# Patient Record
Sex: Male | Born: 1940
Health system: Southern US, Community
[De-identification: ages and names within clinical notes are randomized; demographics above are authoritative.]

## PROBLEM LIST (undated history)

## (undated) DIAGNOSIS — I10 Essential (primary) hypertension: Secondary | ICD-10-CM

## (undated) DIAGNOSIS — C61 Malignant neoplasm of prostate: Secondary | ICD-10-CM

## (undated) DIAGNOSIS — M549 Dorsalgia, unspecified: Secondary | ICD-10-CM

## (undated) DIAGNOSIS — K635 Polyp of colon: Secondary | ICD-10-CM

## (undated) DIAGNOSIS — E785 Hyperlipidemia, unspecified: Secondary | ICD-10-CM

## (undated) DIAGNOSIS — I219 Acute myocardial infarction, unspecified: Secondary | ICD-10-CM

## (undated) DIAGNOSIS — M722 Plantar fascial fibromatosis: Secondary | ICD-10-CM

## (undated) DIAGNOSIS — J449 Chronic obstructive pulmonary disease, unspecified: Secondary | ICD-10-CM

## (undated) DIAGNOSIS — G4700 Insomnia, unspecified: Secondary | ICD-10-CM

## (undated) DIAGNOSIS — F419 Anxiety disorder, unspecified: Secondary | ICD-10-CM

## (undated) DIAGNOSIS — I259 Chronic ischemic heart disease, unspecified: Secondary | ICD-10-CM

## (undated) DIAGNOSIS — J45909 Unspecified asthma, uncomplicated: Secondary | ICD-10-CM

## (undated) DIAGNOSIS — M109 Gout, unspecified: Secondary | ICD-10-CM

## (undated) DIAGNOSIS — K219 Gastro-esophageal reflux disease without esophagitis: Secondary | ICD-10-CM

## (undated) DIAGNOSIS — M542 Cervicalgia: Secondary | ICD-10-CM

## (undated) DIAGNOSIS — K649 Unspecified hemorrhoids: Secondary | ICD-10-CM

## (undated) DIAGNOSIS — I4891 Unspecified atrial fibrillation: Secondary | ICD-10-CM

## (undated) HISTORY — DX: Gastro-esophageal reflux disease without esophagitis: K21.9

## (undated) HISTORY — DX: Gout, unspecified: M10.9

## (undated) HISTORY — DX: Chronic ischemic heart disease, unspecified: I25.9

## (undated) HISTORY — DX: Anxiety disorder, unspecified: F41.9

## (undated) HISTORY — DX: Malignant neoplasm of prostate: C61

## (undated) HISTORY — DX: Acute myocardial infarction, unspecified: I21.9

## (undated) HISTORY — DX: Plantar fascial fibromatosis: M72.2

## (undated) HISTORY — DX: Unspecified asthma, uncomplicated: J45.909

## (undated) HISTORY — DX: Essential (primary) hypertension: I10

## (undated) HISTORY — DX: Unspecified atrial fibrillation: I48.91

## (undated) HISTORY — DX: Dorsalgia, unspecified: M54.9

## (undated) HISTORY — PX: COLONOSCOPY: SHX174

## (undated) HISTORY — DX: Hyperlipidemia, unspecified: E78.5

## (undated) HISTORY — DX: Chronic obstructive pulmonary disease, unspecified: J44.9

## (undated) HISTORY — DX: Cervicalgia: M54.2

---

## 1999-03-06 HISTORY — PX: PROSTATECTOMY: SHX69

## 2001-03-05 DIAGNOSIS — I252 Old myocardial infarction: Secondary | ICD-10-CM

## 2001-03-05 DIAGNOSIS — I219 Acute myocardial infarction, unspecified: Secondary | ICD-10-CM

## 2001-03-05 HISTORY — DX: Acute myocardial infarction, unspecified: I21.9

## 2001-05-27 ENCOUNTER — Ambulatory Visit (HOSPITAL_COMMUNITY): Admission: RE | Admit: 2001-05-27 | Discharge: 2001-05-27 | Payer: Self-pay | Admitting: Cardiology

## 2002-10-19 ENCOUNTER — Inpatient Hospital Stay (HOSPITAL_COMMUNITY): Admission: EM | Admit: 2002-10-19 | Discharge: 2002-10-27 | Payer: Self-pay | Admitting: Internal Medicine

## 2003-06-01 ENCOUNTER — Inpatient Hospital Stay (HOSPITAL_COMMUNITY): Admission: RE | Admit: 2003-06-01 | Discharge: 2003-06-05 | Payer: Self-pay | Admitting: Urology

## 2005-03-05 HISTORY — PX: CORONARY ANGIOPLASTY WITH STENT PLACEMENT: SHX49

## 2015-03-18 DIAGNOSIS — E7800 Pure hypercholesterolemia, unspecified: Secondary | ICD-10-CM | POA: Diagnosis not present

## 2015-03-18 DIAGNOSIS — I259 Chronic ischemic heart disease, unspecified: Secondary | ICD-10-CM | POA: Diagnosis not present

## 2015-03-18 DIAGNOSIS — J449 Chronic obstructive pulmonary disease, unspecified: Secondary | ICD-10-CM | POA: Diagnosis not present

## 2015-03-18 DIAGNOSIS — Z6829 Body mass index (BMI) 29.0-29.9, adult: Secondary | ICD-10-CM | POA: Diagnosis not present

## 2015-03-31 DIAGNOSIS — L859 Epidermal thickening, unspecified: Secondary | ICD-10-CM | POA: Diagnosis not present

## 2015-03-31 DIAGNOSIS — L853 Xerosis cutis: Secondary | ICD-10-CM | POA: Diagnosis not present

## 2015-03-31 DIAGNOSIS — D2330 Other benign neoplasm of skin of unspecified part of face: Secondary | ICD-10-CM | POA: Diagnosis not present

## 2015-04-21 DIAGNOSIS — Z86010 Personal history of colonic polyps: Secondary | ICD-10-CM | POA: Diagnosis not present

## 2015-05-04 DIAGNOSIS — I252 Old myocardial infarction: Secondary | ICD-10-CM | POA: Diagnosis not present

## 2015-05-04 DIAGNOSIS — Z8546 Personal history of malignant neoplasm of prostate: Secondary | ICD-10-CM | POA: Diagnosis not present

## 2015-05-04 DIAGNOSIS — Z807 Family history of other malignant neoplasms of lymphoid, hematopoietic and related tissues: Secondary | ICD-10-CM | POA: Diagnosis not present

## 2015-05-04 DIAGNOSIS — K5731 Diverticulosis of large intestine without perforation or abscess with bleeding: Secondary | ICD-10-CM | POA: Diagnosis not present

## 2015-05-04 DIAGNOSIS — Z86010 Personal history of colonic polyps: Secondary | ICD-10-CM | POA: Diagnosis not present

## 2015-05-04 DIAGNOSIS — Z79899 Other long term (current) drug therapy: Secondary | ICD-10-CM | POA: Diagnosis not present

## 2015-05-04 DIAGNOSIS — E785 Hyperlipidemia, unspecified: Secondary | ICD-10-CM | POA: Diagnosis not present

## 2015-05-04 DIAGNOSIS — M109 Gout, unspecified: Secondary | ICD-10-CM | POA: Diagnosis not present

## 2015-05-04 DIAGNOSIS — Z955 Presence of coronary angioplasty implant and graft: Secondary | ICD-10-CM | POA: Diagnosis not present

## 2015-05-04 DIAGNOSIS — I10 Essential (primary) hypertension: Secondary | ICD-10-CM | POA: Diagnosis not present

## 2015-05-04 DIAGNOSIS — J45909 Unspecified asthma, uncomplicated: Secondary | ICD-10-CM | POA: Diagnosis not present

## 2015-05-04 DIAGNOSIS — Z7982 Long term (current) use of aspirin: Secondary | ICD-10-CM | POA: Diagnosis not present

## 2015-05-04 DIAGNOSIS — I2510 Atherosclerotic heart disease of native coronary artery without angina pectoris: Secondary | ICD-10-CM | POA: Diagnosis not present

## 2015-05-04 DIAGNOSIS — Z1211 Encounter for screening for malignant neoplasm of colon: Secondary | ICD-10-CM | POA: Diagnosis not present

## 2015-05-04 DIAGNOSIS — F419 Anxiety disorder, unspecified: Secondary | ICD-10-CM | POA: Diagnosis not present

## 2015-05-04 DIAGNOSIS — Z8489 Family history of other specified conditions: Secondary | ICD-10-CM | POA: Diagnosis not present

## 2015-05-04 DIAGNOSIS — K644 Residual hemorrhoidal skin tags: Secondary | ICD-10-CM | POA: Diagnosis not present

## 2015-05-04 DIAGNOSIS — Z800 Family history of malignant neoplasm of digestive organs: Secondary | ICD-10-CM | POA: Diagnosis not present

## 2015-05-04 DIAGNOSIS — D122 Benign neoplasm of ascending colon: Secondary | ICD-10-CM | POA: Diagnosis not present

## 2015-05-04 DIAGNOSIS — D120 Benign neoplasm of cecum: Secondary | ICD-10-CM | POA: Diagnosis not present

## 2015-05-04 DIAGNOSIS — K5730 Diverticulosis of large intestine without perforation or abscess without bleeding: Secondary | ICD-10-CM | POA: Diagnosis not present

## 2015-05-04 DIAGNOSIS — Z7951 Long term (current) use of inhaled steroids: Secondary | ICD-10-CM | POA: Diagnosis not present

## 2015-05-04 DIAGNOSIS — D124 Benign neoplasm of descending colon: Secondary | ICD-10-CM | POA: Diagnosis not present

## 2015-05-04 DIAGNOSIS — K642 Third degree hemorrhoids: Secondary | ICD-10-CM | POA: Diagnosis not present

## 2015-05-04 DIAGNOSIS — D123 Benign neoplasm of transverse colon: Secondary | ICD-10-CM | POA: Diagnosis not present

## 2015-06-29 DIAGNOSIS — J449 Chronic obstructive pulmonary disease, unspecified: Secondary | ICD-10-CM | POA: Diagnosis not present

## 2015-06-29 DIAGNOSIS — I10 Essential (primary) hypertension: Secondary | ICD-10-CM | POA: Diagnosis not present

## 2015-06-29 DIAGNOSIS — I2510 Atherosclerotic heart disease of native coronary artery without angina pectoris: Secondary | ICD-10-CM | POA: Diagnosis not present

## 2015-06-29 DIAGNOSIS — I4891 Unspecified atrial fibrillation: Secondary | ICD-10-CM | POA: Diagnosis not present

## 2015-08-24 DIAGNOSIS — I2510 Atherosclerotic heart disease of native coronary artery without angina pectoris: Secondary | ICD-10-CM | POA: Diagnosis not present

## 2015-08-24 DIAGNOSIS — J449 Chronic obstructive pulmonary disease, unspecified: Secondary | ICD-10-CM | POA: Diagnosis not present

## 2015-08-24 DIAGNOSIS — I4891 Unspecified atrial fibrillation: Secondary | ICD-10-CM | POA: Diagnosis not present

## 2015-08-24 DIAGNOSIS — I10 Essential (primary) hypertension: Secondary | ICD-10-CM | POA: Diagnosis not present

## 2015-09-13 DIAGNOSIS — I2510 Atherosclerotic heart disease of native coronary artery without angina pectoris: Secondary | ICD-10-CM | POA: Diagnosis not present

## 2015-09-13 DIAGNOSIS — I10 Essential (primary) hypertension: Secondary | ICD-10-CM | POA: Diagnosis not present

## 2015-09-13 DIAGNOSIS — J449 Chronic obstructive pulmonary disease, unspecified: Secondary | ICD-10-CM | POA: Diagnosis not present

## 2015-09-13 DIAGNOSIS — I4891 Unspecified atrial fibrillation: Secondary | ICD-10-CM | POA: Diagnosis not present

## 2015-09-21 DIAGNOSIS — Z1211 Encounter for screening for malignant neoplasm of colon: Secondary | ICD-10-CM | POA: Diagnosis not present

## 2015-09-21 DIAGNOSIS — Z1389 Encounter for screening for other disorder: Secondary | ICD-10-CM | POA: Diagnosis not present

## 2015-09-21 DIAGNOSIS — Z299 Encounter for prophylactic measures, unspecified: Secondary | ICD-10-CM | POA: Diagnosis not present

## 2015-09-21 DIAGNOSIS — Z6828 Body mass index (BMI) 28.0-28.9, adult: Secondary | ICD-10-CM | POA: Diagnosis not present

## 2015-09-21 DIAGNOSIS — J449 Chronic obstructive pulmonary disease, unspecified: Secondary | ICD-10-CM | POA: Diagnosis not present

## 2015-09-21 DIAGNOSIS — Z7189 Other specified counseling: Secondary | ICD-10-CM | POA: Diagnosis not present

## 2015-09-21 DIAGNOSIS — R5383 Other fatigue: Secondary | ICD-10-CM | POA: Diagnosis not present

## 2015-09-21 DIAGNOSIS — Z125 Encounter for screening for malignant neoplasm of prostate: Secondary | ICD-10-CM | POA: Diagnosis not present

## 2015-09-21 DIAGNOSIS — Z0000 Encounter for general adult medical examination without abnormal findings: Secondary | ICD-10-CM | POA: Diagnosis not present

## 2015-09-21 DIAGNOSIS — Z79899 Other long term (current) drug therapy: Secondary | ICD-10-CM | POA: Diagnosis not present

## 2015-09-21 DIAGNOSIS — I517 Cardiomegaly: Secondary | ICD-10-CM | POA: Diagnosis not present

## 2015-09-21 DIAGNOSIS — E7800 Pure hypercholesterolemia, unspecified: Secondary | ICD-10-CM | POA: Diagnosis not present

## 2015-09-21 DIAGNOSIS — R079 Chest pain, unspecified: Secondary | ICD-10-CM | POA: Diagnosis not present

## 2015-10-26 DIAGNOSIS — I2510 Atherosclerotic heart disease of native coronary artery without angina pectoris: Secondary | ICD-10-CM | POA: Diagnosis not present

## 2015-10-26 DIAGNOSIS — I4891 Unspecified atrial fibrillation: Secondary | ICD-10-CM | POA: Diagnosis not present

## 2015-10-26 DIAGNOSIS — I10 Essential (primary) hypertension: Secondary | ICD-10-CM | POA: Diagnosis not present

## 2015-10-26 DIAGNOSIS — J449 Chronic obstructive pulmonary disease, unspecified: Secondary | ICD-10-CM | POA: Diagnosis not present

## 2015-12-01 DIAGNOSIS — Z23 Encounter for immunization: Secondary | ICD-10-CM | POA: Diagnosis not present

## 2015-12-02 DIAGNOSIS — I2510 Atherosclerotic heart disease of native coronary artery without angina pectoris: Secondary | ICD-10-CM | POA: Diagnosis not present

## 2015-12-02 DIAGNOSIS — J449 Chronic obstructive pulmonary disease, unspecified: Secondary | ICD-10-CM | POA: Diagnosis not present

## 2015-12-02 DIAGNOSIS — I4891 Unspecified atrial fibrillation: Secondary | ICD-10-CM | POA: Diagnosis not present

## 2015-12-02 DIAGNOSIS — I10 Essential (primary) hypertension: Secondary | ICD-10-CM | POA: Diagnosis not present

## 2015-12-21 DIAGNOSIS — I10 Essential (primary) hypertension: Secondary | ICD-10-CM | POA: Diagnosis not present

## 2015-12-21 DIAGNOSIS — J449 Chronic obstructive pulmonary disease, unspecified: Secondary | ICD-10-CM | POA: Diagnosis not present

## 2015-12-21 DIAGNOSIS — I2510 Atherosclerotic heart disease of native coronary artery without angina pectoris: Secondary | ICD-10-CM | POA: Diagnosis not present

## 2015-12-21 DIAGNOSIS — I4891 Unspecified atrial fibrillation: Secondary | ICD-10-CM | POA: Diagnosis not present

## 2015-12-22 DIAGNOSIS — I4891 Unspecified atrial fibrillation: Secondary | ICD-10-CM | POA: Diagnosis not present

## 2015-12-22 DIAGNOSIS — J449 Chronic obstructive pulmonary disease, unspecified: Secondary | ICD-10-CM | POA: Diagnosis not present

## 2015-12-22 DIAGNOSIS — I208 Other forms of angina pectoris: Secondary | ICD-10-CM | POA: Diagnosis not present

## 2015-12-22 DIAGNOSIS — Z6827 Body mass index (BMI) 27.0-27.9, adult: Secondary | ICD-10-CM | POA: Diagnosis not present

## 2015-12-22 DIAGNOSIS — Z299 Encounter for prophylactic measures, unspecified: Secondary | ICD-10-CM | POA: Diagnosis not present

## 2016-01-13 DIAGNOSIS — I4891 Unspecified atrial fibrillation: Secondary | ICD-10-CM | POA: Diagnosis not present

## 2016-01-13 DIAGNOSIS — J449 Chronic obstructive pulmonary disease, unspecified: Secondary | ICD-10-CM | POA: Diagnosis not present

## 2016-01-13 DIAGNOSIS — I2510 Atherosclerotic heart disease of native coronary artery without angina pectoris: Secondary | ICD-10-CM | POA: Diagnosis not present

## 2016-01-13 DIAGNOSIS — I10 Essential (primary) hypertension: Secondary | ICD-10-CM | POA: Diagnosis not present

## 2016-01-30 DIAGNOSIS — M25539 Pain in unspecified wrist: Secondary | ICD-10-CM | POA: Diagnosis not present

## 2016-07-20 DIAGNOSIS — I2510 Atherosclerotic heart disease of native coronary artery without angina pectoris: Secondary | ICD-10-CM | POA: Diagnosis not present

## 2016-07-20 DIAGNOSIS — Z87891 Personal history of nicotine dependence: Secondary | ICD-10-CM | POA: Diagnosis not present

## 2016-07-20 DIAGNOSIS — R0602 Shortness of breath: Secondary | ICD-10-CM | POA: Diagnosis not present

## 2016-07-20 DIAGNOSIS — Z8546 Personal history of malignant neoplasm of prostate: Secondary | ICD-10-CM | POA: Diagnosis not present

## 2016-07-20 DIAGNOSIS — Z7951 Long term (current) use of inhaled steroids: Secondary | ICD-10-CM | POA: Diagnosis not present

## 2016-07-20 DIAGNOSIS — Z79899 Other long term (current) drug therapy: Secondary | ICD-10-CM | POA: Diagnosis not present

## 2016-07-20 DIAGNOSIS — R05 Cough: Secondary | ICD-10-CM | POA: Diagnosis not present

## 2016-07-20 DIAGNOSIS — I4510 Unspecified right bundle-branch block: Secondary | ICD-10-CM | POA: Diagnosis not present

## 2016-07-20 DIAGNOSIS — Z7982 Long term (current) use of aspirin: Secondary | ICD-10-CM | POA: Diagnosis not present

## 2016-07-20 DIAGNOSIS — I10 Essential (primary) hypertension: Secondary | ICD-10-CM | POA: Diagnosis not present

## 2016-07-20 DIAGNOSIS — J441 Chronic obstructive pulmonary disease with (acute) exacerbation: Secondary | ICD-10-CM | POA: Diagnosis not present

## 2016-07-20 DIAGNOSIS — E785 Hyperlipidemia, unspecified: Secondary | ICD-10-CM | POA: Diagnosis not present

## 2016-07-20 DIAGNOSIS — I4891 Unspecified atrial fibrillation: Secondary | ICD-10-CM | POA: Diagnosis not present

## 2016-07-23 DIAGNOSIS — I4891 Unspecified atrial fibrillation: Secondary | ICD-10-CM | POA: Diagnosis not present

## 2016-07-23 DIAGNOSIS — J449 Chronic obstructive pulmonary disease, unspecified: Secondary | ICD-10-CM | POA: Diagnosis not present

## 2016-07-23 DIAGNOSIS — Z87891 Personal history of nicotine dependence: Secondary | ICD-10-CM | POA: Diagnosis not present

## 2016-07-23 DIAGNOSIS — E785 Hyperlipidemia, unspecified: Secondary | ICD-10-CM | POA: Diagnosis not present

## 2016-07-23 DIAGNOSIS — Z299 Encounter for prophylactic measures, unspecified: Secondary | ICD-10-CM | POA: Diagnosis not present

## 2016-07-23 DIAGNOSIS — J441 Chronic obstructive pulmonary disease with (acute) exacerbation: Secondary | ICD-10-CM | POA: Diagnosis not present

## 2016-07-23 DIAGNOSIS — R002 Palpitations: Secondary | ICD-10-CM | POA: Diagnosis not present

## 2016-07-23 DIAGNOSIS — I10 Essential (primary) hypertension: Secondary | ICD-10-CM | POA: Diagnosis not present

## 2016-07-23 DIAGNOSIS — Z713 Dietary counseling and surveillance: Secondary | ICD-10-CM | POA: Diagnosis not present

## 2016-07-23 DIAGNOSIS — Z6827 Body mass index (BMI) 27.0-27.9, adult: Secondary | ICD-10-CM | POA: Diagnosis not present

## 2016-07-25 DIAGNOSIS — C44319 Basal cell carcinoma of skin of other parts of face: Secondary | ICD-10-CM | POA: Diagnosis not present

## 2016-07-25 DIAGNOSIS — Z6827 Body mass index (BMI) 27.0-27.9, adult: Secondary | ICD-10-CM | POA: Diagnosis not present

## 2016-07-25 DIAGNOSIS — L989 Disorder of the skin and subcutaneous tissue, unspecified: Secondary | ICD-10-CM | POA: Diagnosis not present

## 2016-07-25 DIAGNOSIS — L720 Epidermal cyst: Secondary | ICD-10-CM | POA: Diagnosis not present

## 2016-07-25 DIAGNOSIS — Z299 Encounter for prophylactic measures, unspecified: Secondary | ICD-10-CM | POA: Diagnosis not present

## 2016-07-26 DIAGNOSIS — F419 Anxiety disorder, unspecified: Secondary | ICD-10-CM | POA: Diagnosis not present

## 2016-07-26 DIAGNOSIS — Z8546 Personal history of malignant neoplasm of prostate: Secondary | ICD-10-CM | POA: Diagnosis not present

## 2016-07-26 DIAGNOSIS — I252 Old myocardial infarction: Secondary | ICD-10-CM | POA: Diagnosis not present

## 2016-07-26 DIAGNOSIS — R002 Palpitations: Secondary | ICD-10-CM | POA: Diagnosis not present

## 2016-07-26 DIAGNOSIS — I4891 Unspecified atrial fibrillation: Secondary | ICD-10-CM | POA: Diagnosis not present

## 2016-07-26 DIAGNOSIS — J45909 Unspecified asthma, uncomplicated: Secondary | ICD-10-CM | POA: Diagnosis not present

## 2016-07-26 DIAGNOSIS — Z299 Encounter for prophylactic measures, unspecified: Secondary | ICD-10-CM | POA: Diagnosis not present

## 2016-07-26 DIAGNOSIS — I10 Essential (primary) hypertension: Secondary | ICD-10-CM | POA: Diagnosis not present

## 2016-07-26 DIAGNOSIS — Z79899 Other long term (current) drug therapy: Secondary | ICD-10-CM | POA: Diagnosis not present

## 2016-07-26 DIAGNOSIS — Z7982 Long term (current) use of aspirin: Secondary | ICD-10-CM | POA: Diagnosis not present

## 2016-07-26 DIAGNOSIS — Z6827 Body mass index (BMI) 27.0-27.9, adult: Secondary | ICD-10-CM | POA: Diagnosis not present

## 2016-07-26 DIAGNOSIS — I2510 Atherosclerotic heart disease of native coronary artery without angina pectoris: Secondary | ICD-10-CM | POA: Diagnosis not present

## 2016-07-26 DIAGNOSIS — J449 Chronic obstructive pulmonary disease, unspecified: Secondary | ICD-10-CM | POA: Diagnosis not present

## 2016-07-26 DIAGNOSIS — R0602 Shortness of breath: Secondary | ICD-10-CM | POA: Diagnosis not present

## 2016-07-26 DIAGNOSIS — E785 Hyperlipidemia, unspecified: Secondary | ICD-10-CM | POA: Diagnosis not present

## 2016-07-28 DIAGNOSIS — I8391 Asymptomatic varicose veins of right lower extremity: Secondary | ICD-10-CM | POA: Diagnosis not present

## 2016-07-28 DIAGNOSIS — Z79899 Other long term (current) drug therapy: Secondary | ICD-10-CM | POA: Diagnosis not present

## 2016-07-28 DIAGNOSIS — Z8639 Personal history of other endocrine, nutritional and metabolic disease: Secondary | ICD-10-CM | POA: Diagnosis not present

## 2016-07-28 DIAGNOSIS — F329 Major depressive disorder, single episode, unspecified: Secondary | ICD-10-CM | POA: Diagnosis not present

## 2016-07-28 DIAGNOSIS — J45909 Unspecified asthma, uncomplicated: Secondary | ICD-10-CM | POA: Diagnosis not present

## 2016-07-28 DIAGNOSIS — I8393 Asymptomatic varicose veins of bilateral lower extremities: Secondary | ICD-10-CM | POA: Diagnosis not present

## 2016-07-28 DIAGNOSIS — Z7951 Long term (current) use of inhaled steroids: Secondary | ICD-10-CM | POA: Diagnosis not present

## 2016-07-28 DIAGNOSIS — E785 Hyperlipidemia, unspecified: Secondary | ICD-10-CM | POA: Diagnosis not present

## 2016-07-28 DIAGNOSIS — Z7982 Long term (current) use of aspirin: Secondary | ICD-10-CM | POA: Diagnosis not present

## 2016-07-28 DIAGNOSIS — I10 Essential (primary) hypertension: Secondary | ICD-10-CM | POA: Diagnosis not present

## 2016-07-28 DIAGNOSIS — I4891 Unspecified atrial fibrillation: Secondary | ICD-10-CM | POA: Diagnosis not present

## 2016-07-28 DIAGNOSIS — Z8546 Personal history of malignant neoplasm of prostate: Secondary | ICD-10-CM | POA: Diagnosis not present

## 2016-07-28 DIAGNOSIS — I2510 Atherosclerotic heart disease of native coronary artery without angina pectoris: Secondary | ICD-10-CM | POA: Diagnosis not present

## 2016-08-06 DIAGNOSIS — I480 Paroxysmal atrial fibrillation: Secondary | ICD-10-CM | POA: Diagnosis not present

## 2016-08-13 DIAGNOSIS — Z6826 Body mass index (BMI) 26.0-26.9, adult: Secondary | ICD-10-CM | POA: Diagnosis not present

## 2016-08-13 DIAGNOSIS — F419 Anxiety disorder, unspecified: Secondary | ICD-10-CM | POA: Diagnosis not present

## 2016-08-13 DIAGNOSIS — G4700 Insomnia, unspecified: Secondary | ICD-10-CM | POA: Diagnosis not present

## 2016-08-13 DIAGNOSIS — Z299 Encounter for prophylactic measures, unspecified: Secondary | ICD-10-CM | POA: Diagnosis not present

## 2016-08-13 DIAGNOSIS — Z713 Dietary counseling and surveillance: Secondary | ICD-10-CM | POA: Diagnosis not present

## 2016-08-13 DIAGNOSIS — I10 Essential (primary) hypertension: Secondary | ICD-10-CM | POA: Diagnosis not present

## 2016-08-13 DIAGNOSIS — J449 Chronic obstructive pulmonary disease, unspecified: Secondary | ICD-10-CM | POA: Diagnosis not present

## 2016-08-13 DIAGNOSIS — I4891 Unspecified atrial fibrillation: Secondary | ICD-10-CM | POA: Diagnosis not present

## 2016-08-13 DIAGNOSIS — E785 Hyperlipidemia, unspecified: Secondary | ICD-10-CM | POA: Diagnosis not present

## 2016-08-28 DIAGNOSIS — R079 Chest pain, unspecified: Secondary | ICD-10-CM | POA: Diagnosis not present

## 2016-08-28 DIAGNOSIS — I10 Essential (primary) hypertension: Secondary | ICD-10-CM | POA: Diagnosis not present

## 2016-08-28 DIAGNOSIS — J441 Chronic obstructive pulmonary disease with (acute) exacerbation: Secondary | ICD-10-CM | POA: Diagnosis not present

## 2016-08-28 DIAGNOSIS — I4891 Unspecified atrial fibrillation: Secondary | ICD-10-CM | POA: Diagnosis not present

## 2016-08-28 DIAGNOSIS — Z6827 Body mass index (BMI) 27.0-27.9, adult: Secondary | ICD-10-CM | POA: Diagnosis not present

## 2016-08-28 DIAGNOSIS — Z299 Encounter for prophylactic measures, unspecified: Secondary | ICD-10-CM | POA: Diagnosis not present

## 2016-08-28 DIAGNOSIS — F419 Anxiety disorder, unspecified: Secondary | ICD-10-CM | POA: Diagnosis not present

## 2016-08-28 DIAGNOSIS — E785 Hyperlipidemia, unspecified: Secondary | ICD-10-CM | POA: Diagnosis not present

## 2016-08-28 DIAGNOSIS — J449 Chronic obstructive pulmonary disease, unspecified: Secondary | ICD-10-CM | POA: Diagnosis not present

## 2016-09-03 ENCOUNTER — Encounter: Payer: Self-pay | Admitting: *Deleted

## 2016-09-03 ENCOUNTER — Encounter: Payer: Self-pay | Admitting: Cardiology

## 2016-09-03 NOTE — Progress Notes (Signed)
Cardiology Office Note  Date: 09/04/2016   ID: Jackson Martin, DOB 06-15-1940, MRN [id number]  PCP: Monico Blitz, MD  Consulting Cardiologist: Rozann Lesches, MD   Chief Complaint  Patient presents with  . Atrial Fibrillation    History of Present Illness: Jackson Martin is a 76 y.o. male referred for cardiology consultation by Dr. Manuella Ghazi for the assessment of atrial fibrillation. I reviewed his available records which are limited. He states that he has been seen 3 times in the ER at [facility] since May of this year complaining of shortness of breath. He did not seem to be aware of any specific diagnosis based on these visits, and was actually not entirely certain about why he had been referred to see me today from Dr. Manuella Ghazi.  I reviewed his ECGs which show atrial fibrillation. It is not clear whether this is a newly documented arrhythmia in the recent past but presumably so. I do not have any old tracing for comparison. He has been placed on Cartia XT by Dr. Manuella Ghazi, presumably for heart rate control. Today we discussed atrial fibrillation, options for management, potential associated symptoms, and also risk of stroke.  Recent echocardiogram obtained in June at [facility] is outlined below. LVEF 60-65% with mild left atrial enlargement, mildly sclerotic aortic valve without stenosis, mild mitral and tricuspid regurgitation.  CHADSVASC score is 4. We discussed his risk of stroke with atrial fibrillation, approximately 5% per year on aspirin, and just under 2% per year on an agent such as Eliquis. I did make recommendation for him to consider anticoagulant therapy instead of aspirin, he seemed amenable to this, but we did want to review his baseline lab work first. He does not report any obvious bleeding problems. He is not certain when his last TSH was.  Heart rate recorded at 82 bpm by automatic checked today, but when I had him get up onto the examination table  to listen to his heart, heart rate was approximately 110 bpm. He states that he has been compliant with Cartia XT.  Past Medical History:  Diagnosis Date  . Anxiety   . Asthmatic bronchitis   . Atrial fibrillation ([name])   . Backache   . COPD (chronic obstructive pulmonary disease) ([place])   . Esophageal reflux   . Essential hypertension   . Gout   . Hyperlipidemia   . Ischemic heart disease   . MI (myocardial infarction) ([place]) 2003  . Neck pain   . Plantar fasciitis   . Prostate cancer [facility])     Past Surgical History:  Procedure Laterality Date  . PROSTATECTOMY  2001    Current Outpatient Prescriptions  Medication Sig Dispense Refill  . ALBUTEROL IN Inhale into the lungs.    Marland Kitchen allopurinol (ZYLOPRIM) 300 MG tablet Take 300 mg by mouth daily.    Marland Kitchen aspirin EC 81 MG tablet Take 81 mg by mouth daily.    Marland Kitchen atorvastatin (LIPITOR) 20 MG tablet Take 20 mg by mouth daily at 6 PM.    . citalopram (CELEXA) 20 MG tablet Take 20 mg by mouth daily.    Marland Kitchen diltiazem (CARTIA XT) 180 MG 24 hr capsule Take 180 mg by mouth daily.    . Fluticasone-Salmeterol (ADVAIR) 250-50 MCG/DOSE AEPB Inhale 1 puff into the lungs 2 (two) times daily.    . furosemide (LASIX) 20 MG tablet Take 20 mg by mouth daily as needed.    Marland Kitchen levofloxacin (LEVAQUIN) 500 MG tablet  Take 500 mg by mouth daily.    . mirtazapine (REMERON) 15 MG tablet Take 15 mg by mouth at bedtime.    . montelukast (SINGULAIR) 10 MG tablet Take 10 mg by mouth at bedtime.    . Potassium Chloride (KLOR-CON 10 PO) Take by mouth daily as needed.    . predniSONE (DELTASONE) 50 MG tablet Take 50 mg by mouth daily with breakfast.     No current facility-administered medications for this visit.    Allergies:  Symbicort [budesonide-formoterol fumarate]   Social History: The patient  reports that he has quit smoking. He has never used smokeless tobacco. He reports that he does not drink alcohol or use drugs.   Family History: The patient's family  history includes Heart disease in his maternal aunt, maternal grandfather, maternal grandmother, maternal uncle, and mother; Hypertension in his mother.   ROS:  Please see the history of present illness. Otherwise, complete review of systems is positive for chronic dyspnea exertion with intermittent asthma flares.  All other systems are reviewed and negative.   Physical Exam: VS:  BP 122/82   Pulse 82   Ht 5\' 9"  (1.753 m)   Wt 182 lb (82.6 kg)   SpO2 97%   BMI 26.88 kg/m , BMI Body mass index is 26.88 kg/m.  Wt Readings from Last 3 Encounters:  09/04/16 182 lb (82.6 kg)  07/26/16 183 lb (83 kg)    General: Patient appears comfortable at rest. HEENT: Conjunctiva and lids normal, oropharynx clear. Neck: Supple, no elevated JVP or carotid bruits, no thyromegaly. Lungs: Diminished breath sounds but no wheezing, nonlabored breathing at rest. Cardiac: Irregularly irregular, no S3, soft systolic murmur, no pericardial rub. Abdomen: Soft, nontender, bowel sounds present, no guarding or rebound. Extremities: No pitting edema, distal pulses 2+. Skin: Warm and dry. Musculoskeletal: No kyphosis. Neuropsychiatric: Alert and oriented x3, affect grossly appropriate.  ECG: I personally reviewed the tracing from 07/26/2016 which showed atrial fibrillation at 123 bpm with incomplete right bundle branch block and nonspecific ST changes.  Recent Labwork:  Not yet available for review.  Other Studies Reviewed Today:  Echocardiogram 08/06/2016 [facility]): Normal LV wall thickness with LVEF 60-65%, normal right ventricular contraction, mild left atrial enlargement, mildly sclerotic aortic valve without stenosis, mild mitral regurgitation, mild tricuspid regurgitation, no pericardial effusion.  Assessment and Plan:  1. Persistent atrial fibrillation, duration uncertain, presumably recently documented based on available information. He has been placed on Cartia XT by Dr. Manuella Ghazi for heart  rate control. I have recommended increasing the dose from 180 mg daily to 240 mg daily for better heart rate control given rapid increase in rate by just getting up on to the examination table. We will try to get his most recent lab work and then can consider initiation of DOAC such as Eliquis or Xarelto instead of aspirin for more optimal stroke prophylaxis. Office follow-up arranged to make further plans.  2. History of COPD/asthma, on regular inhalers and currently on prednisone. May also be contributor to problem #1.  3. Essential hypertension, blood pressure well controlled today.  4. Ischemic heart disease based on records from Dr. Manuella Ghazi, details not clear. He denies any exertional, reproducible chest pain.  Current medicines were reviewed with the patient today.   Orders Placed This Encounter  Procedures  . EKG 12-Lead    Disposition: Follow-up in the next 3-4 weeks.  Signed, Satira Sark, MD, [facility] 09/04/2016 1:18 PM    [facility]  at [place], [place], [place] [address] Phone: [phone]; Fax: [phone]

## 2016-09-04 ENCOUNTER — Encounter: Payer: Self-pay | Admitting: Cardiology

## 2016-09-04 ENCOUNTER — Telehealth: Payer: Self-pay | Admitting: Cardiology

## 2016-09-04 ENCOUNTER — Ambulatory Visit (INDEPENDENT_AMBULATORY_CARE_PROVIDER_SITE_OTHER): Payer: Medicare Other | Admitting: Cardiology

## 2016-09-04 VITALS — BP 122/82 | HR 82 | Ht 69.0 in | Wt 182.0 lb

## 2016-09-04 DIAGNOSIS — I4819 Other persistent atrial fibrillation: Secondary | ICD-10-CM

## 2016-09-04 DIAGNOSIS — I259 Chronic ischemic heart disease, unspecified: Secondary | ICD-10-CM | POA: Diagnosis not present

## 2016-09-04 DIAGNOSIS — I481 Persistent atrial fibrillation: Secondary | ICD-10-CM

## 2016-09-04 DIAGNOSIS — I10 Essential (primary) hypertension: Secondary | ICD-10-CM

## 2016-09-04 DIAGNOSIS — J449 Chronic obstructive pulmonary disease, unspecified: Secondary | ICD-10-CM

## 2016-09-04 MED ORDER — DILTIAZEM HCL ER COATED BEADS 240 MG PO CP24: 240.0000 mg | ORAL_CAPSULE | Freq: Every day | ORAL | 3 refills | Status: DC

## 2016-09-04 NOTE — Telephone Encounter (Signed)
Patient not interested at this time. Will discuss in further detail with Dr. Domenic Polite at follow up appointment

## 2016-09-04 NOTE — Telephone Encounter (Signed)
Please see consultation note from today. I was able to review his recent lab work and addended the original note. Renal function and hemoglobin were normal, TSH was also normal. I did talk with him about stroke prophylaxis with anticoagulation in light of persistent atrial fibrillation and CHADSVASC score of 4. If he would like to go ahead and proceed with therapy, we could start him on either Eliquis or Xarelto. Please schedule him a new patient visit in our anticoagulation clinic to make plans from there and I can see him back in the office as already scheduled.

## 2016-09-04 NOTE — Progress Notes (Signed)
Subsequently received lab work from [place] visits at [facility] in May and June. Most recent lab work shows a normal renal function with BUN and creatinine of 9 and 0.98 respectively, potassium 3.7. Hemoglobin also normal at 14.5 with platelets 220. TSH normal at 1.19.

## 2016-09-04 NOTE — Patient Instructions (Signed)
Medication Instructions:  Your physician has recommended you make the following change in your medication:   Begin taking diltiazem 240 mg daily  Labwork: NONE  Testing/Procedures: NONE  Follow-Up: Your physician recommends that you schedule a follow-up appointment in: 2-3 [place]  Any Other Special Instructions Will Be Listed Below (If Applicable).  If you need a refill on your cardiac medications before your next appointment, please call your pharmacy.

## 2016-09-11 DIAGNOSIS — J449 Chronic obstructive pulmonary disease, unspecified: Secondary | ICD-10-CM | POA: Diagnosis not present

## 2016-09-11 DIAGNOSIS — Z299 Encounter for prophylactic measures, unspecified: Secondary | ICD-10-CM | POA: Diagnosis not present

## 2016-09-11 DIAGNOSIS — E785 Hyperlipidemia, unspecified: Secondary | ICD-10-CM | POA: Diagnosis not present

## 2016-09-11 DIAGNOSIS — F419 Anxiety disorder, unspecified: Secondary | ICD-10-CM | POA: Diagnosis not present

## 2016-09-11 DIAGNOSIS — I252 Old myocardial infarction: Secondary | ICD-10-CM | POA: Diagnosis not present

## 2016-09-11 DIAGNOSIS — I4891 Unspecified atrial fibrillation: Secondary | ICD-10-CM | POA: Diagnosis not present

## 2016-09-11 DIAGNOSIS — I10 Essential (primary) hypertension: Secondary | ICD-10-CM | POA: Diagnosis not present

## 2016-09-11 DIAGNOSIS — E7800 Pure hypercholesterolemia, unspecified: Secondary | ICD-10-CM | POA: Diagnosis not present

## 2016-09-11 DIAGNOSIS — Z6827 Body mass index (BMI) 27.0-27.9, adult: Secondary | ICD-10-CM | POA: Diagnosis not present

## 2016-09-11 DIAGNOSIS — J441 Chronic obstructive pulmonary disease with (acute) exacerbation: Secondary | ICD-10-CM | POA: Diagnosis not present

## 2016-09-24 DIAGNOSIS — Z125 Encounter for screening for malignant neoplasm of prostate: Secondary | ICD-10-CM | POA: Diagnosis not present

## 2016-09-24 DIAGNOSIS — Z1211 Encounter for screening for malignant neoplasm of colon: Secondary | ICD-10-CM | POA: Diagnosis not present

## 2016-09-24 DIAGNOSIS — Z299 Encounter for prophylactic measures, unspecified: Secondary | ICD-10-CM | POA: Diagnosis not present

## 2016-09-24 DIAGNOSIS — Z7189 Other specified counseling: Secondary | ICD-10-CM | POA: Diagnosis not present

## 2016-09-24 DIAGNOSIS — R5383 Other fatigue: Secondary | ICD-10-CM | POA: Diagnosis not present

## 2016-09-24 DIAGNOSIS — I4891 Unspecified atrial fibrillation: Secondary | ICD-10-CM | POA: Diagnosis not present

## 2016-09-24 DIAGNOSIS — Z1389 Encounter for screening for other disorder: Secondary | ICD-10-CM | POA: Diagnosis not present

## 2016-09-24 DIAGNOSIS — F419 Anxiety disorder, unspecified: Secondary | ICD-10-CM | POA: Diagnosis not present

## 2016-09-24 DIAGNOSIS — Z79899 Other long term (current) drug therapy: Secondary | ICD-10-CM | POA: Diagnosis not present

## 2016-09-24 DIAGNOSIS — M109 Gout, unspecified: Secondary | ICD-10-CM | POA: Diagnosis not present

## 2016-09-24 DIAGNOSIS — J449 Chronic obstructive pulmonary disease, unspecified: Secondary | ICD-10-CM | POA: Diagnosis not present

## 2016-09-24 DIAGNOSIS — Z6827 Body mass index (BMI) 27.0-27.9, adult: Secondary | ICD-10-CM | POA: Diagnosis not present

## 2016-09-24 DIAGNOSIS — E785 Hyperlipidemia, unspecified: Secondary | ICD-10-CM | POA: Diagnosis not present

## 2016-09-24 DIAGNOSIS — Z0000 Encounter for general adult medical examination without abnormal findings: Secondary | ICD-10-CM | POA: Diagnosis not present

## 2016-09-24 DIAGNOSIS — I10 Essential (primary) hypertension: Secondary | ICD-10-CM | POA: Diagnosis not present

## 2016-09-25 NOTE — Progress Notes (Signed)
Cardiology Office Note  Date: 09/26/2016   ID: Jackson Martin, DOB September 01, 1940, MRN [id number]  PCP: Monico Blitz, MD  Primary Cardiologist: Rozann Lesches, MD   Chief Complaint  Patient presents with  . Atrial Fibrillation    History of Present Illness: Jackson Martin is a 76 y.o. male that I met recently in early July with atrial fibrillation of uncertain duration. He presents today for a follow-up visit. Reports no palpitations or chest pain at this time. It does not appear that his Cartia XT was increased to 240 mg daily which was recommended the last visit, although he is due now to pickup a prescription. I did talk with him today about anticoagulation for stroke prophylaxis. CHADSVASC score of 4. Reviewed his most recent lab work. Our plan is to schedule him in our anticoagulation clinic in anticipation of starting Eliquis.  Past Medical History:  Diagnosis Date  . Anxiety   . Asthmatic bronchitis   . Atrial fibrillation ([place])   . Backache   . COPD (chronic obstructive pulmonary disease) ([place])   . Esophageal reflux   . Essential hypertension   . Gout   . Hyperlipidemia   . Ischemic heart disease   . MI (myocardial infarction) ([place]) 2003  . Neck pain   . Plantar fasciitis   . Prostate cancer [facility])     Past Surgical History:  Procedure Laterality Date  . PROSTATECTOMY  2001    Current Outpatient Prescriptions  Medication Sig Dispense Refill  . ALBUTEROL IN Inhale into the lungs.    Marland Kitchen allopurinol (ZYLOPRIM) 300 MG tablet Take 300 mg by mouth daily.    Marland Kitchen aspirin EC 81 MG tablet Take 81 mg by mouth daily.    Marland Kitchen atorvastatin (LIPITOR) 20 MG tablet Take 20 mg by mouth daily at 6 PM.    . citalopram (CELEXA) 20 MG tablet Take 20 mg by mouth daily.    . Fluticasone-Salmeterol (ADVAIR) 250-50 MCG/DOSE AEPB Inhale 1 puff into the lungs 2 (two) times daily.    . furosemide (LASIX) 20 MG tablet Take 20 mg by mouth daily as needed.    Marland Kitchen levofloxacin (LEVAQUIN) 500 MG  tablet Take 500 mg by mouth daily.    . mirtazapine (REMERON) 15 MG tablet Take 15 mg by mouth at bedtime.    . montelukast (SINGULAIR) 10 MG tablet Take 10 mg by mouth at bedtime.    . Potassium Chloride (KLOR-CON 10 PO) Take by mouth daily as needed.    . diltiazem (CARDIZEM CD) 240 MG 24 hr capsule Take 1 capsule (240 mg total) by mouth daily. 90 capsule 3   No current facility-administered medications for this visit.    Allergies:  Symbicort [budesonide-formoterol fumarate]   Social History: The patient  reports that he has quit smoking. He has never used smokeless tobacco. He reports that he does not drink alcohol or use drugs.   ROS:  Please see the history of present illness. Otherwise, complete review of systems is positive for intermittent asthma flares.  All other systems are reviewed and negative.   Physical Exam: VS:  BP (!) 138/94   Pulse 95   Ht 5' 9" (1.753 m)   Wt 189 lb 6.4 oz (85.9 kg)   SpO2 96% Comment: on room air  BMI 27.97 kg/m , BMI Body mass index is 27.97 kg/m.  Wt Readings from Last 3 Encounters:  09/26/16 189 lb 6.4 oz (85.9 kg)  09/04/16 182 lb (82.6 kg)  07/26/16 183 lb (83 kg)    General: Patient appears comfortable at rest. HEENT: Conjunctiva and lids normal, oropharynx clear. Neck: Supple, no elevated JVP or carotid bruits, no thyromegaly. Lungs: Diminished breath sounds but no wheezing, nonlabored breathing at rest. Cardiac: Irregularly irregular, no S3, soft systolic murmur, no pericardial rub. Abdomen: Soft, nontender, bowel sounds present, no guarding or rebound. Extremities: No pitting edema, distal pulses 2+. Skin: Warm and dry. Musculoskeletal: No kyphosis. Neuropsychiatric: Alert and oriented x3, affect grossly appropriate.  ECG: I personally reviewed the tracing from6/26/2018 which showed atrial fibrillation with incomplete right bundle branch block and nonspecific ST changes.  Recent Labwork:  May 2018: BUN 18, creatinine 1.1, AST  12, ALT 16, potassium 4.6, TSH 1.19, troponin T less than 0.01, NT-proBNP 721, hemoglobin 13.3, platelets 241  Other Studies Reviewed Today:  Echocardiogram 08/06/2016 [facility]): Normal LV wall thickness with LVEF 60-65%, normal right ventricular contraction, mild left atrial enlargement, mildly sclerotic aortic valve without stenosis, mild mitral regurgitation, mild tricuspid regurgitation, no pericardial effusion.  Assessment and Plan:  1. Persistent atrial fibrillation of uncertain duration with CHADSVASC score of 4. Cardia XT will be increased to 240 mg daily, pharmacy informed. Also plan to have him enrolled in our anticoagulation clinic in anticipation of starting Eliquis for stroke prophylaxis.  2. Essential hypertension, keep follow-up with Dr. Manuella Ghazi.  3. History of COPD and asthma. No active symptoms.  4. Ischemic heart disease by history per Dr. Manuella Ghazi, details not clear. He is not reporting any obvious angina. LVEF normal by recent echocardiogram.  Current medicines were reviewed with the patient today.   Disposition: Follow-up in 4 months.  Signed, Satira Sark, MD, [facility] 09/26/2016 12:08 PM    [facility] at [place], [place], [place] [address] Phone: [phone]; Fax: [phone]

## 2016-09-26 ENCOUNTER — Encounter: Payer: Self-pay | Admitting: Cardiology

## 2016-09-26 ENCOUNTER — Ambulatory Visit (INDEPENDENT_AMBULATORY_CARE_PROVIDER_SITE_OTHER): Payer: Medicare Other | Admitting: Cardiology

## 2016-09-26 VITALS — BP 138/94 | HR 95 | Ht 69.0 in | Wt 189.4 lb

## 2016-09-26 DIAGNOSIS — I10 Essential (primary) hypertension: Secondary | ICD-10-CM | POA: Diagnosis not present

## 2016-09-26 DIAGNOSIS — I259 Chronic ischemic heart disease, unspecified: Secondary | ICD-10-CM | POA: Diagnosis not present

## 2016-09-26 DIAGNOSIS — J449 Chronic obstructive pulmonary disease, unspecified: Secondary | ICD-10-CM

## 2016-09-26 DIAGNOSIS — I481 Persistent atrial fibrillation: Secondary | ICD-10-CM

## 2016-09-26 DIAGNOSIS — I4819 Other persistent atrial fibrillation: Secondary | ICD-10-CM

## 2016-09-26 MED ORDER — DILTIAZEM HCL ER COATED BEADS 240 MG PO CP24: 240.0000 mg | ORAL_CAPSULE | Freq: Every day | ORAL | 3 refills | Status: DC

## 2016-09-26 NOTE — Patient Instructions (Signed)
Medication Instructions:  Your physician has recommended you make the following change in your medication:   Begin Diltiazem 240 mg daily  Please continue all other medications as prescribed  Labwork: NONE  Testing/Procedures: NONE  Follow-Up: Your physician recommends that you schedule a follow-up appointment in: [place]  Any Other Special Instructions Will Be Listed Below (If Applicable).  If you need a refill on your cardiac medications before your next appointment, please call your pharmacy.

## 2016-10-01 ENCOUNTER — Telehealth: Payer: Self-pay | Admitting: Cardiology

## 2016-10-01 NOTE — Telephone Encounter (Signed)
Jackson Martin called in regards to his upcoming appointment tomorrow with our coumdin nurse in regards To starting Eliquis.  States that he has done a stool culture and it has blood in it. Wants to know if he Is suppose to keep the appointment tomorrow.

## 2016-10-01 NOTE — Telephone Encounter (Signed)
Pt aware to keep tomorrow's appt

## 2016-10-02 ENCOUNTER — Ambulatory Visit (INDEPENDENT_AMBULATORY_CARE_PROVIDER_SITE_OTHER): Payer: Medicare Other | Admitting: *Deleted

## 2016-10-02 DIAGNOSIS — I259 Chronic ischemic heart disease, unspecified: Secondary | ICD-10-CM

## 2016-10-02 MED ORDER — APIXABAN 5 MG PO TABS: 5.0000 mg | ORAL_TABLET | Freq: Two times a day (BID) | ORAL | 3 refills | Status: DC

## 2016-10-02 NOTE — Progress Notes (Signed)
Pt is here to discuss starting Eliquis as recommended by Dr Domenic Polite.  Eliquis teaching was done.  Pt states he saw PCP several days ago and they called and told him his stool card was heme +.  Pt wants to f/u on potential bleeding before starting Eliquis.  10/08/16  Patient followed up with Dr Manuella Ghazi and he has been referred to Dr Laural Golden for colonoscopy.  Pt will call me back about starting eliquis after test are completed and he gets the results back.  Pt can not afford co-pay of $96 a month after deductible of >$300.00. Has requested an application for pt assistance.  Will see if pt qualifies if not he will try coumadin

## 2016-10-04 ENCOUNTER — Telehealth: Payer: Self-pay | Admitting: *Deleted

## 2016-10-04 NOTE — Telephone Encounter (Signed)
-----  Message from Satira Sark, MD sent at 10/02/2016 11:05 AM EDT ----- Yes, as long as he has appropriate follow-up and realizes to be on the lookout for any significant stool changes or obvious bleeding.  ----- Message ----- From: Malen Gauze, RN Sent: 10/02/2016  10:47 AM To: Satira Sark, MD  Met with pt this morning about starting Eliquis.  States he got a call from Dr Trena Platt office yesterday that stool card was heme positive during recent physical.  Last HGB 13.3 (07/26/16)  Pt denies seeing any blood in urine or stool.  Had colonoscopy last year that was NL per pt.  No follow up was made by Dr Trena Platt office.  Told pt to call office today to see if they plan to repeat stool cards/labs etc. He will let Dr Manuella Ghazi know about plan to start Eliquis.  Pt is willing to start Eliquis if cost allows.  Rx sent to Pharm. Are you OK with him going ahead and starting?  I will be seeing him back and getting repeat BMP/CBC in 1 month.

## 2016-10-04 NOTE — Telephone Encounter (Signed)
Pt is waiting to hear back from Dr Manuella Ghazi to see if he needs further testing before he starts Eliquis.  Pt will call me back.

## 2016-10-08 NOTE — Telephone Encounter (Signed)
Patient called stating he is still waiting to do colonoscopy

## 2016-10-09 NOTE — Telephone Encounter (Signed)
Spoke to pt.  He is waiting to hear back from Dr Olevia Perches office with a date for his colonoscopy.  Pt wants to wait until after results from colonoscopy before he starts Eliquis.  Pt states he will call me back to start Eliquis when test is complete.

## 2016-10-15 ENCOUNTER — Encounter (INDEPENDENT_AMBULATORY_CARE_PROVIDER_SITE_OTHER): Payer: Self-pay | Admitting: Internal Medicine

## 2016-10-19 DIAGNOSIS — I10 Essential (primary) hypertension: Secondary | ICD-10-CM | POA: Diagnosis not present

## 2016-10-19 DIAGNOSIS — Z299 Encounter for prophylactic measures, unspecified: Secondary | ICD-10-CM | POA: Diagnosis not present

## 2016-10-19 DIAGNOSIS — J45909 Unspecified asthma, uncomplicated: Secondary | ICD-10-CM | POA: Diagnosis not present

## 2016-10-19 DIAGNOSIS — Z789 Other specified health status: Secondary | ICD-10-CM | POA: Diagnosis not present

## 2016-10-19 DIAGNOSIS — I4891 Unspecified atrial fibrillation: Secondary | ICD-10-CM | POA: Diagnosis not present

## 2016-10-19 DIAGNOSIS — Z6827 Body mass index (BMI) 27.0-27.9, adult: Secondary | ICD-10-CM | POA: Diagnosis not present

## 2016-10-19 DIAGNOSIS — F419 Anxiety disorder, unspecified: Secondary | ICD-10-CM | POA: Diagnosis not present

## 2016-10-19 DIAGNOSIS — Z713 Dietary counseling and surveillance: Secondary | ICD-10-CM | POA: Diagnosis not present

## 2016-10-19 DIAGNOSIS — J441 Chronic obstructive pulmonary disease with (acute) exacerbation: Secondary | ICD-10-CM | POA: Diagnosis not present

## 2016-10-29 ENCOUNTER — Telehealth (INDEPENDENT_AMBULATORY_CARE_PROVIDER_SITE_OTHER): Payer: Self-pay | Admitting: *Deleted

## 2016-10-29 ENCOUNTER — Other Ambulatory Visit (INDEPENDENT_AMBULATORY_CARE_PROVIDER_SITE_OTHER): Payer: Self-pay | Admitting: *Deleted

## 2016-10-29 ENCOUNTER — Encounter (INDEPENDENT_AMBULATORY_CARE_PROVIDER_SITE_OTHER): Payer: Self-pay | Admitting: *Deleted

## 2016-10-29 DIAGNOSIS — K922 Gastrointestinal hemorrhage, unspecified: Secondary | ICD-10-CM

## 2016-10-29 MED ORDER — PEG 3350-KCL-NA BICARB-NACL 420 G PO SOLR: 4000.0000 mL | Freq: Once | ORAL | 0 refills | Status: AC

## 2016-10-29 NOTE — Telephone Encounter (Signed)
  Procedure: tcs  Reason/Indication:  GI Bleed, fam hx colon ca  Has patient had this procedure before?  yes, 2017 -- dr Britta Mccreedy   If so, when, by whom and where?    Is there a family history of colon cancer?   yes, brother  Who?  What age when diagnosed?    Is patient diabetic?   no      Does patient have prosthetic heart valve or mechanical valve?  no  Do you have a pacemaker?  no  Has patient ever had endocarditis?  no  Has patient had joint replacement within last 12 months?  no  Does patient tend to be constipated or take laxatives? no  Do you have a history of alcohol/drug use? no  Is patient on Coumadin, Plavix and/or Aspirin? yes    Medications: asa 81 mg daily, allopurinol 300 mg daily, atorvastatin 20 mg daily, cartia 240 mg daily, montelukast 10 mg daily, mirtazapine 15 mg daily, furosemide 20 mg 1 tab prn, potassium 10 mg 1 tab prn,   Allergies: nkda  PHARMACY: [facility]  Medication adjustment: asa 2 days  PROCEDURE DATE & TIME: 11/15/16 at 1200 (1100)

## 2016-10-29 NOTE — Telephone Encounter (Signed)
agree

## 2016-10-29 NOTE — Telephone Encounter (Signed)
Patient needs trilyte 

## 2016-10-31 ENCOUNTER — Ambulatory Visit (INDEPENDENT_AMBULATORY_CARE_PROVIDER_SITE_OTHER): Payer: Medicare Other | Admitting: Internal Medicine

## 2016-11-08 DIAGNOSIS — Z23 Encounter for immunization: Secondary | ICD-10-CM | POA: Diagnosis not present

## 2016-11-09 ENCOUNTER — Telehealth: Payer: Self-pay | Admitting: *Deleted

## 2016-11-09 MED ORDER — APIXABAN 5 MG PO TABS: 5.0000 mg | ORAL_TABLET | Freq: Two times a day (BID) | ORAL | 4 refills | Status: DC

## 2016-11-15 ENCOUNTER — Ambulatory Visit (HOSPITAL_COMMUNITY)
Admission: RE | Admit: 2016-11-15 | Discharge: 2016-11-15 | Disposition: A | Discharge status: Home / Self Care | Payer: Medicare Other | Source: Ambulatory Visit | Attending: Internal Medicine | Admitting: Internal Medicine

## 2016-11-15 ENCOUNTER — Encounter (HOSPITAL_COMMUNITY): Payer: Self-pay | Admitting: *Deleted

## 2016-11-15 ENCOUNTER — Encounter (HOSPITAL_COMMUNITY)
Admission: RE | Disposition: A | Discharge status: Home / Self Care | Payer: Self-pay | Source: Ambulatory Visit | Attending: Internal Medicine

## 2016-11-15 DIAGNOSIS — J449 Chronic obstructive pulmonary disease, unspecified: Secondary | ICD-10-CM | POA: Insufficient documentation

## 2016-11-15 DIAGNOSIS — K219 Gastro-esophageal reflux disease without esophagitis: Secondary | ICD-10-CM | POA: Insufficient documentation

## 2016-11-15 DIAGNOSIS — Z888 Allergy status to other drugs, medicaments and biological substances status: Secondary | ICD-10-CM | POA: Diagnosis not present

## 2016-11-15 DIAGNOSIS — Z86010 Personal history of colonic polyps: Secondary | ICD-10-CM | POA: Insufficient documentation

## 2016-11-15 DIAGNOSIS — K922 Gastrointestinal hemorrhage, unspecified: Secondary | ICD-10-CM | POA: Insufficient documentation

## 2016-11-15 DIAGNOSIS — I252 Old myocardial infarction: Secondary | ICD-10-CM | POA: Insufficient documentation

## 2016-11-15 DIAGNOSIS — Z8249 Family history of ischemic heart disease and other diseases of the circulatory system: Secondary | ICD-10-CM | POA: Diagnosis not present

## 2016-11-15 DIAGNOSIS — E785 Hyperlipidemia, unspecified: Secondary | ICD-10-CM | POA: Diagnosis not present

## 2016-11-15 DIAGNOSIS — K644 Residual hemorrhoidal skin tags: Secondary | ICD-10-CM

## 2016-11-15 DIAGNOSIS — Z87891 Personal history of nicotine dependence: Secondary | ICD-10-CM | POA: Insufficient documentation

## 2016-11-15 DIAGNOSIS — R195 Other fecal abnormalities: Secondary | ICD-10-CM | POA: Insufficient documentation

## 2016-11-15 DIAGNOSIS — I4891 Unspecified atrial fibrillation: Secondary | ICD-10-CM | POA: Diagnosis not present

## 2016-11-15 DIAGNOSIS — Z7901 Long term (current) use of anticoagulants: Secondary | ICD-10-CM | POA: Diagnosis not present

## 2016-11-15 DIAGNOSIS — F419 Anxiety disorder, unspecified: Secondary | ICD-10-CM | POA: Diagnosis not present

## 2016-11-15 DIAGNOSIS — M109 Gout, unspecified: Secondary | ICD-10-CM | POA: Diagnosis not present

## 2016-11-15 DIAGNOSIS — I259 Chronic ischemic heart disease, unspecified: Secondary | ICD-10-CM | POA: Insufficient documentation

## 2016-11-15 DIAGNOSIS — D123 Benign neoplasm of transverse colon: Secondary | ICD-10-CM | POA: Insufficient documentation

## 2016-11-15 DIAGNOSIS — Z7982 Long term (current) use of aspirin: Secondary | ICD-10-CM | POA: Diagnosis not present

## 2016-11-15 DIAGNOSIS — K5730 Diverticulosis of large intestine without perforation or abscess without bleeding: Secondary | ICD-10-CM | POA: Insufficient documentation

## 2016-11-15 DIAGNOSIS — Z8546 Personal history of malignant neoplasm of prostate: Secondary | ICD-10-CM | POA: Insufficient documentation

## 2016-11-15 DIAGNOSIS — I10 Essential (primary) hypertension: Secondary | ICD-10-CM | POA: Insufficient documentation

## 2016-11-15 DIAGNOSIS — Z79899 Other long term (current) drug therapy: Secondary | ICD-10-CM | POA: Diagnosis not present

## 2016-11-15 DIAGNOSIS — D125 Benign neoplasm of sigmoid colon: Secondary | ICD-10-CM | POA: Diagnosis not present

## 2016-11-15 DIAGNOSIS — D1779 Benign lipomatous neoplasm of other sites: Secondary | ICD-10-CM | POA: Insufficient documentation

## 2016-11-15 HISTORY — PX: POLYPECTOMY: SHX5525

## 2016-11-15 HISTORY — PX: COLONOSCOPY: SHX5424

## 2016-11-15 HISTORY — DX: Polyp of colon: K63.5

## 2016-11-15 SURGERY — COLONOSCOPY
Anesthesia: Moderate Sedation

## 2016-11-15 MED ORDER — DILTIAZEM HCL 25 MG/5ML IV SOLN
20.0000 mg | Freq: Once | INTRAVENOUS | Status: AC
  Administered 2016-11-15: 20 mg via INTRAVENOUS
  Filled 2016-11-15: qty 5

## 2016-11-15 MED ORDER — SODIUM CHLORIDE 0.9 % IV SOLN
INTRAVENOUS | Status: DC
  Administered 2016-11-15: 20 mL/h via INTRAVENOUS

## 2016-11-15 MED ORDER — MEPERIDINE HCL 50 MG/ML IJ SOLN
INTRAMUSCULAR | Status: AC
  Filled 2016-11-15: qty 1

## 2016-11-15 MED ORDER — MEPERIDINE HCL 50 MG/ML IJ SOLN
INTRAMUSCULAR | Status: DC | PRN
  Administered 2016-11-15 (×2): 25 mg

## 2016-11-15 MED ORDER — MIDAZOLAM HCL 5 MG/5ML IJ SOLN
INTRAMUSCULAR | Status: DC | PRN
  Administered 2016-11-15 (×2): 2 mg via INTRAVENOUS
  Administered 2016-11-15: 1 mg via INTRAVENOUS

## 2016-11-15 MED ORDER — MIDAZOLAM HCL 5 MG/5ML IJ SOLN
INTRAMUSCULAR | Status: AC
  Filled 2016-11-15: qty 10

## 2016-11-15 MED ORDER — STERILE WATER FOR IRRIGATION IR SOLN
Status: DC | PRN
  Administered 2016-11-15: 12:00:00

## 2016-11-15 NOTE — H&P (Signed)
Jackson Martin is an 76 y.o. male.   Chief Complaint: Patient is here for colonoscopy. HPI: patient is 76 year old Caucasian male ho has history of multiple colonic adenomas. His last exam was in March 2017. He had Hemoccult by his primary care physician and was positive.It was therefore decided to proceed with colonoscopy because of history of multiple adenomatous. He denies abdominal pain melena or rectal bleeding. Last dose of aspirin was 2 days ago. Dr. Domenic Polite is planning to start him on Eliquis when GI evaluation complete.  Past Medical History:  Diagnosis Date  . Anxiety   . Asthmatic bronchitis   . Atrial fibrillation ([place])   . Backache   . Colon polyps   . COPD (chronic obstructive pulmonary disease) ([place])   . Dyspnea   . Esophageal reflux   . Essential hypertension   . Gout   . Hyperlipidemia   . Ischemic heart disease   . MI (myocardial infarction) ([place]) 2003  . Neck pain   . Plantar fasciitis   . Prostate cancer [facility])     Past Surgical History:  Procedure Laterality Date  . COLONOSCOPY    . PROSTATECTOMY  2001    Family History  Problem Relation Age of Onset  . Hypertension Mother   . Heart disease Mother   . Heart disease Maternal Aunt   . Heart disease Maternal Uncle   . Heart disease Maternal Grandmother   . Heart disease Maternal Grandfather   . Colon cancer Brother    Social History:  reports that he has quit smoking. He has never used smokeless tobacco. He reports that he does not drink alcohol or use drugs.  Allergies:  Allergies  Allergen Reactions  . Symbicort [Budesonide-Formoterol Fumarate] Shortness Of Breath    Medications Prior to Admission  Medication Sig Dispense Refill  . albuterol (PROVENTIL) (2.5 MG/3ML) 0.083% nebulizer solution Take 2.5 mg by nebulization every 6 (six) hours as needed for wheezing or shortness of breath.    . allopurinol (ZYLOPRIM) 300 MG tablet Take 300 mg by mouth 2 (two) times daily.     Marland Kitchen aspirin EC 81 MG  tablet Take 81 mg by mouth at bedtime.     Marland Kitchen atorvastatin (LIPITOR) 20 MG tablet Take 20 mg by mouth at bedtime.     . citalopram (CELEXA) 20 MG tablet Take 20 mg by mouth daily with lunch.     . clonazePAM (KLONOPIN) 0.5 MG tablet Take 0.5 mg by mouth 2 (two) times daily as needed. For anxiety    . diltiazem (CARDIZEM CD) 240 MG 24 hr capsule Take 1 capsule (240 mg total) by mouth daily. 90 capsule 3  . Fluticasone-Salmeterol (ADVAIR) 250-50 MCG/DOSE AEPB Inhale 1 puff into the lungs 2 (two) times daily.    . furosemide (LASIX) 20 MG tablet Take 20 mg by mouth daily as needed (for fluid retention.).     Marland Kitchen mirtazapine (REMERON) 15 MG tablet Take 15 mg by mouth at bedtime.    . montelukast (SINGULAIR) 10 MG tablet Take 10 mg by mouth at bedtime.    . Potassium Chloride (KLOR-CON 10 PO) Take 10 mEq by mouth daily as needed (FOR FLUID RETENTION/WITH LASIX).     Marland Kitchen apixaban (ELIQUIS) 5 MG TABS tablet Take 1 tablet (5 mg total) by mouth 2 (two) times daily. 180 tablet 4    No results found for this or any previous visit (from the past 48 hour(s)). No results found.  ROS  Blood pressure (!) 151/108,  pulse (!) 120, temperature 97.7 F (36.5 C), temperature source Oral, resp. rate (!) 26, SpO2 97 %. Physical Exam  Constitutional: He appears well-developed and well-nourished.  HENT:  Mouth/Throat: Oropharynx is clear and moist.  Eyes: Conjunctivae are normal. No scleral icterus.  Neck: No thyromegaly present.  Cardiovascular:  Irregular rhythm normal S1 and S2. No murmur or gallop noted.  Respiratory: Effort normal and breath sounds normal.  GI:  Abdomen is full. It is soft and nontender without organomegaly or masses.  Musculoskeletal: He exhibits no edema.  Lymphadenopathy:    He has no cervical adenopathy.  Neurological: He is alert.  Skin: Skin is warm and dry.     Assessment/Plan Heme positive stool. History of colonic adenomas. Diagnostic colonoscopy.  Hildred Laser,  MD 11/15/2016, 11:48 AM

## 2016-11-15 NOTE — OR Nursing (Signed)
Dr. Laural Golden notified of patient's BP 151/108 and 132/112, heart rate in the 120's-atrial fibrillation. Patient has not taken his Cardizem in 2 days. Order received for Cardizem 20mg  IV.

## 2016-11-15 NOTE — Op Note (Addendum)
[facility] Patient Name: Jackson Martin Procedure Date: 11/15/2016 11:33 AM MRN: [id number] Date of Birth: 04/25/1940 Attending MD: Hildred Laser , MD CSN: [id number] Age: 76 Admit Type: Outpatient Procedure:                Colonoscopy Indications:              Heme positive stool, Follow-up for history of                            adenomatous polyps in the colon Providers:                Hildred Laser, MD, Janeece Riggers, RN, Bonnetta Barry,                            Technician Referring MD:             Fuller Canada. Manuella Ghazi, MD Medicines:                Meperidine 50 mg IV, Midazolam 5 mg IV Complications:            No immediate complications. Estimated Blood Loss:     Estimated blood loss was minimal. Procedure:                Pre-Anesthesia Assessment:                           - Prior to the procedure, a History and Physical                            was performed, and patient medications and                            allergies were reviewed. The patient's tolerance of                            previous anesthesia was also reviewed. The risks                            and benefits of the procedure and the sedation                            options and risks were discussed with the patient.                            All questions were answered, and informed consent                            was obtained. Prior Anticoagulants: The patient                            last took aspirin 2 days prior to the procedure.                            ASA Grade Assessment: III - A patient with severe  systemic disease. After reviewing the risks and                            benefits, the patient was deemed in satisfactory                            condition to undergo the procedure.                           After obtaining informed consent, the colonoscope                            was passed under direct vision. Throughout the   procedure, the patient's blood pressure, pulse, and                            oxygen saturations were monitored continuously. The                            [id number] ([id number]) was introduced through the                            anus and advanced to the the cecum, identified by                            appendiceal orifice and ileocecal valve. The                            colonoscopy was performed without difficulty. The                            patient tolerated the procedure well. The quality                            of the bowel preparation was adequate. The                            ileocecal valve, appendiceal orifice, and rectum                            were photographed. Scope In: 12:00:38 PM Scope Out: 12:46:53 PM Scope Withdrawal Time: 0 hours 33 minutes 47 seconds  Total Procedure Duration: 0 hours 46 minutes 15 seconds  Findings:      The perianal and digital rectal examinations were normal.      A few medium-mouthed diverticula were found in the cecum.      Multiple small and large-mouthed diverticula were found in the sigmoid       colon.      Nine sessile polyps were found in the sigmoid colon, splenic flexure and       transverse colon. The polyps were small in size. These were biopsied       with a cold forceps for histology.      A 6 mm polyp was found in the proximal sigmoid colon. The polyp was       semi-pedunculated. The polyp was removed  with a cold snare. Resection       and retrieval were complete. The pathology specimen was placed into       Bottle Number 1.      External hemorrhoids were found during retroflexion. The hemorrhoids       were small.      small lipoma at splenic flexure. Impression:               - Diverticulosis in the cecum.                           - Diverticulosis in the sigmoid colon.                           - Nine small polyps in the sigmoid colon, at the                            splenic flexure and in the transverse  colon.                            Biopsied.                           - One 6 mm polyp in the proximal sigmoid colon,                            removed with a cold snare. Resected and retrieved.                           - Small lipoma at splen                           - External hemorrhoids. Moderate Sedation:      Moderate (conscious) sedation was administered by the endoscopy nurse       and supervised by the endoscopist. The following parameters were       monitored: oxygen saturation, heart rate, blood pressure, CO2       capnography and response to care. Total physician intraservice time was       51 minutes. Recommendation:           - Patient has a contact number available for                            emergencies. The signs and symptoms of potential                            delayed complications were discussed with the                            patient. Return to normal activities tomorrow.                            Written discharge instructions were provided to the                            patient.                           -  High fiber diet today.                           - Continue present medications.                           - Resume aspirin at prior dose in 2 days.                           - Await pathology results.                           - Repeat colonoscopy in 3 years for surveillance.                           - Can resume Apixaban next week if okay with Dr.Sam                            Domenic Polite. Procedure Code(s):        --- Professional ---                           [phone], Colonoscopy, flexible; with removal of                            tumor(s), polyp(s), or other lesion(s) by snare                            technique                           45380, 59, Colonoscopy, flexible; with biopsy,                            single or multiple                           99152, Moderate sedation services provided by the                            same  physician or other qualified health care                            professional performing the diagnostic or                            therapeutic service that the sedation supports,                            requiring the presence of an independent trained                            observer to assist in the monitoring of the                            patient's level of consciousness and physiological  status; initial 15 minutes of intraservice time,                            patient age 53 years or older                           [phone], Moderate sedation services; each additional                            15 minutes intraservice time                           [phone], Moderate sedation services; each additional                            15 minutes intraservice time Diagnosis Code(s):        --- Professional ---                           K64.4, Residual hemorrhoidal skin tags                           D12.5, Benign neoplasm of sigmoid colon                           D12.3, Benign neoplasm of transverse colon (hepatic                            flexure or splenic flexure)                           R19.5, Other fecal abnormalities                           Z86.010, Personal history of colonic polyps                           K57.30, Diverticulosis of large intestine without                            perforation or abscess without bleeding CPT copyright 2016 American Medical Association. All rights reserved. The codes documented in this report are preliminary and upon coder review may  be revised to meet current compliance requirements. Hildred Laser, MD Hildred Laser, MD 11/15/2016 1:00:55 PM This report has been signed electronically. Number of Addenda: 0

## 2016-11-15 NOTE — Discharge Instructions (Signed)
Resume aspirin on 11/17/2016. Please check with Dr. Domenic Polite next week about resuming Apixaban. Resume other medications as before. High fiber diet. No driving for 24 hours. Physician will call with biopsy results.     Colonoscopy, Adult, Care After This sheet gives you information about how to care for yourself after your procedure. Your doctor may also give you more specific instructions. If you have problems or questions, call your doctor. Follow these instructions at home: General instructions   For the first 24 hours after the procedure: ? Do not drive or use machinery. ? Do not sign important documents. ? Do not drink alcohol. ? Do your daily activities more slowly than normal. ? Eat foods that are soft and easy to digest. ? Rest often.  Take over-the-counter or prescription medicines only as told by your doctor.  It is up to you to get the results of your procedure. Ask your doctor, or the department performing the procedure, when your results will be ready. To help cramping and bloating:  Try walking around.  Put heat on your belly (abdomen) as told by your doctor. Use a heat source that your doctor recommends, such as a moist heat pack or a heating pad. ? Put a towel between your skin and the heat source. ? Leave the heat on for 20-30 minutes. ? Remove the heat if your skin turns bright red. This is especially important if you cannot feel pain, heat, or cold. You can get burned. Eating and drinking  Drink enough fluid to keep your pee (urine) clear or pale yellow.  Return to your normal diet as told by your doctor. Avoid heavy or fried foods that are hard to digest.  Avoid drinking alcohol for as long as told by your doctor. Contact a doctor if:  You have blood in your poop (stool) 2-3 days after the procedure. Get help right away if:  You have more than a small amount of blood in your poop.  You see large clumps of tissue (blood clots) in your poop.  Your  belly is swollen.  You feel sick to your stomach (nauseous).  You throw up (vomit).  You have a fever.  You have belly pain that gets worse, and medicine does not help your pain. This information is not intended to replace advice given to you by your health care provider. Make sure you discuss any questions you have with your health care provider. Document Released: 03/24/2010 Document Revised: 11/14/2015 Document Reviewed: 11/14/2015 Elsevier Interactive Patient Education  2017 [place].    High-Fiber Diet Fiber, also called dietary fiber, is a type of carbohydrate found in fruits, vegetables, whole grains, and beans. A high-fiber diet can have many health benefits. Your health care provider may recommend a high-fiber diet to help:  Prevent constipation. Fiber can make your bowel movements more regular.  Lower your cholesterol.  Relieve hemorrhoids, uncomplicated diverticulosis, or irritable bowel syndrome.  Prevent overeating as part of a weight-loss plan.  Prevent heart disease, type 2 diabetes, and certain cancers.  What is my plan? The recommended daily intake of fiber includes:  38 grams for men under age 17.  31 grams for men over age 40.  13 grams for women under age 8.  52 grams for women over age 69.  You can get the recommended daily intake of dietary fiber by eating a variety of fruits, vegetables, grains, and beans. Your health care provider may also recommend a fiber supplement if it is not possible  to get enough fiber through your diet. What do I need to know about a high-fiber diet?  Fiber supplements have not been widely studied for their effectiveness, so it is better to get fiber through food sources.  Always check the fiber content on thenutrition facts label of any prepackaged food. Look for foods that contain at least 5 grams of fiber per serving.  Ask your dietitian if you have questions about specific foods that are related to your  condition, especially if those foods are not listed in the following section.  Increase your daily fiber consumption gradually. Increasing your intake of dietary fiber too quickly may cause bloating, cramping, or gas.  Drink plenty of water. Water helps you to digest fiber. What foods can I eat? Grains Whole-grain breads. Multigrain cereal. Oats and oatmeal. Brown rice. Barley. Bulgur wheat. [place]. Bran muffins. Popcorn. Rye wafer crackers. Vegetables Sweet potatoes. Spinach. Kale. Artichokes. Cabbage. Broccoli. Green peas. Carrots. Squash. Fruits Berries. Pears. Apples. Oranges. Avocados. Prunes and raisins. Dried figs. Meats and Other Protein Sources [place], kidney, pinto, and soy beans. Split peas. Lentils. Nuts and seeds. Dairy Fiber-fortified yogurt. Beverages Fiber-fortified soy milk. Fiber-fortified orange juice. Other Fiber bars. The items listed above may not be a complete list of recommended foods or beverages. Contact your dietitian for more options. What foods are not recommended? Grains White bread. Pasta made with refined flour. White rice. Vegetables Fried potatoes. Canned vegetables. Well-cooked vegetables. Fruits Fruit juice. Cooked, strained fruit. Meats and Other Protein Sources Fatty cuts of meat. Fried Sales executive or fried fish. Dairy Milk. Yogurt. Cream cheese. Sour cream. Beverages Soft drinks. Other Cakes and pastries. Butter and oils. The items listed above may not be a complete list of foods and beverages to avoid. Contact your dietitian for more information. What are some tips for including high-fiber foods in my diet?  Eat a wide variety of high-fiber foods.  Make sure that half of all grains consumed each day are whole grains.  Replace breads and cereals made from refined flour or white flour with whole-grain breads and cereals.  Replace white rice with brown rice, bulgur wheat, or millet.  Start the day with a breakfast that is high in fiber,  such as a cereal that contains at least 5 grams of fiber per serving.  Use beans in place of meat in soups, salads, or pasta.  Eat high-fiber snacks, such as berries, raw vegetables, nuts, or popcorn. This information is not intended to replace advice given to you by your health care provider. Make sure you discuss any questions you have with your health care provider. Document Released: 02/19/2005 Document Revised: 07/28/2015 Document Reviewed: 08/04/2013 Elsevier Interactive Patient Education  2017 [employer].

## 2016-11-19 ENCOUNTER — Telehealth: Payer: Self-pay | Admitting: Cardiology

## 2016-11-19 ENCOUNTER — Encounter (HOSPITAL_COMMUNITY): Payer: Self-pay | Admitting: Internal Medicine

## 2016-11-19 NOTE — Telephone Encounter (Signed)
PATIENT got reply back on Eliquis   They approved thru Mar 04, 2017 wants to talk to you about this.  [phone]

## 2016-11-19 NOTE — Telephone Encounter (Signed)
Spoke with pt.  He has not received Eliquis yet.  Told pt when Eliquis arrives to stop ASA and start Eliquis Q 12hrs.  Pt is to call me when he starts so I can make an appt to see him 1 month after for f/u and CBC and BMP.  Reviewed Dr Olevia Perches note from colonoscopy.  He was cleared to start Eliquis on 11/22/16 or after.  Pt will need close monitoring for potential recurrence of GI bleed.

## 2016-11-20 DIAGNOSIS — E663 Overweight: Secondary | ICD-10-CM | POA: Diagnosis not present

## 2016-11-20 DIAGNOSIS — I4891 Unspecified atrial fibrillation: Secondary | ICD-10-CM | POA: Diagnosis not present

## 2016-11-20 DIAGNOSIS — Z713 Dietary counseling and surveillance: Secondary | ICD-10-CM | POA: Diagnosis not present

## 2016-11-20 DIAGNOSIS — F419 Anxiety disorder, unspecified: Secondary | ICD-10-CM | POA: Diagnosis not present

## 2016-11-20 DIAGNOSIS — Z299 Encounter for prophylactic measures, unspecified: Secondary | ICD-10-CM | POA: Diagnosis not present

## 2016-11-20 DIAGNOSIS — J449 Chronic obstructive pulmonary disease, unspecified: Secondary | ICD-10-CM | POA: Diagnosis not present

## 2016-11-20 DIAGNOSIS — I10 Essential (primary) hypertension: Secondary | ICD-10-CM | POA: Diagnosis not present

## 2016-11-20 DIAGNOSIS — E785 Hyperlipidemia, unspecified: Secondary | ICD-10-CM | POA: Diagnosis not present

## 2016-11-20 DIAGNOSIS — R600 Localized edema: Secondary | ICD-10-CM | POA: Diagnosis not present

## 2016-11-27 NOTE — Telephone Encounter (Signed)
Eliquis is on hold

## 2016-12-03 DIAGNOSIS — E785 Hyperlipidemia, unspecified: Secondary | ICD-10-CM | POA: Diagnosis not present

## 2016-12-03 DIAGNOSIS — J449 Chronic obstructive pulmonary disease, unspecified: Secondary | ICD-10-CM | POA: Diagnosis not present

## 2016-12-03 DIAGNOSIS — Z299 Encounter for prophylactic measures, unspecified: Secondary | ICD-10-CM | POA: Diagnosis not present

## 2016-12-03 DIAGNOSIS — J441 Chronic obstructive pulmonary disease with (acute) exacerbation: Secondary | ICD-10-CM | POA: Diagnosis not present

## 2016-12-03 DIAGNOSIS — G4700 Insomnia, unspecified: Secondary | ICD-10-CM | POA: Diagnosis not present

## 2016-12-03 DIAGNOSIS — I10 Essential (primary) hypertension: Secondary | ICD-10-CM | POA: Diagnosis not present

## 2016-12-03 DIAGNOSIS — Z6828 Body mass index (BMI) 28.0-28.9, adult: Secondary | ICD-10-CM | POA: Diagnosis not present

## 2016-12-05 ENCOUNTER — Telehealth: Payer: Self-pay | Admitting: *Deleted

## 2016-12-05 NOTE — Telephone Encounter (Signed)
Mr. Mccardle walked into the office today asking for Edrick Oh, RN  He has questions about his Eliquis and why he has not received his medication.  Please call patient at his home.

## 2016-12-05 NOTE — Telephone Encounter (Signed)
Pt states he spoke with representative at [facility] and they are mailing the medicine to his home.  Told pt I would see if we had samples in the [facility] office tomorrow.  If so I will call pt to pick them up and go ahead and start while waiting on Rx to arrive.  Pt in agreement.

## 2016-12-06 ENCOUNTER — Telehealth: Payer: Self-pay | Admitting: *Deleted

## 2016-12-06 NOTE — Telephone Encounter (Signed)
Called pt to pick up Eliquis samples. Eliquis 5mg  tablets #28 (Lot [id number]  Exp: 02/2019) given to pt to start on until Rx arrives.  He has been approved for pt assistance thru Dec/2018.  He will pick up today.

## 2016-12-10 DIAGNOSIS — M109 Gout, unspecified: Secondary | ICD-10-CM | POA: Diagnosis not present

## 2016-12-10 DIAGNOSIS — Z6828 Body mass index (BMI) 28.0-28.9, adult: Secondary | ICD-10-CM | POA: Diagnosis not present

## 2016-12-10 DIAGNOSIS — J449 Chronic obstructive pulmonary disease, unspecified: Secondary | ICD-10-CM | POA: Diagnosis not present

## 2016-12-10 DIAGNOSIS — I4891 Unspecified atrial fibrillation: Secondary | ICD-10-CM | POA: Diagnosis not present

## 2016-12-10 DIAGNOSIS — Z299 Encounter for prophylactic measures, unspecified: Secondary | ICD-10-CM | POA: Diagnosis not present

## 2016-12-21 ENCOUNTER — Institutional Professional Consult (permissible substitution): Payer: Medicare Other | Admitting: Emergency Medicine

## 2016-12-25 ENCOUNTER — Institutional Professional Consult (permissible substitution): Payer: Medicare Other | Admitting: Internal Medicine

## 2016-12-25 ENCOUNTER — Ambulatory Visit (INDEPENDENT_AMBULATORY_CARE_PROVIDER_SITE_OTHER)
Admission: RE | Admit: 2016-12-25 | Discharge: 2016-12-25 | Disposition: A | Discharge status: Home / Self Care | Payer: Medicare Other | Source: Ambulatory Visit | Attending: Internal Medicine | Admitting: Internal Medicine

## 2016-12-25 ENCOUNTER — Ambulatory Visit (INDEPENDENT_AMBULATORY_CARE_PROVIDER_SITE_OTHER): Payer: Medicare Other | Admitting: Internal Medicine

## 2016-12-25 ENCOUNTER — Encounter: Payer: Self-pay | Admitting: Internal Medicine

## 2016-12-25 ENCOUNTER — Other Ambulatory Visit (INDEPENDENT_AMBULATORY_CARE_PROVIDER_SITE_OTHER): Payer: Medicare Other

## 2016-12-25 VITALS — BP 108/72 | HR 104 | Ht 69.0 in | Wt 189.0 lb

## 2016-12-25 DIAGNOSIS — I259 Chronic ischemic heart disease, unspecified: Secondary | ICD-10-CM | POA: Diagnosis not present

## 2016-12-25 DIAGNOSIS — R0609 Other forms of dyspnea: Secondary | ICD-10-CM | POA: Diagnosis not present

## 2016-12-25 DIAGNOSIS — R0602 Shortness of breath: Secondary | ICD-10-CM | POA: Diagnosis not present

## 2016-12-25 DIAGNOSIS — J449 Chronic obstructive pulmonary disease, unspecified: Secondary | ICD-10-CM | POA: Diagnosis not present

## 2016-12-25 LAB — CBC WITH DIFFERENTIAL/PLATELET
BASOS PCT: 1.3 % (ref 0.0–3.0)
Basophils Absolute: 0.1 10*3/uL (ref 0.0–0.1)
EOS ABS: 0.2 10*3/uL (ref 0.0–0.7)
EOS PCT: 1.9 % (ref 0.0–5.0)
HCT: 49.2 % (ref 39.0–52.0)
Hemoglobin: 16.4 g/dL (ref 13.0–17.0)
Lymphocytes Relative: 16.7 % (ref 12.0–46.0)
Lymphs Abs: 1.4 10*3/uL (ref 0.7–4.0)
MCHC: 33.4 g/dL (ref 30.0–36.0)
MCV: 95.9 fl (ref 78.0–100.0)
MONO ABS: 0.5 10*3/uL (ref 0.1–1.0)
Monocytes Relative: 6.2 % (ref 3.0–12.0)
NEUTROS ABS: 6.2 10*3/uL (ref 1.4–7.7)
Neutrophils Relative %: 73.9 % (ref 43.0–77.0)
PLATELETS: 220 10*3/uL (ref 150.0–400.0)
RBC: 5.13 Mil/uL (ref 4.22–5.81)
RDW: 14 % (ref 11.5–15.5)
WBC: 8.4 10*3/uL (ref 4.0–10.5)

## 2016-12-25 LAB — TSH: TSH: 4.33 u[IU]/mL (ref 0.35–4.50)

## 2016-12-25 LAB — BASIC METABOLIC PANEL
BUN: 14 mg/dL (ref 6–23)
CALCIUM: 9.9 mg/dL (ref 8.4–10.5)
CO2: 33 meq/L — AB (ref 19–32)
Chloride: 99 mEq/L (ref 96–112)
Creatinine, Ser: 1.24 mg/dL (ref 0.40–1.50)
GFR: 59.89 mL/min — ABNORMAL LOW (ref >60.00)
GLUCOSE: 112 mg/dL — AB (ref 70–99)
POTASSIUM: 4 meq/L (ref 3.5–5.1)
SODIUM: 138 meq/L (ref 135–145)

## 2016-12-25 LAB — NITRIC OXIDE: NITRIC OXIDE: 7

## 2016-12-25 LAB — BRAIN NATRIURETIC PEPTIDE: Pro B Natriuretic peptide (BNP): 48 pg/mL (ref 0.0–100.0)

## 2016-12-25 MED ORDER — BUDESONIDE-FORMOTEROL FUMARATE 80-4.5 MCG/ACT IN AERO: 2.0000 | INHALATION_SPRAY | Freq: Two times a day (BID) | RESPIRATORY_TRACT | 0 refills | Status: DC

## 2016-12-25 MED ORDER — BUDESONIDE-FORMOTEROL FUMARATE 80-4.5 MCG/ACT IN AERO: 2.0000 | INHALATION_SPRAY | Freq: Two times a day (BID) | RESPIRATORY_TRACT | 11 refills | Status: DC

## 2016-12-25 NOTE — Assessment & Plan Note (Addendum)
Quit smoking 1993 - Spirometry 12/25/2016  FEV1 2.26 (80%)  Ratio 71  - FENO 12/25/2016  =   7  - 12/25/2016   Walked RA  2 laps @ 185 ft each stopped due to   Sob at nl pace - 12/25/2016  After extensive coaching HFA effectiveness =    90% > try symb 80 2bid    DDX of  resp symptoms of unknown origin mimicking " bad copd" which he clearly does not have  almost all start with A and  include Adherence, Ace Inhibitors, Acid Reflux, Active Sinus Disease, Alpha 1 Antitripsin deficiency, Anxiety masquerading as Airways dz,  ABPA,  Allergy(esp in young), Aspiration (esp in elderly), Adverse effects of meds,  Active smokers, A bunch of PE's (a small clot burden can't cause this syndrome unless there is already severe underlying pulm or vascular dz with poor reserve) Anemia and thyroid dz plus two Bs  = Bronchiectasis and Beta blocker use..and one C= CHF   Adherence is always the initial "prime suspect" and is a multilayered concern that requires a "trust but verify" approach in every patient - starting with knowing how to use medications, especially inhalers, correctly, keeping up with refills and understanding the fundamental difference between maintenance and prns vs those medications only taken for a very short course and then stopped and not refilled.  - see hfa teaching/ confirm at next ov - return with all meds in hand using a trust but verify approach to confirm accurate Medication  Reconciliation The principal here is that until we are certain that the  patients are doing what we've asked, it makes no sense to ask them to do more.    ? Acid (or non-acid) GERD > always difficult to exclude as up to 75% of pts in some series report no assoc GI/ Heartburn symptoms> rec max (24h)  acid suppression and diet restrictions/ reviewed and instructions given in writing.   ? Allergy/ asthma component > very unlikely with FENO so low >> rec  continue singulair/ check allergy profile, keep ics dose low for now  in case he has element of uacs  ? Adverse effects of dpi contributing to uacs > d/c dpi, just use hfa or smi going forward  ? Anxiety/depression/ cognitive impairment  > usually at the bottom of this list of usual suspects but should be much higher on this pt's based on H and P and note already on psychotropics .  ? A bunch of PE's > unlikely on eliquis  ? Anemia / thyroid dz > ruled out this ov   ? CHF >>  BNP so low rules out    Will see him back in 6 weeks and regroup then   Total time devoted to counseling  > 50 % of initial 60 min office visit for pt "newly" seen (> 3 y gap) :  review case with pt/ discussion of options/alternatives/ personally creating written customized instructions  in presence of pt  then going over those specific  Instructions directly with the pt including how to use all of the meds but in particular covering each new medication in detail and the difference between the maintenance= "automatic" meds and the prns using an action plan format for the latter (If this problem/symptom => do that organization reading Left to right).  Please see AVS from this visit for a full list of these instructions which I personally wrote for this pt and  are unique to this visit.

## 2016-12-25 NOTE — Patient Instructions (Addendum)
Plan A = Automatic =  Symbicort 80 Take 2 puffs first thing in am and then another 2 puffs about 12 hours later and take Try prilosec otc 20mg   Take 30-60 min before first meal of the day and Pepcid ac (famotidine) 20 mg one @  bedtime until cough is completely gone for at least a week without the need for cough suppression  GERD (REFLUX)  is an extremely common cause of respiratory symptoms just like yours , many times with no obvious heartburn at all.    It can be treated with medication, but also with lifestyle changes including elevation of the head of your bed (ideally with 6 inch  bed blocks),  Smoking cessation, avoidance of late meals, excessive alcohol, and avoid fatty foods, chocolate, peppermint, colas, red wine, and acidic juices such as orange juice.  NO MINT OR MENTHOL PRODUCTS SO NO COUGH DROPS  USE SUGARLESS CANDY INSTEAD (Jolley ranchers or Stover's or Life Savers) or even ice chips will also do - the key is to swallow to prevent all throat clearing. NO OIL BASED VITAMINS - use powdered substitutes.     Work on Doctor, hospital technique:  relax and gently blow all the way out then take a nice smooth deep breath back in, triggering the inhaler at same time you start breathing in.  Hold for up to 5 seconds if you can. Blow out thru nose. Rinse and gargle with water when done      Plan B = Backup Only use your albuterol as a rescue medication to be used if you can't catch your breath by resting or doing a relaxed purse lip breathing pattern.  - The less you use it, the better it will work when you need it. - Ok to use the inhaler up to 2 puffs  every 4 hours if you must but call for appointment if use goes up over your usual need - Don't leave home without it !!  (think of it like the spare tire for your car)   Plan C = Crisis - only use your albuterol nebulizer if you first try Plan B and it fails to help > ok to use the nebulizer up to every 4 hours but if start needing it  regularly call for immediate appointment     Please remember to go to the lab and x-ray department downstairs in the basement  for your tests - we will call you with the results when they are available.      Please schedule a follow up office visit in 4 weeks, call sooner if needed with pfts and bring all your medications and inhalers

## 2016-12-25 NOTE — Progress Notes (Signed)
LMTCB

## 2016-12-25 NOTE — Progress Notes (Signed)
Subjective:     Patient ID: Jackson Martin, male   DOB: 09-30-1940,    MRN: [id number]  HPI  21 yowm quit smoking 1993 with dx of asthma in this clinic in the 90's manifested cough/ sob maint advair chronically and still having times sev times a month where needed more albuterol referred to pulmonary clinic 12/25/2016 by Dr   Manuella Ghazi with much worse sob x spring  2018 s assoc cough but severe hoarseness.   12/25/2016 1st [facility] office visit/ Roxane Puerto   Chief Complaint  Patient presents with  . Pulmonary Consult    Referred by Dr Manuella Ghazi. Pt c/o increased SOB x 6 months- wakes up in the night feeling SOB.  He states he occ has cough with white sputum.  He does not have a rescue inhaler, but uses albuterol neb a few x per wk on average.    noct smothering sev times a week x 6 months better if sits up [employer] for CAF with Echocardiogram 08/06/2016 [facility]): Normal LV wall thickness with LVEF 60-65%, normal right ventricular contraction, mild left atrial enlargement, mildly sclerotic aortic valve without stenosis, mild mitral regurgitation, mild tricuspid regurgitation, no pericardial effusion.  Some dry cough with doe s much variability assoc with hoarseness on maint advair dpi/ some better with neb and short course prednisone Doe =   MMRC2 = can't walk a nl pace on a flat grade s sob but does fine slow and flat eg shopping at [facility]    No obvious day to day or daytime variability or assoc excess/ purulent sputum or mucus plugs or hemoptysis or cp or chest tightness, subjective wheeze or overt sinus or hb symptoms. No unusual exp hx or h/o childhood pna/ asthma or knowledge of premature birth.  Sleeping poorly with freq nocturnal  And early am exacerbation  of respiratory  C/o's and  need for noct saba. Also denies any obvious fluctuation of symptoms with weather or environmental changes or other aggravating or alleviating factors except as outlined above   Current Allergies,  Complete Past Medical History, Past Surgical History, Family History, and Social History were reviewed in [employer] record.  ROS  The following are not active complaints unless bolded Hoarseness, sore throat, dysphagia, dental problems, itching, sneezing,  nasal congestion or discharge of excess mucus or purulent secretions, ear ache,   fever, chills, sweats, unintended wt loss or wt gain, classically pleuritic or exertional cp,  orthopnea pnd or leg swelling, presyncope, palpitations, abdominal pain, anorexia, nausea, vomiting, diarrhea  or change in bowel habits or change in bladder habits, change in stools or change in urine, dysuria, hematuria,  rash, arthralgias, visual complaints, headache, numbness, weakness or ataxia or problems with walking or coordination,  change in mood/affect or memory.        Current Meds  Medication Sig  . albuterol (PROVENTIL) (2.5 MG/3ML) 0.083% nebulizer solution Take 2.5 mg by nebulization every 6 (six) hours as needed for wheezing or shortness of breath.  . allopurinol (ZYLOPRIM) 300 MG tablet Take 300 mg by mouth 2 (two) times daily.   Marland Kitchen apixaban (ELIQUIS) 5 MG TABS tablet Take 1 tablet (5 mg total) by mouth 2 (two) times daily.  Marland Kitchen aspirin EC 81 MG tablet Take 1 tablet (81 mg total) by mouth at bedtime.  Marland Kitchen atorvastatin (LIPITOR) 20 MG tablet Take 20 mg by mouth at bedtime.   . citalopram (CELEXA) 20 MG tablet Take 20 mg by mouth daily with  lunch.   . clonazePAM (KLONOPIN) 0.5 MG tablet Take 0.5 mg by mouth 2 (two) times daily as needed. For anxiety  . diltiazem (CARDIZEM CD) 240 MG 24 hr capsule Take 1 capsule (240 mg total) by mouth daily.  . furosemide (LASIX) 20 MG tablet Take 20 mg by mouth daily as needed (for fluid retention.).   Marland Kitchen mirtazapine (REMERON) 15 MG tablet Take 15 mg by mouth at bedtime.  . montelukast (SINGULAIR) 10 MG tablet Take 10 mg by mouth at bedtime.  . Potassium Chloride (KLOR-CON 10 PO) Take 10 mEq by mouth  daily as needed (FOR FLUID RETENTION/WITH LASIX).   . [  Fluticasone-Salmeterol (  250-50 MCG/DOSE AEPB Inhale 1 puff into the lungs 2 (two) times daily.              Review of Systems     Objective:   Physical Exam    Hoarse chronically ill amb wm very shaky on details of care/ symptoms/ names of meds   Wt Readings from Last 3 Encounters:  12/25/16 189 lb (85.7 kg)  09/26/16 189 lb 6.4 oz (85.9 kg)  09/04/16 182 lb (82.6 kg)    Vital signs reviewed  - Note on arrival 02 sats  97% on RA     HEENT: nl dentition, turbinates bilaterally, and oropharynx. Nl external ear canals without cough reflex   NECK :  without JVD/Nodes/TM/ nl carotid upstrokes bilaterally   LUNGS: no acc muscle use,  Nl contour chest with insp and exp mostly upper airway transmitted wheeze    CV: IRIR  no s3 or murmur or increase in P2, and  1+ sym lower ext  edema   ABD:  soft and nontender with nl inspiratory excursion in the supine position. No bruits or organomegaly appreciated, bowel sounds nl  MS:  Nl gait/ ext warm without deformities, calf tenderness, cyanosis or clubbing No obvious joint restrictions   SKIN: warm and dry without lesions    NEURO:  alert, approp, nl sensorium with  no motor or cerebellar deficits apparent.    CXR PA and Lateral:   12/25/2016 :    I personally reviewed images and agree with radiology impression as follows:   No active cardiopulmonary disease.     Labs ordered/ reviewed:      Chemistry      Component Value Date/Time   NA 138 12/25/2016 1553   K 4.0 12/25/2016 1553   CL 99 12/25/2016 1553   CO2 33 (H) 12/25/2016 1553   BUN 14 12/25/2016 1553   CREATININE 1.24 12/25/2016 1553      Component Value Date/Time   CALCIUM 9.9 12/25/2016 1553        Lab Results  Component Value Date   WBC 8.4 12/25/2016   HGB 16.4 12/25/2016   HCT 49.2 12/25/2016   MCV 95.9 12/25/2016   PLT 220.0 12/25/2016        Lab Results  Component Value Date    TSH 4.33 12/25/2016     Lab Results  Component Value Date   PROBNP 48.0 12/25/2016            Assessment:

## 2016-12-26 ENCOUNTER — Telehealth: Payer: Self-pay | Admitting: Internal Medicine

## 2016-12-26 LAB — RESPIRATORY ALLERGY PROFILE REGION II ~~LOC~~
Allergen, A. alternata, m6: <0.1 kU/L
Allergen, Cedar tree, t12: <0.1 kU/L
Allergen, Comm Silver Birch, t9: <0.1 kU/L
Allergen, Mouse Urine Protein, e78: <0.1 kU/L
Allergen, Mulberry, t76: <0.1 kU/L
Allergen, P. notatum, m1: <0.1 kU/L
Aspergillus fumigatus, m3: <0.1 kU/L
CLADOSPORIUM HERBARUM (M2) IGE: <0.1 kU/L
CLASS: 0
CLASS: 0
CLASS: 0
CLASS: 0
CLASS: 0
CLASS: 0
CLASS: 0
CLASS: 0
CLASS: 0
CLASS: 0
COMMON RAGWEED (SHORT) (W1) IGE: <0.1 kU/L
Cat Dander: <0.1 kU/L
Class: 0
Class: 0
Class: 0
Class: 0
Class: 0
Class: 0
Class: 0
Class: 0
Class: 0
Class: 0
Class: 0
Class: 0
Class: 0
Class: 0
Cockroach: <0.1 kU/L
D. farinae: <0.1 kU/L
Elm IgE: <0.1 kU/L
IGE (IMMUNOGLOBULIN E), SERUM: 28 kU/L (ref <=114)
Pecan/Hickory Tree IgE: <0.1 kU/L
Rough Pigweed  IgE: <0.1 kU/L

## 2016-12-26 LAB — INTERPRETATION:

## 2016-12-26 NOTE — Telephone Encounter (Signed)
Patient called back to confirm contact # [phone]

## 2016-12-26 NOTE — Assessment & Plan Note (Signed)
No evidence of chf/ thyroid dz/ anemia  It looks like he could do a lot more if he learned better pacing skills/ advised

## 2016-12-26 NOTE — Telephone Encounter (Signed)
Notes recorded by Tanda Rockers, MD on 12/25/2016 at 5:13 PM EDT Call pt: Reviewed cxr and no acute change so no change in recommendations made at [facility]  Advised pt of results. Pt understood and nothing further is needed.

## 2016-12-28 NOTE — Progress Notes (Signed)
Spoke with pt and notified of results per Dr. Wert. Pt verbalized understanding and denied any questions. 

## 2017-01-04 ENCOUNTER — Telehealth: Payer: Self-pay | Admitting: Internal Medicine

## 2017-01-04 ENCOUNTER — Emergency Department (HOSPITAL_COMMUNITY)
Admission: EM | Admit: 2017-01-04 | Discharge: 2017-01-04 | Disposition: A | Discharge status: Home / Self Care | Payer: Medicare Other | Attending: Emergency Medicine | Admitting: Emergency Medicine

## 2017-01-04 ENCOUNTER — Emergency Department (HOSPITAL_COMMUNITY): Payer: Medicare Other

## 2017-01-04 ENCOUNTER — Encounter (HOSPITAL_COMMUNITY): Payer: Self-pay | Admitting: Emergency Medicine

## 2017-01-04 DIAGNOSIS — R0600 Dyspnea, unspecified: Secondary | ICD-10-CM | POA: Insufficient documentation

## 2017-01-04 DIAGNOSIS — Z79899 Other long term (current) drug therapy: Secondary | ICD-10-CM | POA: Insufficient documentation

## 2017-01-04 DIAGNOSIS — I10 Essential (primary) hypertension: Secondary | ICD-10-CM | POA: Diagnosis not present

## 2017-01-04 DIAGNOSIS — R0602 Shortness of breath: Secondary | ICD-10-CM | POA: Diagnosis not present

## 2017-01-04 DIAGNOSIS — Z87891 Personal history of nicotine dependence: Secondary | ICD-10-CM | POA: Insufficient documentation

## 2017-01-04 DIAGNOSIS — J449 Chronic obstructive pulmonary disease, unspecified: Secondary | ICD-10-CM | POA: Insufficient documentation

## 2017-01-04 DIAGNOSIS — J45909 Unspecified asthma, uncomplicated: Secondary | ICD-10-CM | POA: Diagnosis not present

## 2017-01-04 LAB — BASIC METABOLIC PANEL
ANION GAP: 12 (ref 5–15)
BUN: 14 mg/dL (ref 6–20)
CHLORIDE: 98 mmol/L — AB (ref 101–111)
CO2: 27 mmol/L (ref 22–32)
Calcium: 9.1 mg/dL (ref 8.9–10.3)
Creatinine, Ser: 1.33 mg/dL — ABNORMAL HIGH (ref 0.61–1.24)
GFR calc Af Amer: 57 mL/min — ABNORMAL LOW (ref >60)
GFR calc non Af Amer: 50 mL/min — ABNORMAL LOW (ref >60)
GLUCOSE: 120 mg/dL — AB (ref 65–99)
POTASSIUM: 3.5 mmol/L (ref 3.5–5.1)
Sodium: 137 mmol/L (ref 135–145)

## 2017-01-04 LAB — CBC
HEMATOCRIT: 45.8 % (ref 39.0–52.0)
HEMOGLOBIN: 15.6 g/dL (ref 13.0–17.0)
MCH: 32 pg (ref 26.0–34.0)
MCHC: 34.1 g/dL (ref 30.0–36.0)
MCV: 94 fL (ref 78.0–100.0)
Platelets: 223 10*3/uL (ref 150–400)
RBC: 4.87 MIL/uL (ref 4.22–5.81)
RDW: 13.2 % (ref 11.5–15.5)
WBC: 6.9 10*3/uL (ref 4.0–10.5)

## 2017-01-04 LAB — BRAIN NATRIURETIC PEPTIDE: B NATRIURETIC PEPTIDE 5: 31 pg/mL (ref 0.0–100.0)

## 2017-01-04 LAB — POCT I-STAT TROPONIN I: Troponin i, poc: 0.01 ng/mL (ref 0.00–0.08)

## 2017-01-04 LAB — D-DIMER, QUANTITATIVE: D-Dimer, Quant: <0.27 ug/mL-FEU (ref 0.00–0.50)

## 2017-01-04 LAB — TROPONIN I: Troponin I: <0.03 ng/mL (ref <0.03)

## 2017-01-04 NOTE — Telephone Encounter (Signed)
No additional recs at this point > to er prn over the weekend

## 2017-01-04 NOTE — ED Provider Notes (Signed)
[facility] EMERGENCY DEPARTMENT Provider Note   CSN: [id number] Arrival date & time: 01/04/17  1730     History   Chief Complaint Chief Complaint  Patient presents with  . Shortness of Breath    HPI Jackson Martin is a 76 y.o. male.  HPI Presents to the emergency room for evaluation of shortness of breath.  Patient states his symptoms started this morning about 230.  He woke up feeling short of breath like his air was getting cut off.  Patient denies any history of chest pain.  He has noticed some leg swelling may be a little bit more on the right side.  He has a history of COPD as well as asthma and atrial fibrillation.  He has a history of cardiac stents as well.  Patient denies any fevers or chills.  Right now he is feeling better.  He was concerned about his symptoms so he decided to come to the emergency room to get checked out. Past Medical History:  Diagnosis Date  . Anxiety   . Asthmatic bronchitis   . Atrial fibrillation ([place])   . Backache   . Colon polyps   . COPD (chronic obstructive pulmonary disease) ([place])   . Dyspnea   . Esophageal reflux   . Essential hypertension   . Gout   . Hyperlipidemia   . Ischemic heart disease   . MI (myocardial infarction) ([place]) 2003  . Neck pain   . Plantar fasciitis   . Prostate cancer [facility])     Patient Active Problem List   Diagnosis Date Noted  . Dyspnea on exertion 12/25/2016  . COPD  GOLD 0/ ? AB 12/25/2016  . Gastrointestinal hemorrhage 10/29/2016    Past Surgical History:  Procedure Laterality Date  . COLONOSCOPY    . COLONOSCOPY N/A 11/15/2016   Procedure: COLONOSCOPY;  Surgeon: Rogene Houston, MD;  Location: [facility] ENDO [facility];  Service: Endoscopy;  Laterality: N/A;  1200  . POLYPECTOMY  11/15/2016   Procedure: POLYPECTOMY;  Surgeon: Rogene Houston, MD;  Location: [facility] ENDO SUITE;  Service: Endoscopy;;  colon  . PROSTATECTOMY  2001       Home Medications    Prior to Admission medications   Medication Sig  Start Date End Date Taking? Authorizing Provider  albuterol (PROVENTIL) (2.5 MG/3ML) 0.083% nebulizer solution Take 2.5 mg by nebulization every 6 (six) hours as needed for wheezing or shortness of breath.   Yes [provider]  allopurinol (ZYLOPRIM) 300 MG tablet Take 300 mg by mouth daily.    Yes [provider]  apixaban (ELIQUIS) 5 MG TABS tablet Take 1 tablet (5 mg total) by mouth 2 (two) times daily. 11/09/16  Yes Satira Sark, MD  atorvastatin (LIPITOR) 20 MG tablet Take 20 mg by mouth at bedtime.    Yes [provider]  budesonide-formoterol (SYMBICORT) 80-4.5 MCG/ACT inhaler Inhale 2 puffs into the lungs 2 (two) times daily. 12/25/16  Yes Tanda Rockers, MD  citalopram (CELEXA) 20 MG tablet Take 20 mg by mouth daily with lunch.    Yes [provider]  clonazePAM (KLONOPIN) 0.5 MG tablet Take 0.5 mg by mouth 2 (two) times daily as needed. For anxiety 10/19/16  Yes [provider]  diltiazem (CARDIZEM CD) 240 MG 24 hr capsule Take 1 capsule (240 mg total) by mouth daily. 09/26/16 01/04/17 Yes Satira Sark, MD  furosemide (LASIX) 20 MG tablet Take 20 mg by mouth daily as needed (for fluid retention.).  Yes [provider]  mirtazapine (REMERON) 15 MG tablet Take 15 mg by mouth at bedtime.   Yes [provider]  montelukast (SINGULAIR) 10 MG tablet Take 10 mg by mouth at bedtime.   Yes [provider]  Potassium Chloride (KLOR-CON 10 PO) Take 10 mEq by mouth daily as needed (FOR FLUID RETENTION/WITH LASIX).    Yes [provider]    Family History Family History  Problem Relation Age of Onset  . Hypertension Mother   . Heart disease Mother   . Heart disease Maternal Aunt   . Heart disease Maternal Uncle   . Heart disease Maternal Grandmother   . Heart disease Maternal Grandfather   . Colon cancer Brother     Social History Social History  Substance Use Topics  . Smoking status: Former Smoker     Packs/day: 0.75    Years: 3.00    Types: Cigarettes    Quit date: 03/06/1991  . Smokeless tobacco: Never Used  . Alcohol use No     Allergies   Patient has no known allergies.   Review of Systems Review of Systems  All other systems reviewed and are negative.    Physical Exam Updated Vital Signs BP (!) 148/97   Pulse (!) 104   Temp 97.9 F (36.6 C) (Oral)   Resp 20   Ht 1.753 m (5\' 9" )   Wt 85.7 kg (189 lb)   SpO2 94%   BMI 27.91 kg/m   Physical Exam  Constitutional: He appears well-developed and well-nourished. No distress.  HENT:  Head: Normocephalic and atraumatic.  Right Ear: External ear normal.  Left Ear: External ear normal.  Eyes: Conjunctivae are normal. Right eye exhibits no discharge. Left eye exhibits no discharge. No scleral icterus.  Neck: Neck supple. No tracheal deviation present.  Cardiovascular: Normal rate, regular rhythm and intact distal pulses.   Pulmonary/Chest: Effort normal and breath sounds normal. No stridor. No respiratory distress. He has no wheezes. He has no rales.  Abdominal: Soft. Bowel sounds are normal. He exhibits no distension. There is no tenderness. There is no rebound and no guarding.  Musculoskeletal: He exhibits no edema or tenderness.  Neurological: He is alert. He has normal strength. No cranial nerve deficit (no facial droop, extraocular movements intact, no slurred speech) or sensory deficit. He exhibits normal muscle tone. He displays no seizure activity. Coordination normal.  Skin: Skin is warm and dry. No rash noted.  Psychiatric: He has a normal mood and affect.  Nursing note and vitals reviewed.    ED Treatments / Results  Labs (all labs ordered are listed, but only abnormal results are displayed) Labs Reviewed  BASIC METABOLIC PANEL - Abnormal; Notable for the following:       Result Value   Chloride 98 (*)    Glucose, Bld 120 (*)    Creatinine, Ser 1.33 (*)    GFR calc non Af Amer 50 (*)    GFR calc Af  Amer 57 (*)    All other components within normal limits  CBC  TROPONIN I  BRAIN NATRIURETIC PEPTIDE  D-DIMER, QUANTITATIVE (NOT AT [facility])  I-STAT TROPONIN, ED  POCT I-STAT TROPONIN I    EKG  EKG Interpretation  Date/Time:  Friday January 04 2017 17:36:07 EDT Ventricular Rate:  110 PR Interval:    QRS Duration: 124 QT Interval:  362 QTC Calculation: 489 R Axis:   84 Text Interpretation:  Atrial fibrillation with rapid ventricular response with premature ventricular  or aberrantly conducted complexes Right bundle branch block T wave abnormality, consider inferior ischemia Abnormal ECG atrial fibrillation is new since last tarcing inferior changes are new since last tracing Confirmed by Dorie Rank [phone]) on 01/04/2017 7:33:09 PM       Radiology Dg Chest 2 View  Result Date: 01/04/2017 CLINICAL DATA:  Shortness of breath. EXAM: CHEST  2 VIEW COMPARISON:  Two-view chest x-ray 12/25/2016. FINDINGS: The heart size and mediastinal contours are within normal limits. Both lungs are clear. The visualized skeletal structures are unremarkable. Aortic atherosclerosis is present. IMPRESSION: No acute cardiopulmonary disease or significant interval change. Electronically Signed   By: San Morelle M.D.   On: 01/04/2017 18:09    Procedures Procedures (including critical care time)  Medications Ordered in ED Medications - No data to display   Initial Impression / Assessment and Plan / ED Course  I have reviewed the triage vital signs and the nursing notes.  Pertinent labs & imaging results that were available during my care of the patient were reviewed by me and considered in my medical decision making (see chart for details).   Patient presented to the emergency room with complaints of intermittent shortness of breath, especially at night. .  In the emergency room he was breathing easily.  EKG did show atrial fibrillation.  This is not a new condition for him.  Patient was mildly  tachycardic but I do not think this is causing him any difficulties.  The patient's cardiac enzymes are normal.  No signs of congestive heart failure.  At approximately 11:30 PM the patient states he was feeling well.  He is anxious to go home.  ?Jerrye Bushy causing his night time sx.  At this time there does not appear to be any evidence of an acute emergency medical condition and the patient appears stable for discharge with appropriate outpatient follow up.   Final Clinical Impressions(s) / ED Diagnoses   Final diagnoses:  Dyspnea, unspecified type    New Prescriptions New Prescriptions   No medications on file     Dorie Rank, MD 01/04/17 2324

## 2017-01-04 NOTE — Telephone Encounter (Signed)
Spoke with patient. She stated that he woke up around 230am and felt SOB. He said it felt like someone was choking him. He denied any coughing, fevers or body aches. He stated that it has not happened again since then.   He just wanted to make MW aware and see if he recommended anything.   If so, he wishes to use [facility] in [place].   MW, please advise. Thanks!

## 2017-01-04 NOTE — Telephone Encounter (Signed)
Spoke with patient. He is aware of MW's recs. Patient verbalized understanding. Nothing else needed at time of call.

## 2017-01-04 NOTE — Discharge Instructions (Signed)
Follow up with your primary doctor or pulmonary doctor as planned.  Return for fever worsening symptoms

## 2017-01-04 NOTE — ED Triage Notes (Addendum)
Patient c/o shortness of breath that started suddenly this morning at 2:30am,waking him up. Denies any chest pain. Patient states hx of asthma, copd, and afib. Per patient has three cardiac stents. Patient reports using inhaler and nebulizer treatments today with no relief. Denies any fevers or cough. Patient noted to be allergic to symbicort with shortness of breath being a side affect. Patient states he was not aware that is what he was allergic to and states he was put on symbicort 3 weeks ago by "work doctor."

## 2017-01-08 DIAGNOSIS — J449 Chronic obstructive pulmonary disease, unspecified: Secondary | ICD-10-CM | POA: Diagnosis not present

## 2017-01-08 DIAGNOSIS — J45909 Unspecified asthma, uncomplicated: Secondary | ICD-10-CM | POA: Diagnosis not present

## 2017-01-08 DIAGNOSIS — Z6827 Body mass index (BMI) 27.0-27.9, adult: Secondary | ICD-10-CM | POA: Diagnosis not present

## 2017-01-08 DIAGNOSIS — F419 Anxiety disorder, unspecified: Secondary | ICD-10-CM | POA: Diagnosis not present

## 2017-01-08 DIAGNOSIS — I4891 Unspecified atrial fibrillation: Secondary | ICD-10-CM | POA: Diagnosis not present

## 2017-01-08 DIAGNOSIS — Z299 Encounter for prophylactic measures, unspecified: Secondary | ICD-10-CM | POA: Diagnosis not present

## 2017-01-08 DIAGNOSIS — I10 Essential (primary) hypertension: Secondary | ICD-10-CM | POA: Diagnosis not present

## 2017-01-10 ENCOUNTER — Ambulatory Visit (INDEPENDENT_AMBULATORY_CARE_PROVIDER_SITE_OTHER): Payer: Medicare Other | Admitting: *Deleted

## 2017-01-10 DIAGNOSIS — Z5181 Encounter for therapeutic drug level monitoring: Secondary | ICD-10-CM | POA: Diagnosis not present

## 2017-01-10 DIAGNOSIS — I4891 Unspecified atrial fibrillation: Secondary | ICD-10-CM

## 2017-01-11 NOTE — Progress Notes (Signed)
Pt was started on Eliquis 5mg  twice daily for atrial fib on 12/06/16.    Reviewed patients medication list.  Pt is not currently on any combined P-gp and strong CYP3A4 inhibitors/inducers (ketoconazole, traconazole, ritonavir, carbamazepine, phenytoin, rifampin, St. John's wort).  Reviewed labs from 01/04/17.  SCr 1.33, Weight 89kg, CrCl 57.62.   Dose is appropriate based on age, weight, and SCr.  Hgb and HCT:  15.6/45.8   Pt denies and problems since starting Eliquis.  Denies any bleeding, increased bruising or GI upset.    A full discussion of the nature of anticoagulants has been carried out.  A benefit/risk analysis has been presented to the patient, so that they understand the justification for choosing anticoagulation with Eliquis at this time.  The need for compliance is stressed.  Pt is aware to take the medication twice daily.  Side effects of potential bleeding are discussed, including unusual colored urine or stools, coughing up blood or coffee ground emesis, nose bleeds or serious fall or head trauma.  Discussed signs and symptoms of stroke. The patient should avoid any OTC items containing aspirin or ibuprofen.  Avoid alcohol consumption.   Call if any signs of abnormal bleeding.  Discussed financial obligations and resolved any difficulty in obtaining medication.  Next lab test in 3 months.   Labs discussed with pt/placed in recall for 04/2017 Pt assistance will run out end of December.  Encouraged pt to reapply for next year.  Phone number provided.

## 2017-01-28 DIAGNOSIS — Z6827 Body mass index (BMI) 27.0-27.9, adult: Secondary | ICD-10-CM | POA: Diagnosis not present

## 2017-01-28 DIAGNOSIS — J449 Chronic obstructive pulmonary disease, unspecified: Secondary | ICD-10-CM | POA: Diagnosis not present

## 2017-01-28 DIAGNOSIS — M549 Dorsalgia, unspecified: Secondary | ICD-10-CM | POA: Diagnosis not present

## 2017-01-28 DIAGNOSIS — I10 Essential (primary) hypertension: Secondary | ICD-10-CM | POA: Diagnosis not present

## 2017-01-28 DIAGNOSIS — J45909 Unspecified asthma, uncomplicated: Secondary | ICD-10-CM | POA: Diagnosis not present

## 2017-01-28 DIAGNOSIS — Z299 Encounter for prophylactic measures, unspecified: Secondary | ICD-10-CM | POA: Diagnosis not present

## 2017-02-08 ENCOUNTER — Ambulatory Visit: Payer: Medicare Other | Admitting: Internal Medicine

## 2017-02-11 ENCOUNTER — Ambulatory Visit: Payer: Medicare Other | Admitting: Cardiology

## 2017-02-12 ENCOUNTER — Ambulatory Visit: Payer: Medicare Other | Admitting: Internal Medicine

## 2017-02-20 DIAGNOSIS — F329 Major depressive disorder, single episode, unspecified: Secondary | ICD-10-CM | POA: Diagnosis not present

## 2017-02-20 DIAGNOSIS — J449 Chronic obstructive pulmonary disease, unspecified: Secondary | ICD-10-CM | POA: Diagnosis not present

## 2017-02-20 DIAGNOSIS — Z299 Encounter for prophylactic measures, unspecified: Secondary | ICD-10-CM | POA: Diagnosis not present

## 2017-02-20 DIAGNOSIS — Z6826 Body mass index (BMI) 26.0-26.9, adult: Secondary | ICD-10-CM | POA: Diagnosis not present

## 2017-02-20 DIAGNOSIS — Z87891 Personal history of nicotine dependence: Secondary | ICD-10-CM | POA: Diagnosis not present

## 2017-02-20 DIAGNOSIS — E785 Hyperlipidemia, unspecified: Secondary | ICD-10-CM | POA: Diagnosis not present

## 2017-02-20 DIAGNOSIS — R350 Frequency of micturition: Secondary | ICD-10-CM | POA: Diagnosis not present

## 2017-02-20 DIAGNOSIS — I10 Essential (primary) hypertension: Secondary | ICD-10-CM | POA: Diagnosis not present

## 2017-03-08 ENCOUNTER — Encounter: Payer: Self-pay | Admitting: Cardiology

## 2017-03-08 ENCOUNTER — Ambulatory Visit (INDEPENDENT_AMBULATORY_CARE_PROVIDER_SITE_OTHER): Payer: Medicare Other | Admitting: Cardiology

## 2017-03-08 VITALS — BP 103/70 | HR 98 | Ht 69.0 in | Wt 176.0 lb

## 2017-03-08 DIAGNOSIS — I10 Essential (primary) hypertension: Secondary | ICD-10-CM

## 2017-03-08 DIAGNOSIS — I481 Persistent atrial fibrillation: Secondary | ICD-10-CM

## 2017-03-08 DIAGNOSIS — I4819 Other persistent atrial fibrillation: Secondary | ICD-10-CM

## 2017-03-08 NOTE — Patient Instructions (Signed)

## 2017-03-08 NOTE — Progress Notes (Signed)
Cardiology Office Note  Date: 03/08/2017   ID: Jackson Martin, DOB [birth year], MRN [id number]  PCP: Monico Blitz, MD  Primary Cardiologist: Rozann Lesches, MD   Chief Complaint  Patient presents with  . Atrial Fibrillation    History of Present Illness: Jackson Martin is a 77 y.o. male last seen in July 2018. He presents for a routine follow-up visit. Still does not feel any sense of palpitations and has had no chest pain or increasing breathlessness. He reports compliance with his medications including Cardizem CD for heart rate control.  He continues to follow in the anticoagulation clinic, now on Eliquis. He does not report any bleeding problems.  Past Medical History:  Diagnosis Date  . Anxiety   . Asthmatic bronchitis   . Atrial fibrillation ([place])   . Backache   . Colon polyps   . COPD (chronic obstructive pulmonary disease) ([place])   . Dyspnea   . Esophageal reflux   . Essential hypertension   . Gout   . Hyperlipidemia   . Ischemic heart disease   . MI (myocardial infarction) ([place]) 2003  . Neck pain   . Plantar fasciitis   . Prostate cancer [facility])     Past Surgical History:  Procedure Laterality Date  . COLONOSCOPY    . COLONOSCOPY N/A 11/15/2016   Procedure: COLONOSCOPY;  Surgeon: Rogene Houston, MD;  Location[facility];  Service: Endoscopy;  Laterality: N/A;  1200  . POLYPECTOMY  11/15/2016   Procedure: POLYPECTOMY;  Surgeon: Rogene Houston, MD;  Location: [facility]O [facility];  Service: Endoscopy;;  colon  . PROSTATECTOMY  2001    Current Outpatient Medications  Medication Sig Dispense Refill  . albuterol (PROVENTIL) (2.5 MG/3ML) 0.083% nebulizer solution Take 2.5 mg by nebulization every 6 (six) hours as needed for wheezing or shortness of breath.    . allopurinol (ZYLOPRIM) 300 MG tablet Take 300 mg by mouth daily.     Marland Kitchen apixaban (ELIQUIS) 5 MG TABS tablet Take 1 tablet (5 mg total) by mouth 2 (two) times daily. 180 tablet 4  . atorvastatin  (LIPITOR) 20 MG tablet Take 20 mg by mouth at bedtime.     . budesonide-formoterol (SYMBICORT) 80-4.5 MCG/ACT inhaler Inhale 2 puffs into the lungs 2 (two) times daily. 1 Inhaler 11  . citalopram (CELEXA) 20 MG tablet Take 20 mg by mouth daily with lunch.     . clonazePAM (KLONOPIN) 0.5 MG tablet Take 0.5 mg by mouth 2 (two) times daily as needed. For anxiety    . furosemide (LASIX) 20 MG tablet Take 20 mg by mouth daily as needed (for fluid retention.).     Marland Kitchen Melatonin 10 MG TABS Take 1 tablet by mouth at bedtime.    . mirtazapine (REMERON) 15 MG tablet Take 15 mg by mouth at bedtime.    . montelukast (SINGULAIR) 10 MG tablet Take 10 mg by mouth at bedtime.    . Potassium Chloride (KLOR-CON 10 PO) Take 10 mEq by mouth daily as needed (FOR FLUID RETENTION/WITH LASIX).     Marland Kitchen sertraline (ZOLOFT) 50 MG tablet Take 1 tablet by mouth daily.    Marland Kitchen diltiazem (CARDIZEM CD) 240 MG 24 hr capsule Take 1 capsule (240 mg total) by mouth daily. 90 capsule 3   No current facility-administered medications for this visit.    Allergies:  Patient has no known allergies.   Social History: The patient  reports that he quit smoking about 26 years ago. His  smoking use included cigarettes. He has a 2.25 pack-year smoking history. he has never used smokeless tobacco. He reports that he does not drink alcohol or use drugs.   ROS:  Please see the history of present illness. Otherwise, complete review of systems is positive for insomnia.  All other systems are reviewed and negative.   Physical Exam: VS:  BP 103/70   Pulse 98   Ht 5\' 9"  (1.753 m)   Wt 176 lb (79.8 kg)   BMI 25.99 kg/m , BMI Body mass index is 25.99 kg/m.  Wt Readings from Last 3 Encounters:  03/08/17 176 lb (79.8 kg)  01/04/17 189 lb (85.7 kg)  12/25/16 189 lb (85.7 kg)    General: Patient appears comfortable at rest. HEENT: Conjunctiva and lids normal, oropharynx clear. Neck: Supple, no elevated JVP or carotid bruits, no thyromegaly. Lungs:  Clear to auscultation, nonlabored breathing at rest. Cardiac: Irregularly irregular, no S3, soft systolic murmur. Abdomen: Soft, nontender, bowel sounds present. Extremities: No pitting edema, distal pulses 2+. Skin: Warm and dry. Musculoskeletal: No kyphosis. Neuropsychiatric: Alert and oriented x3, affect grossly appropriate.  ECG: I personally reviewed the tracing from 01/04/2017 which showed atrial fibrillation at 110 bpm, right bundle branch block, nonspecific ST changes.  Recent Labwork: 12/25/2016: Pro B Natriuretic peptide (BNP) 48.0; TSH 4.33 01/04/2017: B Natriuretic Peptide 31.0; BUN 14; Creatinine, Ser 1.33; Hemoglobin 15.6; Platelets 223; Potassium 3.5; Sodium 137   Other Studies Reviewed Today:  Echocardiogram 08/06/2016 [facility] Internal Medicine): Normal LV wall thickness with LVEF 60-65%, normal right ventricular contraction, mild left atrial enlargement, mildly sclerotic aortic valve without stenosis, mild mitral regurgitation, mild tricuspid regurgitation, no pericardial effusion.  Assessment and Plan:  1. Persistent atrial fibrillation with CHADSVASC score of 4. Continue strategy of heart rate control and anticoagulation.  2. Essential hypertension, blood pressure is well controlled today.  Current medicines were reviewed with the patient today.  Disposition: Follow-up in 6 months.  Signed, Satira Sark, MD, [facility] 03/08/2017 2:16 PM    [place] at [place], [place],  [address] Phone: [phone]; Fax: [phone]

## 2017-04-16 ENCOUNTER — Ambulatory Visit (INDEPENDENT_AMBULATORY_CARE_PROVIDER_SITE_OTHER): Payer: Medicare Other | Admitting: *Deleted

## 2017-04-16 DIAGNOSIS — Z5181 Encounter for therapeutic drug level monitoring: Secondary | ICD-10-CM

## 2017-04-16 DIAGNOSIS — I4891 Unspecified atrial fibrillation: Secondary | ICD-10-CM

## 2017-04-16 NOTE — Progress Notes (Signed)
Pt was started on Eliquis 5mg  twice daily for atrial fib on 12/06/16.    Reviewed patients medication list.  Pt is not currently on any combined P-gp and strong CYP3A4 inhibitors/inducers (ketoconazole, traconazole, ritonavir, carbamazepine, phenytoin, rifampin, St. John's wort).  Reviewed labs from 04/17/17 @ [facility] lab.  SCr 1.01, Weight 79.8kg, CrCl 68.04   Dose is appropriate based on age, weight, and SCr.  Hgb and HCT:  13.6/42.0   Pt denies and problems since starting Eliquis.  Denies any bleeding, increased bruising or GI upset.    A full discussion of the nature of anticoagulants has been carried out.  A benefit/risk analysis has been presented to the patient, so that they understand the justification for choosing anticoagulation with Eliquis at this time.  The need for compliance is stressed.  Pt is aware to take the medication twice daily.  Side effects of potential bleeding are discussed, including unusual colored urine or stools, coughing up blood or coffee ground emesis, nose bleeds or serious fall or head trauma.  Discussed signs and symptoms of stroke. The patient should avoid any OTC items containing aspirin or ibuprofen.  Avoid alcohol consumption.   Call if any signs of abnormal bleeding.  Discussed financial obligations and resolved any difficulty in obtaining medication.  Next lab test in 6 months.   Labs results called to pt by [facility] LPN.  Placed in recall for 6 months. Pt assistance forms filled out for 2019 and faxed to company.

## 2017-04-17 ENCOUNTER — Other Ambulatory Visit (HOSPITAL_COMMUNITY)
Admission: RE | Admit: 2017-04-17 | Discharge: 2017-04-17 | Disposition: A | Discharge status: Home / Self Care | Payer: Medicare Other | Source: Ambulatory Visit | Attending: Cardiology | Admitting: Cardiology

## 2017-04-17 ENCOUNTER — Telehealth: Payer: Self-pay

## 2017-04-17 DIAGNOSIS — I4891 Unspecified atrial fibrillation: Secondary | ICD-10-CM | POA: Insufficient documentation

## 2017-04-17 LAB — BASIC METABOLIC PANEL
Anion gap: 9 (ref 5–15)
BUN: 7 mg/dL (ref 6–20)
CO2: 27 mmol/L (ref 22–32)
CREATININE: 1.01 mg/dL (ref 0.61–1.24)
Calcium: 8.9 mg/dL (ref 8.9–10.3)
Chloride: 101 mmol/L (ref 101–111)
GFR calc Af Amer: >60 mL/min (ref >60)
Glucose, Bld: 99 mg/dL (ref 65–99)
Potassium: 3.9 mmol/L (ref 3.5–5.1)
SODIUM: 137 mmol/L (ref 135–145)

## 2017-04-17 LAB — CBC
HCT: 42 % (ref 39.0–52.0)
Hemoglobin: 13.6 g/dL (ref 13.0–17.0)
MCH: 31.1 pg (ref 26.0–34.0)
MCHC: 32.4 g/dL (ref 30.0–36.0)
MCV: 95.9 fL (ref 78.0–100.0)
PLATELETS: 218 10*3/uL (ref 150–400)
RBC: 4.38 MIL/uL (ref 4.22–5.81)
RDW: 13.7 % (ref 11.5–15.5)
WBC: 5.5 10*3/uL (ref 4.0–10.5)

## 2017-04-17 NOTE — Telephone Encounter (Signed)
Patient notified. Routed to PCP 

## 2017-04-17 NOTE — Telephone Encounter (Signed)
-----   Message from Acquanetta Chain, LPN sent at 03/07/1115  1:52 PM EST -----   ----- Message ----- From: Satira Sark, MD Sent: 04/17/2017  12:59 PM To: Merlene Laughter, LPN  Results reviewed.  Renal function and potassium normal range. A copy of this test should be forwarded to Monico Blitz, MD.

## 2017-05-14 DIAGNOSIS — F419 Anxiety disorder, unspecified: Secondary | ICD-10-CM | POA: Diagnosis not present

## 2017-05-14 DIAGNOSIS — E7800 Pure hypercholesterolemia, unspecified: Secondary | ICD-10-CM | POA: Diagnosis not present

## 2017-05-14 DIAGNOSIS — I10 Essential (primary) hypertension: Secondary | ICD-10-CM | POA: Diagnosis not present

## 2017-05-14 DIAGNOSIS — Z87891 Personal history of nicotine dependence: Secondary | ICD-10-CM | POA: Diagnosis not present

## 2017-05-14 DIAGNOSIS — Z6826 Body mass index (BMI) 26.0-26.9, adult: Secondary | ICD-10-CM | POA: Diagnosis not present

## 2017-05-14 DIAGNOSIS — Z299 Encounter for prophylactic measures, unspecified: Secondary | ICD-10-CM | POA: Diagnosis not present

## 2017-06-18 ENCOUNTER — Telehealth: Payer: Self-pay | Admitting: *Deleted

## 2017-06-18 NOTE — Telephone Encounter (Signed)
Called.  No answer.  LMOM for pt to call back tomorrow or I would call him back.

## 2017-06-18 NOTE — Telephone Encounter (Signed)
Jackson Martin called in regards to having his Eliquis checked.  Requested to speak with [facility] Nurse.

## 2017-06-20 NOTE — Telephone Encounter (Signed)
Spoke with pt.  Questions answered.  Due for Eliquis f/u in August 2019.  Pt aware.

## 2017-07-31 ENCOUNTER — Telehealth: Payer: Self-pay | Admitting: *Deleted

## 2017-07-31 NOTE — Telephone Encounter (Signed)
Pt is to be scheduled for tooth extracted and Dr Abigail Miyamoto wanting to know how long pt should hold Eliquis prior

## 2017-07-31 NOTE — Telephone Encounter (Signed)
Generally 24 hours off Eliquis should be adequate prior to routine tooth extraction.

## 2017-07-31 NOTE — Telephone Encounter (Signed)
Hassan Rowan from Dr [facility] office made aware

## 2017-08-09 DIAGNOSIS — I4891 Unspecified atrial fibrillation: Secondary | ICD-10-CM | POA: Diagnosis not present

## 2017-08-09 DIAGNOSIS — Z6826 Body mass index (BMI) 26.0-26.9, adult: Secondary | ICD-10-CM | POA: Diagnosis not present

## 2017-08-09 DIAGNOSIS — Z299 Encounter for prophylactic measures, unspecified: Secondary | ICD-10-CM | POA: Diagnosis not present

## 2017-08-09 DIAGNOSIS — J449 Chronic obstructive pulmonary disease, unspecified: Secondary | ICD-10-CM | POA: Diagnosis not present

## 2017-08-09 DIAGNOSIS — F419 Anxiety disorder, unspecified: Secondary | ICD-10-CM | POA: Diagnosis not present

## 2017-08-09 DIAGNOSIS — I10 Essential (primary) hypertension: Secondary | ICD-10-CM | POA: Diagnosis not present

## 2017-08-09 DIAGNOSIS — J441 Chronic obstructive pulmonary disease with (acute) exacerbation: Secondary | ICD-10-CM | POA: Diagnosis not present

## 2017-09-09 NOTE — Progress Notes (Signed)
Cardiology Office Note  Date: 09/10/2017   ID: [name], DOB [birth year], MRN [id number]  PCP: Monico Blitz, MD  Primary Cardiologist: Rozann Lesches, MD   Chief Complaint  Patient presents with  . Atrial Fibrillation    History of Present Illness: Jackson Martin is a 77 y.o. male last seen in January.  He is here for a routine follow-up visit.  He does not report any chest pain or palpitations since last visit.  He does tell me that he has had trouble with constipation, uses a laxative, and that when he has a bowel movement about every 3 days he has noticed hematochezia.  He has a visit to see Dr. Laural Golden later this week.  He reports a colonoscopy about a year ago.  He continues on Eliquis with follow-up in the anticoagulation clinic.  We are scheduling follow-up CBC and BMET.  Otherwise he remains on Cardizem CD for heart rate control.  Past Medical History:  Diagnosis Date  . Anxiety   . Asthmatic bronchitis   . Atrial fibrillation ([place])   . Backache   . Colon polyps   . COPD (chronic obstructive pulmonary disease) ([place])   . Dyspnea   . Esophageal reflux   . Essential hypertension   . Gout   . Hyperlipidemia   . Ischemic heart disease   . MI (myocardial infarction) ([place]) 2003  . Neck pain   . Plantar fasciitis   . Prostate cancer [facility])     Past Surgical History:  Procedure Laterality Date  . COLONOSCOPY    . COLONOSCOPY N/A 11/15/2016   Procedure: COLONOSCOPY;  Surgeon: Rogene Houston, MD;  [facility];  Service: Endoscopy;  Laterality: N/A;  1200  . POLYPECTOMY  11/15/2016   Procedure: POLYPECTOMY;  Surgeon: Rogene Houston, MD;  [facility];  Service: Endoscopy;;  colon  . PROSTATECTOMY  2001    Current Outpatient Medications  Medication Sig Dispense Refill  . albuterol (PROVENTIL) (2.5 MG/3ML) 0.083% nebulizer solution Take 2.5 mg by nebulization every 6 (six) hours as needed for wheezing or shortness of breath.    .  allopurinol (ZYLOPRIM) 300 MG tablet Take 300 mg by mouth daily.     Marland Kitchen apixaban (ELIQUIS) 5 MG TABS tablet Take 1 tablet (5 mg total) by mouth 2 (two) times daily. 180 tablet 4  . atorvastatin (LIPITOR) 20 MG tablet Take 20 mg by mouth at bedtime.     . budesonide-formoterol (SYMBICORT) 80-4.5 MCG/ACT inhaler Inhale 2 puffs into the lungs 2 (two) times daily. 1 Inhaler 11  . citalopram (CELEXA) 20 MG tablet Take 20 mg by mouth daily with lunch.     . clonazePAM (KLONOPIN) 0.5 MG tablet Take 0.5 mg by mouth 2 (two) times daily as needed. For anxiety    . diltiazem (CARDIZEM CD) 240 MG 24 hr capsule Take 1 capsule (240 mg total) by mouth daily. 90 capsule 3  . furosemide (LASIX) 20 MG tablet Take 20 mg by mouth daily as needed (for fluid retention.).     Marland Kitchen Melatonin 10 MG TABS Take 1 tablet by mouth at bedtime.    . mirtazapine (REMERON) 15 MG tablet Take 15 mg by mouth at bedtime.    . montelukast (SINGULAIR) 10 MG tablet Take 10 mg by mouth at bedtime.    . Potassium Chloride (KLOR-CON 10 PO) Take 10 mEq by mouth daily as needed (FOR FLUID RETENTION/WITH LASIX).     Marland Kitchen sertraline (ZOLOFT)  50 MG tablet Take 1 tablet by mouth daily.     No current facility-administered medications for this visit.    Allergies:  Patient has no known allergies.   Social History: The patient  reports that he quit smoking about 26 years ago. His smoking use included cigarettes. He has a 2.25 pack-year smoking history. He has never used smokeless tobacco. He reports that he does not drink alcohol or use drugs.   ROS:  Please see the history of present illness. Otherwise, complete review of systems is positive for none.  All other systems are reviewed and negative.   Physical Exam: VS:  BP 118/74   Pulse 81   Ht 5\' 9"  (1.753 m)   Wt 172 lb 3.2 oz (78.1 kg)   SpO2 98%   BMI 25.43 kg/m , BMI Body mass index is 25.43 kg/m.  Wt Readings from Last 3 Encounters:  09/10/17 172 lb 3.2 oz (78.1 kg)  03/08/17 176 lb  (79.8 kg)  01/04/17 189 lb (85.7 kg)    General: Patient appears comfortable at rest. HEENT: Conjunctiva and lids normal, oropharynx clear. Neck: Supple, no elevated JVP or carotid bruits, no thyromegaly. Lungs: Clear to auscultation, nonlabored breathing at rest. Cardiac: Irregularly irregular, no S3, soft significant systolic murmur. Abdomen: Soft, nontender, bowel sounds present. Extremities: No pitting edema, distal pulses 2+. Skin: Warm and dry. Musculoskeletal: No kyphosis. Neuropsychiatric: Alert and oriented x3, affect grossly appropriate.  ECG: I personally reviewed the tracing from 01/04/2017 which showed atrial fibrillation with right bundle branch block and nonspecific ST changes.  Recent Labwork: 12/25/2016: Pro B Natriuretic peptide (BNP) 48.0; TSH 4.33 01/04/2017: B Natriuretic Peptide 31.0 04/17/2017: BUN 7; Creatinine, Ser 1.01; Hemoglobin 13.6; Platelets 218; Potassium 3.9; Sodium 137   Other Studies Reviewed Today:  Echocardiogram 08/06/2016 [facility]): Normal LV wall thickness with LVEF 60-65%, normal right ventricular contraction, mild left atrial enlargement, mildly sclerotic aortic valve without stenosis, mild mitral regurgitation, mild tricuspid regurgitation, no pericardial effusion.  Assessment and Plan:  1.  Persistent atrial fibrillation with CHADSVASC score of 4.  Plan to continue current regimen including Eliquis and Cardizem CD for now.  Follow-up CBC and BMET.  With recently reported hematochezia he has visit to see Dr. Laural Golden.   2.  Essential hypertension, blood pressure is well controlled today.  Current medicines were reviewed with the patient today.   Orders Placed This Encounter  Procedures  . CBC  . Basic metabolic panel    Disposition: Follow-up in 6 months, sooner if needed.  Signed, Satira Sark, MD, [facility] 09/10/2017 1:55 PM    [address] [facility] [address], [place], [place] [address] Phone:  [phone]; Fax: [phone]

## 2017-09-10 ENCOUNTER — Ambulatory Visit (INDEPENDENT_AMBULATORY_CARE_PROVIDER_SITE_OTHER): Payer: Medicare Other | Admitting: Cardiology

## 2017-09-10 ENCOUNTER — Encounter: Payer: Self-pay | Admitting: Cardiology

## 2017-09-10 VITALS — BP 118/74 | HR 81 | Ht 69.0 in | Wt 172.2 lb

## 2017-09-10 DIAGNOSIS — I10 Essential (primary) hypertension: Secondary | ICD-10-CM | POA: Diagnosis not present

## 2017-09-10 DIAGNOSIS — I481 Persistent atrial fibrillation: Secondary | ICD-10-CM

## 2017-09-10 DIAGNOSIS — I4819 Other persistent atrial fibrillation: Secondary | ICD-10-CM

## 2017-09-10 NOTE — Patient Instructions (Signed)
Medication Instructions:  Your physician recommends that you continue on your current medications as directed. Please refer to the Current Medication list given to you today.  Labwork: CBC/BMET Orders given today   Testing/Procedures: NONE  Follow-Up: Your physician wants you to follow-up in: [place]. You will receive a reminder letter in the mail two months in advance. If you don't receive a letter, please call our office to schedule the follow-up appointment.  Any Other Special Instructions Will Be Listed Below (If Applicable).  If you need a refill on your cardiac medications before your next appointment, please call your pharmacy.

## 2017-09-11 ENCOUNTER — Other Ambulatory Visit (HOSPITAL_COMMUNITY)
Admission: RE | Admit: 2017-09-11 | Discharge: 2017-09-11 | Disposition: A | Discharge status: Home / Self Care | Payer: Medicare Other | Source: Ambulatory Visit | Attending: Cardiology | Admitting: Cardiology

## 2017-09-11 DIAGNOSIS — I481 Persistent atrial fibrillation: Secondary | ICD-10-CM | POA: Diagnosis not present

## 2017-09-11 LAB — BASIC METABOLIC PANEL
Anion gap: 6 (ref 5–15)
BUN: 9 mg/dL (ref 8–23)
CALCIUM: 8.8 mg/dL — AB (ref 8.9–10.3)
CHLORIDE: 105 mmol/L (ref 98–111)
CO2: 29 mmol/L (ref 22–32)
CREATININE: 1.09 mg/dL (ref 0.61–1.24)
GFR calc non Af Amer: >60 mL/min (ref >60)
Glucose, Bld: 97 mg/dL (ref 70–99)
Potassium: 3.9 mmol/L (ref 3.5–5.1)
Sodium: 140 mmol/L (ref 135–145)

## 2017-09-11 LAB — CBC
HCT: 41.3 % (ref 39.0–52.0)
Hemoglobin: 13.7 g/dL (ref 13.0–17.0)
MCH: 30.9 pg (ref 26.0–34.0)
MCHC: 33.2 g/dL (ref 30.0–36.0)
MCV: 93 fL (ref 78.0–100.0)
PLATELETS: 171 10*3/uL (ref 150–400)
RBC: 4.44 MIL/uL (ref 4.22–5.81)
RDW: 13.5 % (ref 11.5–15.5)
WBC: 4.6 10*3/uL (ref 4.0–10.5)

## 2017-09-12 ENCOUNTER — Telehealth: Payer: Self-pay

## 2017-09-12 ENCOUNTER — Ambulatory Visit (INDEPENDENT_AMBULATORY_CARE_PROVIDER_SITE_OTHER): Payer: Medicare Other | Admitting: Internal Medicine

## 2017-09-12 ENCOUNTER — Telehealth (INDEPENDENT_AMBULATORY_CARE_PROVIDER_SITE_OTHER): Payer: Self-pay | Admitting: Internal Medicine

## 2017-09-12 ENCOUNTER — Other Ambulatory Visit (INDEPENDENT_AMBULATORY_CARE_PROVIDER_SITE_OTHER): Payer: Self-pay | Admitting: Internal Medicine

## 2017-09-12 ENCOUNTER — Encounter (INDEPENDENT_AMBULATORY_CARE_PROVIDER_SITE_OTHER): Payer: Self-pay | Admitting: Internal Medicine

## 2017-09-12 VITALS — BP 136/90 | HR 64 | Temp 97.6°F | Ht 69.0 in | Wt 170.6 lb

## 2017-09-12 DIAGNOSIS — K640 First degree hemorrhoids: Secondary | ICD-10-CM

## 2017-09-12 MED ORDER — HYDROCORTISONE ACE-PRAMOXINE 1-1 % RE FOAM: 1.0000 | Freq: Two times a day (BID) | RECTAL | 0 refills | Status: DC

## 2017-09-12 MED ORDER — HYDROCORTISONE ACETATE 25 MG RE SUPP: 25.0000 mg | Freq: Every day | RECTAL | 1 refills | Status: DC

## 2017-09-12 NOTE — Progress Notes (Signed)
Talk with patient's pharmacist and prescription changed to Anusol [facility] and 14 would cost him her co-pay would be $36. Pharmacy to call patient.

## 2017-09-12 NOTE — Patient Instructions (Addendum)
Rx for Proctofoam called to his pharmacy.  Call me when u are finished medication and let me know how u are doing.

## 2017-09-12 NOTE — Telephone Encounter (Signed)
Patient notified. Routed to PCP 

## 2017-09-12 NOTE — Telephone Encounter (Signed)
Patient called stated his insurance would not pay for the proctofoam prescriptioin - please call patient [phone]

## 2017-09-12 NOTE — Telephone Encounter (Signed)
-----   Message from Satira Sark, MD sent at 09/12/2017  8:48 AM EDT ----- Results reviewed.  Renal function is stable as his hemoglobin which is normal at 13.7.  Continue with current plan. A copy of this test should be forwarded to Monico Blitz, MD.

## 2017-09-12 NOTE — Progress Notes (Addendum)
Subjective:    Patient ID: Jackson Martin, male    DOB: [birth year], 77 y.o.   MRN: [id number]  HPI Presents today with rectal bleeding.   C/o  BM  every 2-3 days. When he has a BM, he has seen rectal bleeding. Symptoms x 3 weeks. Appetite is good. No weight loss. His last colonoscopy in 2018 (see below).  No family hx of colon cancer.  Eliquis for atrial fib.   His last colonoscopy was in September of 2018. Nine sessile polyps sigmoid, splenic flexure, transverse colon. All were small.  All polyps are tubular adenomas. External hemorrhoids. Results reviewed with patient. Report to PCP. Next colonoscopy in 3 years. Review of Systems Past Medical History:  Diagnosis Date  . Anxiety   . Asthmatic bronchitis   . Atrial fibrillation ([place])   . Backache   . Colon polyps   . COPD (chronic obstructive pulmonary disease) ([place])   . Dyspnea   . Esophageal reflux   . Essential hypertension   . Gout   . Hyperlipidemia   . Ischemic heart disease   . MI (myocardial infarction) ([place]) 2003  . Neck pain   . Plantar fasciitis   . Prostate cancer [facility])     Past Surgical History:  Procedure Laterality Date  . COLONOSCOPY    . COLONOSCOPY N/A 11/15/2016   Procedure: COLONOSCOPY;  Surgeon: Rogene Houston, MD;  [facility];  Service: Endoscopy;  Laterality: N/A;  1200  . POLYPECTOMY  11/15/2016   Procedure: POLYPECTOMY;  Surgeon: Rogene Houston, MD;  [facility];  Service: Endoscopy;;  colon  . PROSTATECTOMY  2001    No Known Allergies  Current Outpatient Medications on File Prior to Visit  Medication Sig Dispense Refill  . albuterol (PROVENTIL) (2.5 MG/3ML) 0.083% nebulizer solution Take 2.5 mg by nebulization every 6 (six) hours as needed for wheezing or shortness of breath.    . allopurinol (ZYLOPRIM) 300 MG tablet Take 300 mg by mouth daily.     Marland Kitchen apixaban (ELIQUIS) 5 MG TABS tablet Take 1 tablet (5 mg total) by mouth 2 (two) times daily. 180 tablet 4    . atorvastatin (LIPITOR) 20 MG tablet Take 20 mg by mouth at bedtime.     . budesonide-formoterol (SYMBICORT) 80-4.5 MCG/ACT inhaler Inhale 2 puffs into the lungs 2 (two) times daily. 1 Inhaler 11  . citalopram (CELEXA) 20 MG tablet Take 20 mg by mouth daily with lunch.     . clonazePAM (KLONOPIN) 0.5 MG tablet Take 0.5 mg by mouth 2 (two) times daily as needed. For anxiety    . diltiazem (CARDIZEM CD) 240 MG 24 hr capsule Take 1 capsule (240 mg total) by mouth daily. 90 capsule 3  . furosemide (LASIX) 20 MG tablet Take 20 mg by mouth daily as needed (for fluid retention.).     Marland Kitchen Melatonin 10 MG TABS Take 1 tablet by mouth at bedtime.    . mirtazapine (REMERON) 15 MG tablet Take 15 mg by mouth at bedtime.    . montelukast (SINGULAIR) 10 MG tablet Take 10 mg by mouth at bedtime.    . Potassium Chloride (KLOR-CON 10 PO) Take 10 mEq by mouth daily as needed (FOR FLUID RETENTION/WITH LASIX).     Marland Kitchen sertraline (ZOLOFT) 50 MG tablet Take 1 tablet by mouth daily.     No current facility-administered medications on file prior to visit.         Objective:   Physical  Exam Blood pressure 136/90, pulse 64, temperature 97.6 F (36.4 C), height 5\' 9"  (1.753 m), weight 170 lb 9.6 oz (77.4 kg). Alert and oriented. Skin warm and dry. Oral mucosa is moist.   . Sclera anicteric, conjunctivae is pink. Thyroid not enlarged. No cervical lymphadenopathy. Lungs clear. Heart regular rate and rhythm.  Abdomen is soft. Bowel sounds are positive. No hepatomegaly. No abdominal masses felt. No tenderness.  No edema to lower extremities.  Large hemorrhoids noted. One appears thrombosed. Blood noted on toilet tissue.           Assessment & Plan:  Rectal bleeding: hemorrhoidal.  Rx for Proctofoam called to his pharmacy,.  PR in 2 weeks.

## 2017-09-13 ENCOUNTER — Telehealth (INDEPENDENT_AMBULATORY_CARE_PROVIDER_SITE_OTHER): Payer: Self-pay | Admitting: Internal Medicine

## 2017-09-13 DIAGNOSIS — K640 First degree hemorrhoids: Secondary | ICD-10-CM

## 2017-09-13 MED ORDER — HYDROCORTISONE 2.5 % RE CREA: 1.0000 "application " | TOPICAL_CREAM | Freq: Two times a day (BID) | RECTAL | 1 refills | Status: DC

## 2017-09-13 NOTE — Telephone Encounter (Signed)
Rx for Anusol cream to his pharmacy

## 2017-09-13 NOTE — Telephone Encounter (Signed)
Rx for Anusol sent to pharmacy 

## 2017-09-16 ENCOUNTER — Emergency Department (HOSPITAL_COMMUNITY)
Admission: EM | Admit: 2017-09-16 | Discharge: 2017-09-16 | Disposition: A | Discharge status: Home / Self Care | Payer: Medicare Other | Attending: Emergency Medicine | Admitting: Emergency Medicine

## 2017-09-16 ENCOUNTER — Encounter (HOSPITAL_COMMUNITY): Payer: Self-pay | Admitting: Emergency Medicine

## 2017-09-16 DIAGNOSIS — K644 Residual hemorrhoidal skin tags: Secondary | ICD-10-CM | POA: Diagnosis not present

## 2017-09-16 DIAGNOSIS — I10 Essential (primary) hypertension: Secondary | ICD-10-CM | POA: Insufficient documentation

## 2017-09-16 DIAGNOSIS — J449 Chronic obstructive pulmonary disease, unspecified: Secondary | ICD-10-CM | POA: Diagnosis not present

## 2017-09-16 DIAGNOSIS — K625 Hemorrhage of anus and rectum: Secondary | ICD-10-CM

## 2017-09-16 DIAGNOSIS — Z79899 Other long term (current) drug therapy: Secondary | ICD-10-CM | POA: Insufficient documentation

## 2017-09-16 DIAGNOSIS — Z7901 Long term (current) use of anticoagulants: Secondary | ICD-10-CM | POA: Diagnosis not present

## 2017-09-16 DIAGNOSIS — K649 Unspecified hemorrhoids: Secondary | ICD-10-CM

## 2017-09-16 DIAGNOSIS — Z87891 Personal history of nicotine dependence: Secondary | ICD-10-CM | POA: Diagnosis not present

## 2017-09-16 LAB — COMPREHENSIVE METABOLIC PANEL
ALT: 12 U/L (ref 0–44)
AST: 19 U/L (ref 15–41)
Albumin: 3.7 g/dL (ref 3.5–5.0)
Alkaline Phosphatase: 104 U/L (ref 38–126)
Anion gap: 8 (ref 5–15)
BUN: 10 mg/dL (ref 8–23)
CO2: 30 mmol/L (ref 22–32)
Calcium: 9.1 mg/dL (ref 8.9–10.3)
Chloride: 102 mmol/L (ref 98–111)
Creatinine, Ser: 1.17 mg/dL (ref 0.61–1.24)
GFR calc Af Amer: >60 mL/min (ref >60)
GFR calc non Af Amer: 58 mL/min — ABNORMAL LOW (ref >60)
Glucose, Bld: 121 mg/dL — ABNORMAL HIGH (ref 70–99)
Potassium: 4.1 mmol/L (ref 3.5–5.1)
Sodium: 140 mmol/L (ref 135–145)
Total Bilirubin: 1.2 mg/dL (ref 0.3–1.2)
Total Protein: 6.7 g/dL (ref 6.5–8.1)

## 2017-09-16 LAB — CBC
HCT: 41.6 % (ref 39.0–52.0)
Hemoglobin: 14.2 g/dL (ref 13.0–17.0)
MCH: 32.1 pg (ref 26.0–34.0)
MCHC: 34.1 g/dL (ref 30.0–36.0)
MCV: 93.9 fL (ref 78.0–100.0)
Platelets: 184 10*3/uL (ref 150–400)
RBC: 4.43 MIL/uL (ref 4.22–5.81)
RDW: 13.3 % (ref 11.5–15.5)
WBC: 4.8 10*3/uL (ref 4.0–10.5)

## 2017-09-16 LAB — TYPE AND SCREEN
ABO/RH(D): O NEG
Antibody Screen: NEGATIVE

## 2017-09-16 NOTE — ED Triage Notes (Signed)
Patient states he has hemorrhoids and started having rectal bleeding Friday. Denies pain.

## 2017-09-20 ENCOUNTER — Other Ambulatory Visit: Payer: Self-pay | Admitting: Cardiology

## 2017-09-23 ENCOUNTER — Other Ambulatory Visit (INDEPENDENT_AMBULATORY_CARE_PROVIDER_SITE_OTHER): Payer: Self-pay | Admitting: *Deleted

## 2017-09-23 DIAGNOSIS — K921 Melena: Secondary | ICD-10-CM | POA: Insufficient documentation

## 2017-09-24 ENCOUNTER — Ambulatory Visit (INDEPENDENT_AMBULATORY_CARE_PROVIDER_SITE_OTHER): Payer: Medicare Other | Admitting: Internal Medicine

## 2017-09-24 ENCOUNTER — Encounter (INDEPENDENT_AMBULATORY_CARE_PROVIDER_SITE_OTHER): Payer: Self-pay | Admitting: Internal Medicine

## 2017-09-24 VITALS — BP 120/84 | HR 96 | Temp 97.9°F | Ht 69.0 in | Wt 168.8 lb

## 2017-09-24 DIAGNOSIS — K640 First degree hemorrhoids: Secondary | ICD-10-CM

## 2017-09-24 MED ORDER — HYDROCORTISONE ACETATE 25 MG RE SUPP: 25.0000 mg | Freq: Two times a day (BID) | RECTAL | 1 refills | Status: DC

## 2017-09-24 NOTE — Progress Notes (Signed)
Subjective:    Patient ID: Jackson Martin, male    DOB: January 08, 1941, 77 y.o.   MRN: [id number]  HPI Here today for f/u. Last seen earlier this month with rectal bleeding. Had had symptoms for about 3 weeks.  Noted to have external hemorrhoids. One appeared thrombosed. Given an Rx for Anusol supp.  Appetite is okay. Lost 1 pound since his last visit.  Has a BM every 3 days.    Hx of atrial fib and maintained on Eliquis.    CBC    Component Value Date/Time   WBC 4.8 09/16/2017 1205   RBC 4.43 09/16/2017 1205   HGB 14.2 09/16/2017 1205   HCT 41.6 09/16/2017 1205   PLT 184 09/16/2017 1205   MCV 93.9 09/16/2017 1205   MCH 32.1 09/16/2017 1205   MCHC 34.1 09/16/2017 1205   RDW 13.3 09/16/2017 1205   LYMPHSABS 1.4 12/25/2016 1553   MONOABS 0.5 12/25/2016 1553   EOSABS 0.2 12/25/2016 1553   BASOSABS 0.1 12/25/2016 1553    His last colonoscopy was in September of 2018. Nine sessile polyps sigmoid, splenic flexure, transverse colon. All were small.  All polyps are tubular adenomas. External hemorrhoids. Results reviewed with patient. Report to PCP. Next colonoscopy in 3 years.   Review of Systems Past Medical History:  Diagnosis Date  . Anxiety   . Asthmatic bronchitis   . Atrial fibrillation ([place])   . Backache   . Colon polyps   . COPD (chronic obstructive pulmonary disease) ([place])   . Dyspnea   . Esophageal reflux   . Essential hypertension   . Gout   . Hyperlipidemia   . Ischemic heart disease   . MI (myocardial infarction) ([place]) 2003  . Neck pain   . Plantar fasciitis   . Prostate cancer [facility])     Past Surgical History:  Procedure Laterality Date  . COLONOSCOPY    . COLONOSCOPY N/A 11/15/2016   Procedure: COLONOSCOPY;  Surgeon: Rogene Houston, MD;  [facility];  Service: Endoscopy;  Laterality: N/A;  1200  . POLYPECTOMY  11/15/2016   Procedure: POLYPECTOMY;  Surgeon: Rogene Houston, MD;  [facility];  Service: Endoscopy;;   colon  . PROSTATECTOMY  2001    No Known Allergies  Current Outpatient Medications on File Prior to Visit  Medication Sig Dispense Refill  . albuterol (PROVENTIL) (2.5 MG/3ML) 0.083% nebulizer solution Take 2.5 mg by nebulization every 6 (six) hours as needed for wheezing or shortness of breath.    . allopurinol (ZYLOPRIM) 300 MG tablet Take 300 mg by mouth daily.     Marland Kitchen apixaban (ELIQUIS) 5 MG TABS tablet Take 1 tablet (5 mg total) by mouth 2 (two) times daily. 180 tablet 4  . atorvastatin (LIPITOR) 20 MG tablet Take 20 mg by mouth at bedtime.     . budesonide-formoterol (SYMBICORT) 80-4.5 MCG/ACT inhaler Inhale 2 puffs into the lungs 2 (two) times daily. 1 Inhaler 11  . CARTIA XT 240 MG 24 hr capsule TAKE 1 CAPSULE BY MOUTH ONCE DAILY 90 capsule 1  . citalopram (CELEXA) 20 MG tablet Take 20 mg by mouth daily with lunch.     . clonazePAM (KLONOPIN) 0.5 MG tablet Take 0.5 mg by mouth 2 (two) times daily as needed. For anxiety    . furosemide (LASIX) 20 MG tablet Take 20 mg by mouth daily as needed (for fluid retention.).     Marland Kitchen hydrocortisone (ANUSOL-HC) 25 MG suppository Place 1 suppository (  25 mg total) rectally at bedtime. 14 suppository 1  . Melatonin 10 MG TABS Take 1 tablet by mouth at bedtime.    . mirtazapine (REMERON) 15 MG tablet Take 15 mg by mouth at bedtime.    . montelukast (SINGULAIR) 10 MG tablet Take 10 mg by mouth at bedtime.    . Potassium Chloride (KLOR-CON 10 PO) Take 10 mEq by mouth daily as needed (FOR FLUID RETENTION/WITH LASIX).     Marland Kitchen sertraline (ZOLOFT) 50 MG tablet Take 1 tablet by mouth daily.     No current facility-administered medications on file prior to visit.         Objective:   Physical Exam Blood pressure 120/84, pulse 96, temperature 97.9 F (36.6 C), height 5\' 9"  (1.753 m), weight 168 lb 12.8 oz (76.6 kg). Alert and oriented. Skin warm and dry. Oral mucosa is moist.   . Sclera anicteric, conjunctivae is pink. Thyroid not enlarged. No cervical  lymphadenopathy. Lungs clear. Heart regular rate and rhythm.  Abdomen is soft. Bowel sounds are positive. No hepatomegaly. No abdominal masses felt. No tenderness.  No edema to lower extremities.  Hemorrhoids noted which appear much better. No thrombosis.          Assessment & Plan:  Hemorrhoids. Looks better today. One reddened swollen hemorrhoid. Rx for Anusol sent to his supp.

## 2017-09-24 NOTE — Patient Instructions (Signed)
Rx for Anusol supp twice a day. Stool softener.

## 2017-09-26 ENCOUNTER — Ambulatory Visit (INDEPENDENT_AMBULATORY_CARE_PROVIDER_SITE_OTHER): Payer: Medicare Other

## 2017-09-26 NOTE — ED Provider Notes (Signed)
[facility] EMERGENCY DEPARTMENT Provider Note   CSN: [id number] Arrival date & time: 09/16/17  1119     History   Chief Complaint Chief Complaint  Patient presents with  . Rectal Bleeding    HPI Jackson Martin is a 77 y.o. male.  HPI   77 year old male with rectal bleeding.  He has known hemorrhoids.  Says he began having some bleeding this past Friday and is very concerned.  Small amount of bright red blood when he wipes.  He is very concerned because his brother had a history of GI cancer.  He reports that his prior colonoscopies have been okay.  Past Medical History:  Diagnosis Date  . Anxiety   . Asthmatic bronchitis   . Atrial fibrillation ([place])   . Backache   . Colon polyps   . COPD (chronic obstructive pulmonary disease) ([place])   . Dyspnea   . Esophageal reflux   . Essential hypertension   . Gout   . Hyperlipidemia   . Ischemic heart disease   . MI (myocardial infarction) ([place]) 2003  . Neck pain   . Plantar fasciitis   . Prostate cancer [facility])     Patient Active Problem List   Diagnosis Date Noted  . Blood in stool 09/23/2017  . Dyspnea on exertion 12/25/2016  . COPD  GOLD 0/ ? AB 12/25/2016  . Gastrointestinal hemorrhage 10/29/2016    Past Surgical History:  Procedure Laterality Date  . COLONOSCOPY    . COLONOSCOPY N/A 11/15/2016   Procedure: COLONOSCOPY;  Surgeon: Rogene Houston, MD;  Location: [facility]O [facility];  Service: Endoscopy;  Laterality: N/A;  1200  . POLYPECTOMY  11/15/2016   Procedure: POLYPECTOMY;  Surgeon: Rogene Houston, MD;  Location: [facility] ENDO SUITE;  Service: Endoscopy;;  colon  . PROSTATECTOMY  2001        Home Medications    Prior to Admission medications   Medication Sig Start Date End Date Taking? Authorizing Provider  albuterol (PROVENTIL) (2.5 MG/3ML) 0.083% nebulizer solution Take 2.5 mg by nebulization every 6 (six) hours as needed for wheezing or shortness of breath.   Yes [provider]  allopurinol  (ZYLOPRIM) 300 MG tablet Take 300 mg by mouth daily.    Yes [provider]  apixaban (ELIQUIS) 5 MG TABS tablet Take 1 tablet (5 mg total) by mouth 2 (two) times daily. 11/09/16  Yes Satira Sark, MD  atorvastatin (LIPITOR) 20 MG tablet Take 20 mg by mouth at bedtime.    Yes [provider]  budesonide-formoterol (SYMBICORT) 80-4.5 MCG/ACT inhaler Inhale 2 puffs into the lungs 2 (two) times daily. 12/25/16  Yes Tanda Rockers, MD  citalopram (CELEXA) 20 MG tablet Take 20 mg by mouth daily with lunch.    Yes [provider]  clonazePAM (KLONOPIN) 0.5 MG tablet Take 0.5 mg by mouth 2 (two) times daily as needed. For anxiety 10/19/16  Yes [provider]  furosemide (LASIX) 20 MG tablet Take 20 mg by mouth daily as needed (for fluid retention.).    Yes [provider]  hydrocortisone (ANUSOL-HC) 25 MG suppository Place 1 suppository (25 mg total) rectally at bedtime. 09/12/17  Yes Rehman, Mechele Dawley, MD  Melatonin 10 MG TABS Take 1 tablet by mouth at bedtime.   Yes [provider]  mirtazapine (REMERON) 15 MG tablet Take 15 mg by mouth at bedtime.   Yes [provider]  montelukast (SINGULAIR) 10 MG tablet Take 10 mg by mouth at  bedtime.   Yes [provider]  Potassium Chloride (KLOR-CON 10 PO) Take 10 mEq by mouth daily as needed (FOR FLUID RETENTION/WITH LASIX).    Yes [provider]  sertraline (ZOLOFT) 50 MG tablet Take 1 tablet by mouth daily. 02/20/17  Yes [provider]  CARTIA XT 240 MG 24 hr capsule TAKE 1 CAPSULE BY MOUTH ONCE DAILY 09/20/17   Herminio Commons, MD  hydrocortisone (ANUSOL-HC) 25 MG suppository Place 1 suppository (25 mg total) rectally every 12 (twelve) hours. 09/24/17   Setzer, Rona Ravens, NP    Family History Family History  Problem Relation Age of Onset  . Hypertension Mother   . Heart disease Mother   . Heart disease Maternal Aunt   . Heart disease Maternal Uncle   . Heart  disease Maternal Grandmother   . Heart disease Maternal Grandfather   . Colon cancer Brother     Social History Social History   Tobacco Use  . Smoking status: Former Smoker    Packs/day: 0.75    Years: 3.00    Pack years: 2.25    Types: Cigarettes    Last attempt to quit: 03/06/1991    Years since quitting: 26.5  . Smokeless tobacco: Never Used  Substance Use Topics  . Alcohol use: No  . Drug use: No     Allergies   Patient has no known allergies.   Review of Systems Review of Systems  All systems reviewed and negative, other than as noted in HPI.  Physical Exam Updated Vital Signs BP (!) 149/94   Pulse 85   Temp 98.7 F (37.1 C) (Oral)   Resp 18   Ht 5\' 9"  (1.753 m)   Wt 78 kg (172 lb)   SpO2 100%   BMI 25.40 kg/m   Physical Exam  Constitutional: He appears well-developed and well-nourished. No distress.  HENT:  Head: Normocephalic and atraumatic.  Eyes: Conjunctivae are normal. Right eye exhibits no discharge. Left eye exhibits no discharge.  Neck: Neck supple.  Cardiovascular: Normal rate, regular rhythm and normal heart sounds. Exam reveals no gallop and no friction rub.  No murmur heard. Pulmonary/Chest: Effort normal and breath sounds normal. No respiratory distress.  Abdominal: Soft. He exhibits no distension. There is no tenderness.  Genitourinary:  Genitourinary Comments: External hemorrhoids with scant blood noted.  Musculoskeletal: He exhibits no edema or tenderness.  Neurological: He is alert.  Skin: Skin is warm and dry.  Psychiatric: He has a normal mood and affect. His behavior is normal. Thought content normal.  Nursing note and vitals reviewed.    ED Treatments / Results  Labs (all labs ordered are listed, but only abnormal results are displayed) Labs Reviewed  COMPREHENSIVE METABOLIC PANEL - Abnormal; Notable for the following components:      Result Value   Glucose, Bld 121 (*)    GFR calc non Af Amer 58 (*)    All other  components within normal limits  CBC  TYPE AND SCREEN    EKG None  Radiology No results found.  Procedures Procedures (including critical care time)  Medications Ordered in ED Medications - No data to display   Initial Impression / Assessment and Plan / ED Course  I have reviewed the triage vital signs and the nursing notes.  Pertinent labs & imaging results that were available during my care of the patient were reviewed by me and considered in my medical decision making (see chart for details).    77 year old  male with some mild bleeding from external hemorrhoids.  I feel that there is some anxiety component given his brother's past history.  Reassurance was provided.  Return precautions discussed.  Outpatient follow-up otherwise.  Final Clinical Impressions(s) / ED Diagnoses   Final diagnoses:  Rectal bleeding  Hemorrhoids, unspecified hemorrhoid type    ED Discharge Orders    None       Virgel Manifold, MD 09/26/17 [phone]

## 2017-09-27 DIAGNOSIS — Z6825 Body mass index (BMI) 25.0-25.9, adult: Secondary | ICD-10-CM | POA: Diagnosis not present

## 2017-09-27 DIAGNOSIS — J449 Chronic obstructive pulmonary disease, unspecified: Secondary | ICD-10-CM | POA: Diagnosis not present

## 2017-09-27 DIAGNOSIS — Z1339 Encounter for screening examination for other mental health and behavioral disorders: Secondary | ICD-10-CM | POA: Diagnosis not present

## 2017-09-27 DIAGNOSIS — Z0000 Encounter for general adult medical examination without abnormal findings: Secondary | ICD-10-CM | POA: Diagnosis not present

## 2017-09-27 DIAGNOSIS — Z299 Encounter for prophylactic measures, unspecified: Secondary | ICD-10-CM | POA: Diagnosis not present

## 2017-09-27 DIAGNOSIS — E7800 Pure hypercholesterolemia, unspecified: Secondary | ICD-10-CM | POA: Diagnosis not present

## 2017-09-27 DIAGNOSIS — Z7189 Other specified counseling: Secondary | ICD-10-CM | POA: Diagnosis not present

## 2017-09-27 DIAGNOSIS — Z1331 Encounter for screening for depression: Secondary | ICD-10-CM | POA: Diagnosis not present

## 2017-09-27 DIAGNOSIS — I10 Essential (primary) hypertension: Secondary | ICD-10-CM | POA: Diagnosis not present

## 2017-09-27 DIAGNOSIS — R5383 Other fatigue: Secondary | ICD-10-CM | POA: Diagnosis not present

## 2017-09-27 DIAGNOSIS — Z1211 Encounter for screening for malignant neoplasm of colon: Secondary | ICD-10-CM | POA: Diagnosis not present

## 2017-09-28 ENCOUNTER — Other Ambulatory Visit: Payer: Self-pay

## 2017-09-28 ENCOUNTER — Encounter (HOSPITAL_COMMUNITY): Payer: Self-pay | Admitting: Emergency Medicine

## 2017-09-28 ENCOUNTER — Emergency Department (HOSPITAL_COMMUNITY): Payer: Medicare Other

## 2017-09-28 ENCOUNTER — Emergency Department (HOSPITAL_COMMUNITY)
Admission: EM | Admit: 2017-09-28 | Discharge: 2017-09-28 | Disposition: A | Discharge status: Home / Self Care | Payer: Medicare Other | Attending: Emergency Medicine | Admitting: Emergency Medicine

## 2017-09-28 DIAGNOSIS — Z79899 Other long term (current) drug therapy: Secondary | ICD-10-CM | POA: Diagnosis not present

## 2017-09-28 DIAGNOSIS — Z87891 Personal history of nicotine dependence: Secondary | ICD-10-CM | POA: Insufficient documentation

## 2017-09-28 DIAGNOSIS — K645 Perianal venous thrombosis: Secondary | ICD-10-CM | POA: Insufficient documentation

## 2017-09-28 DIAGNOSIS — J449 Chronic obstructive pulmonary disease, unspecified: Secondary | ICD-10-CM | POA: Insufficient documentation

## 2017-09-28 DIAGNOSIS — R109 Unspecified abdominal pain: Secondary | ICD-10-CM

## 2017-09-28 DIAGNOSIS — Z7901 Long term (current) use of anticoagulants: Secondary | ICD-10-CM | POA: Insufficient documentation

## 2017-09-28 DIAGNOSIS — R1084 Generalized abdominal pain: Secondary | ICD-10-CM | POA: Diagnosis not present

## 2017-09-28 DIAGNOSIS — K5730 Diverticulosis of large intestine without perforation or abscess without bleeding: Secondary | ICD-10-CM | POA: Diagnosis not present

## 2017-09-28 DIAGNOSIS — I10 Essential (primary) hypertension: Secondary | ICD-10-CM | POA: Diagnosis not present

## 2017-09-28 DIAGNOSIS — K644 Residual hemorrhoidal skin tags: Secondary | ICD-10-CM

## 2017-09-28 DIAGNOSIS — K625 Hemorrhage of anus and rectum: Secondary | ICD-10-CM | POA: Insufficient documentation

## 2017-09-28 DIAGNOSIS — R911 Solitary pulmonary nodule: Secondary | ICD-10-CM | POA: Diagnosis not present

## 2017-09-28 HISTORY — DX: Insomnia, unspecified: G47.00

## 2017-09-28 HISTORY — DX: Unspecified hemorrhoids: K64.9

## 2017-09-28 LAB — COMPREHENSIVE METABOLIC PANEL
ALBUMIN: 3.6 g/dL (ref 3.5–5.0)
ALK PHOS: 109 U/L (ref 38–126)
ALT: 11 U/L (ref 0–44)
ANION GAP: 7 (ref 5–15)
AST: 20 U/L (ref 15–41)
BILIRUBIN TOTAL: 1.2 mg/dL (ref 0.3–1.2)
BUN: 7 mg/dL — ABNORMAL LOW (ref 8–23)
CALCIUM: 8.8 mg/dL — AB (ref 8.9–10.3)
CO2: 27 mmol/L (ref 22–32)
Chloride: 104 mmol/L (ref 98–111)
Creatinine, Ser: 1.12 mg/dL (ref 0.61–1.24)
Glucose, Bld: 98 mg/dL (ref 70–99)
POTASSIUM: 3.6 mmol/L (ref 3.5–5.1)
SODIUM: 138 mmol/L (ref 135–145)
TOTAL PROTEIN: 6.4 g/dL — AB (ref 6.5–8.1)

## 2017-09-28 LAB — CBC WITH DIFFERENTIAL/PLATELET
BASOS ABS: 0.1 10*3/uL (ref 0.0–0.1)
BASOS PCT: 1 %
EOS ABS: 0.3 10*3/uL (ref 0.0–0.7)
Eosinophils Relative: 8 %
HEMATOCRIT: 40.7 % (ref 39.0–52.0)
HEMOGLOBIN: 13.7 g/dL (ref 13.0–17.0)
Lymphocytes Relative: 26 %
Lymphs Abs: 0.9 10*3/uL (ref 0.7–4.0)
MCH: 31.2 pg (ref 26.0–34.0)
MCHC: 33.7 g/dL (ref 30.0–36.0)
MCV: 92.7 fL (ref 78.0–100.0)
Monocytes Absolute: 0.4 10*3/uL (ref 0.1–1.0)
Monocytes Relative: 10 %
NEUTROS ABS: 2 10*3/uL (ref 1.7–7.7)
NEUTROS PCT: 55 %
Platelets: 174 10*3/uL (ref 150–400)
RBC: 4.39 MIL/uL (ref 4.22–5.81)
RDW: 13.5 % (ref 11.5–15.5)
WBC: 3.5 10*3/uL — ABNORMAL LOW (ref 4.0–10.5)

## 2017-09-28 LAB — URINALYSIS, ROUTINE W REFLEX MICROSCOPIC
BACTERIA UA: NONE SEEN
Bilirubin Urine: NEGATIVE
GLUCOSE, UA: NEGATIVE mg/dL
HGB URINE DIPSTICK: NEGATIVE
Ketones, ur: NEGATIVE mg/dL
LEUKOCYTES UA: NEGATIVE
Nitrite: NEGATIVE
Protein, ur: 100 mg/dL — AB
Specific Gravity, Urine: 1.006 (ref 1.005–1.030)
pH: 6 (ref 5.0–8.0)

## 2017-09-28 LAB — LIPASE, BLOOD: Lipase: 32 U/L (ref 11–51)

## 2017-09-28 MED ORDER — IOPAMIDOL (ISOVUE-300) INJECTION 61%
100.0000 mL | Freq: Once | INTRAVENOUS | Status: AC | PRN
  Administered 2017-09-28: 100 mL via INTRAVENOUS

## 2017-09-28 NOTE — ED Provider Notes (Signed)
[facility] EMERGENCY DEPARTMENT Provider Note   CSN: [id number] Arrival date & time: 09/28/17  2130     History   Chief Complaint Chief Complaint  Patient presents with  . Abdominal Pain    HPI Jackson Martin is a 77 y.o. male.  HPI Pt was seen at 0740.  Per pt, c/o gradual onset and persistence of constant generalized abd "pain" for the past 5 days.  Has been associated with constipation, last BM 5 days ago.  Describes the abd pain as "bloating." Pt has been taking colace and miralax without significant resulting BM. Pt also states he has a known hemorrhoid and continues to have intermittent rectal bleeding since dx last month. Pt has been f/u with his GI MD regularly for this complaint, as well as the ED. Denies N/V, no diarrhea, no fevers, no back pain, no rash, no CP/SOB, no black stools, no rectal pain, no injury, no dysuria/hematuria.     Past Medical History:  Diagnosis Date  . Anxiety   . Asthmatic bronchitis   . Atrial fibrillation ([place])   . Backache   . Colon polyps   . COPD (chronic obstructive pulmonary disease) ([place])   . Dyspnea   . Esophageal reflux   . Essential hypertension   . Gout   . Hemorrhoid   . Hyperlipidemia   . Insomnia   . Ischemic heart disease   . MI (myocardial infarction) ([place]) 2003  . Neck pain   . Plantar fasciitis   . Prostate cancer [facility])     Patient Active Problem List   Diagnosis Date Noted  . Blood in stool 09/23/2017  . Dyspnea on exertion 12/25/2016  . COPD  GOLD 0/ ? AB 12/25/2016  . Gastrointestinal hemorrhage 10/29/2016    Past Surgical History:  Procedure Laterality Date  . COLONOSCOPY    . COLONOSCOPY N/A 11/15/2016   Procedure: COLONOSCOPY;  Surgeon: Rogene Houston, MD;  Location[facility];  Service: Endoscopy;  Laterality: N/A;  1200  . POLYPECTOMY  11/15/2016   Procedure: POLYPECTOMY;  Surgeon: Rogene Houston, MD;  Location: [facility];  Service: Endoscopy;;  colon  . PROSTATECTOMY  2001         Home Medications    Prior to Admission medications   Medication Sig Start Date End Date Taking? Authorizing Provider  albuterol (PROVENTIL) (2.5 MG/3ML) 0.083% nebulizer solution Take 2.5 mg by nebulization every 6 (six) hours as needed for wheezing or shortness of breath.    [provider]  allopurinol (ZYLOPRIM) 300 MG tablet Take 300 mg by mouth daily.     [provider]  apixaban (ELIQUIS) 5 MG TABS tablet Take 1 tablet (5 mg total) by mouth 2 (two) times daily. 11/09/16   Satira Sark, MD  atorvastatin (LIPITOR) 20 MG tablet Take 20 mg by mouth at bedtime.     [provider]  budesonide-formoterol (SYMBICORT) 80-4.5 MCG/ACT inhaler Inhale 2 puffs into the lungs 2 (two) times daily. 12/25/16   Tanda Rockers, MD  CARTIA XT 240 MG 24 hr capsule TAKE 1 CAPSULE BY MOUTH ONCE DAILY 09/20/17   Herminio Commons, MD  citalopram (CELEXA) 20 MG tablet Take 20 mg by mouth daily with lunch.     [provider]  clonazePAM (KLONOPIN) 0.5 MG tablet Take 0.5 mg by mouth 2 (two) times daily as needed. For anxiety 10/19/16   [provider]  furosemide (LASIX) 20 MG tablet Take 20 mg by mouth  daily as needed (for fluid retention.).     [provider]  hydrocortisone (ANUSOL-HC) 25 MG suppository Place 1 suppository (25 mg total) rectally at bedtime. 09/12/17   Rehman, Mechele Dawley, MD  hydrocortisone (ANUSOL-HC) 25 MG suppository Place 1 suppository (25 mg total) rectally every 12 (twelve) hours. 09/24/17   Setzer, Rona Ravens, NP  Melatonin 10 MG TABS Take 1 tablet by mouth at bedtime.    [provider]  mirtazapine (REMERON) 15 MG tablet Take 15 mg by mouth at bedtime.    [provider]  montelukast (SINGULAIR) 10 MG tablet Take 10 mg by mouth at bedtime.    [provider]  Potassium Chloride (KLOR-CON 10 PO) Take 10 mEq by mouth daily as needed (FOR FLUID RETENTION/WITH LASIX).     [provider]   sertraline (ZOLOFT) 50 MG tablet Take 1 tablet by mouth daily. 02/20/17   [provider]    Family History Family History  Problem Relation Age of Onset  . Hypertension Mother   . Heart disease Mother   . Heart disease Maternal Aunt   . Heart disease Maternal Uncle   . Heart disease Maternal Grandmother   . Heart disease Maternal Grandfather   . Colon cancer Brother     Social History Social History   Tobacco Use  . Smoking status: Former Smoker    Packs/day: 0.75    Years: 3.00    Pack years: 2.25    Types: Cigarettes    Last attempt to quit: 03/06/1991    Years since quitting: 26.5  . Smokeless tobacco: Never Used  Substance Use Topics  . Alcohol use: No  . Drug use: No     Allergies   Patient has no known allergies.   Review of Systems Review of Systems ROS: Statement: All systems negative except as marked or noted in the HPI; Constitutional: Negative for fever and chills. ; ; Eyes: Negative for eye pain, redness and discharge. ; ; ENMT: Negative for ear pain, hoarseness, nasal congestion, sinus pressure and sore throat. ; ; Cardiovascular: Negative for chest pain, palpitations, diaphoresis, dyspnea and peripheral edema. ; ; Respiratory: Negative for cough, wheezing and stridor. ; ; Gastrointestinal: Negative for nausea, vomiting, diarrhea, hematemesis, jaundice and +abd bloating, constipation, rectal bleeding. . ; ; Genitourinary: Negative for dysuria, flank pain and hematuria. ; ; Genital:  No penile drainage or rash, no testicular pain or swelling, no scrotal rash or swelling. ;; Musculoskeletal: Negative for back pain and neck pain. Negative for swelling and trauma.; ; Skin: Negative for pruritus, rash, abrasions, blisters, bruising and skin lesion.; ; Neuro: Negative for headache, lightheadedness and neck stiffness. Negative for weakness, altered level of consciousness, altered mental status, extremity weakness, paresthesias, involuntary movement, seizure and  syncope.       Physical Exam Updated Vital Signs BP (!) 119/96 (BP Location: Right Arm)   Pulse 90   Temp 97.6 F (36.4 C) (Oral)   Resp 18   Ht 5\' 9"  (1.753 m)   Wt 76.7 kg (169 lb)   SpO2 97%   BMI 24.96 kg/m    BP (!) 127/96   Pulse 73   Temp 97.6 F (36.4 C) (Oral)   Resp 18   Ht 5\' 9"  (1.753 m)   Wt 76.7 kg (169 lb)   SpO2 97%   BMI 24.96 kg/m    Physical Exam 0745: Physical examination:  Nursing notes reviewed; Vital signs and O2 SAT reviewed;  Constitutional: Well developed,  Well nourished, Well hydrated, In no acute distress; Head:  Normocephalic, atraumatic; Eyes: EOMI, PERRL, No scleral icterus; ENMT: Mouth and pharynx normal, Mucous membranes moist; Neck: Supple, Full range of motion, No lymphadenopathy; Cardiovascular: Regular rate and rhythm, No gallop; Respiratory: Breath sounds clear & equal bilaterally, No wheezes.  Speaking full sentences with ease, Normal respiratory effort/excursion; Chest: Nontender, Movement normal; Abdomen: Soft, +very mild diffuse tenderness to palp. No rebound or guarding. Nondistended, Normal bowel sounds. Rectal exam performed w/permission of pt and ED RN chaperone present.  Anal tone normal.  No fissures, +external hemorrhoids without active bleeding. No palp masses.; Genitourinary: No CVA tenderness; Extremities: Peripheral pulses normal, No tenderness, No edema, No calf edema or asymmetry.; Neuro: AA&Ox3, Major CN grossly intact.  Speech clear. No gross focal motor or sensory deficits in extremities.; Skin: Color normal, Warm, Dry.; Psych:  Anxious.     ED Treatments / Results  Labs (all labs ordered are listed, but only abnormal results are displayed)   EKG None  Radiology   Procedures Procedures (including critical care time)  Medications Ordered in ED Medications - No data to display   Initial Impression / Assessment and Plan / ED Course  I have reviewed the triage vital signs and the nursing notes.  Pertinent  labs & imaging results that were available during my care of the patient were reviewed by me and considered in my medical decision making (see chart for details).  MDM Reviewed: previous chart, nursing note and vitals Reviewed previous: labs Interpretation: labs and CT scan   Results for orders placed or performed during the hospital encounter of 09/28/17  Comprehensive metabolic panel  Result Value Ref Range   Sodium 138 135 - 145 mmol/L   Potassium 3.6 3.5 - 5.1 mmol/L   Chloride 104 98 - 111 mmol/L   CO2 27 22 - 32 mmol/L   Glucose, Bld 98 70 - 99 mg/dL   BUN 7 (L) 8 - 23 mg/dL   Creatinine, Ser 1.12 0.61 - 1.24 mg/dL   Calcium 8.8 (L) 8.9 - 10.3 mg/dL   Total Protein 6.4 (L) 6.5 - 8.1 g/dL   Albumin 3.6 3.5 - 5.0 g/dL   AST 20 15 - 41 U/L   ALT 11 0 - 44 U/L   Alkaline Phosphatase 109 38 - 126 U/L   Total Bilirubin 1.2 0.3 - 1.2 mg/dL   GFR calc non Af Amer >60 >60 mL/min   GFR calc Af Amer >60 >60 mL/min   Anion gap 7 5 - 15  Lipase, blood  Result Value Ref Range   Lipase 32 11 - 51 U/L  CBC with Differential  Result Value Ref Range   WBC 3.5 (L) 4.0 - 10.5 K/uL   RBC 4.39 4.22 - 5.81 MIL/uL   Hemoglobin 13.7 13.0 - 17.0 g/dL   HCT 40.7 39.0 - 52.0 %   MCV 92.7 78.0 - 100.0 fL   MCH 31.2 26.0 - 34.0 pg   MCHC 33.7 30.0 - 36.0 g/dL   RDW 13.5 11.5 - 15.5 %   Platelets 174 150 - 400 K/uL   Neutrophils Relative % 55 %   Neutro Abs 2.0 1.7 - 7.7 K/uL   Lymphocytes Relative 26 %   Lymphs Abs 0.9 0.7 - 4.0 K/uL   Monocytes Relative 10 %   Monocytes Absolute 0.4 0.1 - 1.0 K/uL   Eosinophils Relative 8 %   Eosinophils Absolute 0.3 0.0 - 0.7 K/uL   Basophils Relative 1 %  Basophils Absolute 0.1 0.0 - 0.1 K/uL  Urinalysis, Routine w reflex microscopic  Result Value Ref Range   Color, Urine STRAW (A) YELLOW   APPearance CLEAR CLEAR   Specific Gravity, Urine 1.006 1.005 - 1.030   pH 6.0 5.0 - 8.0   Glucose, UA NEGATIVE NEGATIVE mg/dL   Hgb urine dipstick NEGATIVE  NEGATIVE   Bilirubin Urine NEGATIVE NEGATIVE   Ketones, ur NEGATIVE NEGATIVE mg/dL   Protein, ur 100 (A) NEGATIVE mg/dL   Nitrite NEGATIVE NEGATIVE   Leukocytes, UA NEGATIVE NEGATIVE   WBC, UA 0-5 0 - 5 WBC/hpf   Bacteria, UA NONE SEEN NONE SEEN   Ct Abdomen Pelvis W Contrast Result Date: 09/28/2017 CLINICAL DATA:  Lower abdominal pain EXAM: CT ABDOMEN AND PELVIS WITH CONTRAST TECHNIQUE: Multidetector CT imaging of the abdomen and pelvis was performed using the standard protocol following bolus administration of intravenous contrast. CONTRAST:  15mL ISOVUE-300 IOPAMIDOL (ISOVUE-300) INJECTION 61% COMPARISON:  None. FINDINGS: Lower chest: Lung bases are free of acute infiltrate or sizable effusion. A few tiny nodules are seen. A slightly larger ground-glass attenuation nodule is noted measuring approximately 13 mm in the left femur lobe best seen on image number 8 of series 5. Hepatobiliary: No focal liver abnormality is seen. No gallstones, gallbladder wall thickening, or biliary dilatation. Pancreas: Unremarkable. No pancreatic ductal dilatation or surrounding inflammatory changes. Spleen: Normal in size without focal abnormality. Adrenals/Urinary Tract: Adrenal glands are within normal limits. Kidneys are well visualized bilaterally without renal calculi or urinary tract obstructive changes. The bladder is partially distended. Stomach/Bowel: Scattered diverticular change is noted without evidence of diverticulitis. No obstructive changes are seen. Appendix is within normal limits. No obstructive changes are seen. Vascular/Lymphatic: Aortic atherosclerosis. No enlarged abdominal or pelvic lymph nodes. Reproductive: Prostate has been surgically removed. Other: No abdominal wall hernia or abnormality. No abdominopelvic ascites. Musculoskeletal: Degenerative changes of lumbar spine are noted. IMPRESSION: Diverticulosis without evidence of diverticulitis. Nodular changes are identified with a prominent  ground-glass nodule in the left lower lobe. Initial follow-up with CT at 6-12 months is recommended to confirm persistence. If persistent, repeat CT is recommended every 2 years until 5 years of stability has been established. This recommendation follows the consensus statement: Guidelines for Management of Incidental Pulmonary Nodules Detected on CT Images: From the Fleischner Society 2017; Radiology 2017; 284:228-243. Electronically Signed   By: Inez Catalina M.D.   On: 09/28/2017 10:22     0750:  Pt has significant anxiety, has been evaluated in the ED x2 and by GI MD x2 regarding rectal bleeding from known hemorrhoids over the past 1 month. Last ED visit notes pt had significant anxiety over his brother having hx GI cancer. Attempted to reassure pt today regarding past reassuring colonoscopies, but pt insistent and anxious. Expectations for ED visit set. Will obtain labs and CT A/P.   1230:  H/H per baseline. Workup reassuring. Pt continues anxious, perseverating on his brother that "had drip drip drip of rectal bleeding and then bled out from a colon cancer!"  Attempts again by ED RN and I to reassure pt regarding his known source for rectal bleeding (hemorrhoid) and previous reassuring colonoscopy and CT scan today. Multiple questions asked/answered, pt verb understanding. Pt states he feels ready to go home now. Dx and testing d/w pt.  Questions answered.  Verb understanding, agreeable to d/c home with outpt f/u.    Final Clinical Impressions(s) / ED Diagnoses   Final diagnoses:  None    ED  Discharge Orders    None       Francine Graven, DO 10/02/17 1236

## 2017-09-28 NOTE — ED Triage Notes (Signed)
Patient c/o lower abd pain and abd destintion. Per patient no BM x5 days. Denies any nausea or vomiting. Per patient large amount of bright red blood noted when he attempts to have BM. Denies straining with BM. Patient taking colace and Mirilax with no improvement. Patient reports being seen by DR Laural Golden and diagnosed with hemorrhoids, given presricption for hydrocortisone suppository but unable to afford.

## 2017-09-28 NOTE — Discharge Instructions (Addendum)
Your CT scan showed an incidental finding(s):  "Nodular changes are identified with a prominent ground-glass nodule in the left lower lobe. Initial follow-up with CT at 6-12 months is recommended to confirm persistence. If persistent, repeat CT is recommended every 2 years until 5 years of stability has been established. This recommendation follows the consensus statement: Guidelines for Management of Incidental Pulmonary Nodules Detected on CT Images: From the Fleischner Society 2017; Radiology 2017; 284:228-243."  Continue to take your usual prescriptions as previously directed.  Begin to take over the counter fiber products (ie: Citucel, Metamucil) or stool softener (colace), as directed on packaging, for the next month.  Use over the counter hemorrhoidal relief products (ie:  preparation H, anusol), as directed on packaging, as needed for symptoms. Sit in a warm water tub or sitz bath several times per day for the next week.  Call your regular medical doctor on Monday to schedule a follow up appointment within the next 3 days. Call your GI doctor on Monday to schedule a follow up appointment within the next week.   Return to the Emergency Department immediately if worsening.

## 2017-10-02 ENCOUNTER — Ambulatory Visit (HOSPITAL_COMMUNITY): Admit: 2017-10-02 | Payer: Medicare Other | Admitting: Internal Medicine

## 2017-10-02 ENCOUNTER — Telehealth (INDEPENDENT_AMBULATORY_CARE_PROVIDER_SITE_OTHER): Payer: Self-pay | Admitting: Internal Medicine

## 2017-10-02 ENCOUNTER — Encounter (HOSPITAL_COMMUNITY): Payer: Self-pay

## 2017-10-02 DIAGNOSIS — K640 First degree hemorrhoids: Secondary | ICD-10-CM

## 2017-10-02 SURGERY — COLONOSCOPY
Anesthesia: Moderate Sedation

## 2017-10-02 NOTE — Telephone Encounter (Signed)
I have spoken with patient. He continues to have some bleeding from his hemorrhoids. Referral to Dr. Arnoldo Morale

## 2017-10-02 NOTE — Telephone Encounter (Signed)
Spoke with pt.  Told pt Eliquis is not the cause of his bleeding hemorrhoid but could be making it worse.  Advised him not to stop Eliquis until he talks with Dr Laural Golden since it could increase his risk of blood clot or stroke.  Pt will call me back after talks with Dr Laural Golden.

## 2017-10-02 NOTE — Telephone Encounter (Signed)
Received a call from patient stating that he is having a lot of rectal bleeding. He wants to know if he needs to stop the Eliquis.

## 2017-10-02 NOTE — Telephone Encounter (Signed)
I have placed order referral to Dr. Arnoldo Morale

## 2017-10-02 NOTE — Telephone Encounter (Signed)
Jackson Martin - may I ask that you please review this and advise the patient with your recommendation.

## 2017-10-02 NOTE — Telephone Encounter (Signed)
Please have Dr Laural Golden call this patient - he has been seen by Karna Christmas - patient stated she is saying the bleeding is from a hemorrhoid - he would like to discuss this or be referred to someone else

## 2017-10-03 ENCOUNTER — Ambulatory Visit (INDEPENDENT_AMBULATORY_CARE_PROVIDER_SITE_OTHER): Payer: Medicare Other | Admitting: Internal Medicine

## 2017-10-07 ENCOUNTER — Encounter (INDEPENDENT_AMBULATORY_CARE_PROVIDER_SITE_OTHER): Payer: Self-pay | Admitting: Internal Medicine

## 2017-10-07 ENCOUNTER — Ambulatory Visit (INDEPENDENT_AMBULATORY_CARE_PROVIDER_SITE_OTHER): Payer: Medicare Other | Admitting: Internal Medicine

## 2017-10-07 VITALS — BP 118/72 | HR 62 | Temp 97.9°F | Resp 18 | Ht 69.0 in | Wt 168.7 lb

## 2017-10-07 DIAGNOSIS — K5900 Constipation, unspecified: Secondary | ICD-10-CM | POA: Diagnosis not present

## 2017-10-07 DIAGNOSIS — K921 Melena: Secondary | ICD-10-CM | POA: Diagnosis not present

## 2017-10-07 MED ORDER — BENEFIBER DRINK MIX PO PACK: 4.0000 g | PACK | Freq: Every day | ORAL | Status: DC

## 2017-10-07 MED ORDER — HYDROCORTISONE 2.5 % RE CREA
1.0000 | TOPICAL_CREAM | Freq: Two times a day (BID) | RECTAL | 1 refills | Status: DC
Start: 2017-10-07 — End: 2019-09-02

## 2017-10-07 NOTE — Patient Instructions (Signed)
Increase intake of fiber rich foods as discussed. Keep symptom diary as to frequency of bleeding and consistency of stools. Progress report in 1 week.

## 2017-10-07 NOTE — Progress Notes (Signed)
Presenting complaint;  Rectal bleeding.  Database and subjective:   Patient is 77 year old Caucasian male who is here for scheduled visit. He has history of multiple colonic adenomas.  He had 2 colonoscopies in 2013 and one colonoscopy in March 2017 by Dr. Doristine Mango of [facility] with removal of multiple adenomas.  Since Dr. Britta Mccreedy has relocated he was referred to our office and underwent colonoscopy in September last year with removal of multiple/9 small adenomas.  He was also noted to have sigmoid diverticulosis and external hemorrhoids. He was seen in our office by Ms. Louis Matte, NP on 09/12/2017 and felt to be bleeding from hemorrhoids and he was prescribed Proctofoam.  He return for follow-up visit on 09/24/2017 and reported no improvement.  This time he was given prescription for Anusol [facility] Suppository which he used for 2 weeks but without any benefit. Patient states he has been passing blood with her bowel movements for about a month now.  He passes blood every day.  It usually is small in amount but sometimes more than that.  At times he has little squirt.  He remains very concerned because his brother had rectal carcinoma.  However his brother had this diagnosis on first colonoscopy.  He also reports intermittent bleeding and he has to use paper towel and has to change his shorts multiple times in a day.  He remains with constipation.  He is taking stool softener every day and using MiraLAX and/or Dulcolax on as-needed basis.  He he is having daily bowel movement but always passing his basin pieces of stool.  He is aware that his diet is very poor.  He does not have a good appetite.  However he has not lost any weight.  He denies abdominal pain nausea or vomiting.  He does not do any physical activity other than sporadic walking block or 2.  He lives alone. He is on Eliquis.   Current Medications: Outpatient Encounter Medications as of 10/07/2017  Medication Sig  . albuterol  (PROVENTIL) (2.5 MG/3ML) 0.083% nebulizer solution Take 2.5 mg by nebulization every 6 (six) hours as needed for wheezing or shortness of breath.  . allopurinol (ZYLOPRIM) 300 MG tablet Take 300 mg by mouth daily.   Marland Kitchen apixaban (ELIQUIS) 5 MG TABS tablet Take 1 tablet (5 mg total) by mouth 2 (two) times daily.  Marland Kitchen atorvastatin (LIPITOR) 20 MG tablet Take 20 mg by mouth at bedtime.   . bisacodyl (DULCOLAX) 5 MG EC tablet Take 5 mg by mouth as needed for moderate constipation.  . budesonide-formoterol (SYMBICORT) 80-4.5 MCG/ACT inhaler Inhale 2 puffs into the lungs 2 (two) times daily.  Marland Kitchen CARTIA XT 240 MG 24 hr capsule TAKE 1 CAPSULE BY MOUTH ONCE DAILY  . citalopram (CELEXA) 20 MG tablet Take 20 mg by mouth daily with lunch.   . clonazePAM (KLONOPIN) 0.5 MG tablet Take 0.5 mg by mouth 2 (two) times daily as needed. For anxiety  . Docusate Sodium (COLACE PO) Take by mouth 2 (two) times daily.  . furosemide (LASIX) 20 MG tablet Take 20 mg by mouth daily as needed (for fluid retention.).   Marland Kitchen hydrocortisone (ANUSOL-HC) 25 MG suppository Place 1 suppository (25 mg total) rectally every 12 (twelve) hours.  . Melatonin 10 MG TABS Take 1 tablet by mouth at bedtime.  . mirtazapine (REMERON) 15 MG tablet Take 15 mg by mouth at bedtime.  . montelukast (SINGULAIR) 10 MG tablet Take 10 mg by mouth at bedtime.  . polyethylene  glycol (MIRALAX / GLYCOLAX) packet Take 17 g by mouth as needed for moderate constipation.  . Potassium Chloride (KLOR-CON 10 PO) Take 10 mEq by mouth daily as needed (FOR FLUID RETENTION/WITH LASIX).   Marland Kitchen sertraline (ZOLOFT) 50 MG tablet Take 1 tablet by mouth daily.   No facility-administered encounter medications on file as of 10/07/2017.      Objective: Blood pressure 118/72, pulse 62, temperature 97.9 F (36.6 C), temperature source Oral, resp. rate 18, height 5\' 9"  (1.753 m), weight 168 lb 11.2 oz (76.5 kg). Patient is alert and in no acute distress. Conjunctiva is pink. Sclera is  nonicteric Oropharyngeal mucosa is normal. He has upper dental plate and his own teeth and lower jaw but some are missing. No neck masses or thyromegaly noted. Cardiac exam with irregular rhythm normal S1 and S2. No murmur or gallop noted. Lungs are clear to auscultation. Abdomenis symmetrical soft and nontender with organomegaly or masses. Rectal examination reveals soft tach/hemorrhoids 1 of which appears to be with redness but no ulceration noted.  Digital exam reveals soft stool in the rectum and stool is guaiac negative. No LE edema or clubbing noted.  Labs/studies Results:  Lab data from 09/28/2017 WBC 3.5, H&H 13.7 and 40.7 and platelet count 174K. MCV is 92.7.  Assessment:  #1.  Recurrent hematochezia well documented to be due to hemorrhoids.  Situation is complicated by the fact that he is on an anticoagulant.  If he is able to prevent constipation bleeding may stop otherwise he may need further intervention. His H&H is normal which is reassuring.  #2.  Constipation.  Patient needs to be taking MiraLAX on schedule rather than on PRN basis and he also needs to increase consumption of fiber rich foods.   Plan:  Patient reassured. Patient will continue Colace 200 mg p.o. Daily. He will take polyethylene glycol 8.5 g p.o. daily.  He can titrate the dose. He will go back to using Proctofoam HC cream to be applied externally twice daily until prescription runs out. He will keep symptom diary and call office with progress report in 2 weeks.   If bleeding persists.  Will consider referral to colorectal surgeon. Next colonoscopy would be in September 2021.

## 2017-10-10 ENCOUNTER — Ambulatory Visit (INDEPENDENT_AMBULATORY_CARE_PROVIDER_SITE_OTHER): Payer: Medicare Other | Admitting: *Deleted

## 2017-10-10 DIAGNOSIS — I4891 Unspecified atrial fibrillation: Secondary | ICD-10-CM | POA: Diagnosis not present

## 2017-10-10 DIAGNOSIS — Z5181 Encounter for therapeutic drug level monitoring: Secondary | ICD-10-CM

## 2017-10-10 NOTE — Progress Notes (Signed)
Pt was started on Eliquis5mg  twice dailyfor atrial fibon 12/06/16.   Reviewed patients medication list. Pt is notcurrently on any combined P-gp and strong CYP3A4 inhibitors/inducers (ketoconazole, traconazole, ritonavir, carbamazepine, phenytoin, rifampin, St. John's wort). Reviewed labs from 09/28/17 @ [facility] lab. SCr 1.12, VIFBPP94.3EX, CrCl57.87  Dose is appropriate based on age, weight, and SCr. Hgb and HCT: 13.7/40.7   Pt denies and problems taking Eliquis since last visit. Has been having some rectal bleeding followed by Dr Laural Golden.  Felt to be due to constipation and hemorrhoids.  Dr Laural Golden is treating and symptoms have improved.   A full discussion of the nature of anticoagulants has been carried out. A benefit/risk analysis has been presented to the patient, so that they understand the justification for choosing anticoagulation with Eliquis at this time. The need for compliance is stressed. Pt is aware to take the medication twice daily. Side effects of potential bleeding are discussed, including unusual colored urine or stools, coughing up blood or coffee ground emesis, nose bleeds or serious fall or head trauma. Discussed signs and symptoms of stroke. The patient should avoid any OTC items containing aspirin or ibuprofen. Avoid alcohol consumption. Call if any signs of abnormal bleeding. Discussed financial obligations and resolved any difficulty in obtaining medication. Next lab test in Jan. 2020 at time of Dr Domenic Polite appt.    Placed in recall for 03/2018.

## 2017-10-16 ENCOUNTER — Telehealth (INDEPENDENT_AMBULATORY_CARE_PROVIDER_SITE_OTHER): Payer: Self-pay | Admitting: Internal Medicine

## 2017-10-16 NOTE — Telephone Encounter (Signed)
Patient called stated he was seen by NUR on 10-07-17 - he has changed his diet and doing what he was told but is still bleeding - patient would like to speak with NUR

## 2017-10-17 NOTE — Telephone Encounter (Signed)
appt with Dr Marcello Moores 10/21/17 at 230, patient aware

## 2017-10-17 NOTE — Telephone Encounter (Signed)
Talked with the patient and he states that he has sone exactly what Dr.Rehman ask him to do, the bleeding let up some a little last week and he had small bowel movements. Yesterday he awoke between 4 -4:30 am to go to the bathroom and it was just a stream of bright red blood.  Patient advised that we would talk with Dr.Rehman and call him back with the recommendation.

## 2017-10-17 NOTE — Telephone Encounter (Signed)
Talked with Dr.Rehman and he ask that the patient be sent to Dr.Thomas , colorectal surgeon for evaluation. Rectal bleeding. Patient called and made aware that a referral was being made.

## 2017-10-29 DIAGNOSIS — K642 Third degree hemorrhoids: Secondary | ICD-10-CM | POA: Diagnosis not present

## 2017-11-25 DIAGNOSIS — Z23 Encounter for immunization: Secondary | ICD-10-CM | POA: Diagnosis not present

## 2017-11-28 DIAGNOSIS — I10 Essential (primary) hypertension: Secondary | ICD-10-CM | POA: Diagnosis not present

## 2017-11-28 DIAGNOSIS — F419 Anxiety disorder, unspecified: Secondary | ICD-10-CM | POA: Diagnosis not present

## 2017-11-28 DIAGNOSIS — J449 Chronic obstructive pulmonary disease, unspecified: Secondary | ICD-10-CM | POA: Diagnosis not present

## 2017-11-28 DIAGNOSIS — I4891 Unspecified atrial fibrillation: Secondary | ICD-10-CM | POA: Diagnosis not present

## 2017-11-28 DIAGNOSIS — Z6824 Body mass index (BMI) 24.0-24.9, adult: Secondary | ICD-10-CM | POA: Diagnosis not present

## 2017-11-28 DIAGNOSIS — Z299 Encounter for prophylactic measures, unspecified: Secondary | ICD-10-CM | POA: Diagnosis not present

## 2017-11-29 DIAGNOSIS — K642 Third degree hemorrhoids: Secondary | ICD-10-CM | POA: Diagnosis not present

## 2017-12-16 DIAGNOSIS — Z23 Encounter for immunization: Secondary | ICD-10-CM | POA: Diagnosis not present

## 2018-01-07 ENCOUNTER — Telehealth: Payer: Self-pay | Admitting: *Deleted

## 2018-01-07 ENCOUNTER — Telehealth: Payer: Self-pay | Admitting: Cardiology

## 2018-01-07 NOTE — Telephone Encounter (Signed)
Patient called asking for Alma Friendly to return his call

## 2018-01-07 NOTE — Telephone Encounter (Signed)
Patient informed to bring documents showing proof of income, and out of pocket expenses paid for the year in order to renew his eliquis patient assistance application. Verbalized understanding.

## 2018-01-07 NOTE — Telephone Encounter (Signed)
Patient notified nurse to shred any bank statements that were needed for eliquis patient assistance. Advised patient that this had already been done.

## 2018-01-28 DIAGNOSIS — Z6825 Body mass index (BMI) 25.0-25.9, adult: Secondary | ICD-10-CM | POA: Diagnosis not present

## 2018-01-28 DIAGNOSIS — J449 Chronic obstructive pulmonary disease, unspecified: Secondary | ICD-10-CM | POA: Diagnosis not present

## 2018-01-28 DIAGNOSIS — I4891 Unspecified atrial fibrillation: Secondary | ICD-10-CM | POA: Diagnosis not present

## 2018-01-28 DIAGNOSIS — I10 Essential (primary) hypertension: Secondary | ICD-10-CM | POA: Diagnosis not present

## 2018-01-28 DIAGNOSIS — F329 Major depressive disorder, single episode, unspecified: Secondary | ICD-10-CM | POA: Diagnosis not present

## 2018-01-28 DIAGNOSIS — Z299 Encounter for prophylactic measures, unspecified: Secondary | ICD-10-CM | POA: Diagnosis not present

## 2018-02-05 ENCOUNTER — Telehealth: Payer: Self-pay | Admitting: Cardiology

## 2018-02-05 NOTE — Telephone Encounter (Signed)
Left detailed message that we have not recently tried to call him and no outstanding phone notes

## 2018-02-05 NOTE — Telephone Encounter (Signed)
Returning Lydia's call - he is not sure if message is old or not

## 2018-02-10 ENCOUNTER — Telehealth: Payer: Self-pay | Admitting: Cardiology

## 2018-02-10 NOTE — Telephone Encounter (Signed)
Patient called checking on status of his application assistance for Eliquis

## 2018-02-11 NOTE — Telephone Encounter (Signed)
Patient informed that [employer] said they did not receive the application that was sent on 01/14/18 and that his application was re-faxed this morning.

## 2018-02-18 ENCOUNTER — Telehealth: Payer: Self-pay | Admitting: Cardiology

## 2018-02-18 NOTE — Telephone Encounter (Signed)
Patient called stating that he is trying to reach someone about the Eliquis application.

## 2018-02-18 NOTE — Telephone Encounter (Signed)
Spoke with patient and he said he called [employer] patient assistance for eliquis and was informed that he was approved and they would be sending him eliquis.

## 2018-03-04 ENCOUNTER — Encounter

## 2018-03-11 ENCOUNTER — Ambulatory Visit: Payer: Medicare Other | Admitting: Cardiology

## 2018-03-12 ENCOUNTER — Telehealth: Payer: Self-pay | Admitting: Cardiology

## 2018-03-12 NOTE — Telephone Encounter (Signed)
Patient called back disputing being declined for assistance with medication.  He has [employer] complete - they told him he had to pay $400 first time then after that $47  He said that he does not have the money to pay this.

## 2018-03-12 NOTE — Telephone Encounter (Addendum)
Patient was given the number to call to apply for the low income subsidy program through social security [phone] to see if he qualify for assistance with his co-pays. Patient will call office back with their response.  Patient also said that [employer] will send him his last refill for the month of January for eliquis.

## 2018-03-17 ENCOUNTER — Other Ambulatory Visit: Payer: Self-pay | Admitting: Cardiology

## 2018-03-17 MED ORDER — APIXABAN 5 MG PO TABS: 5.0000 mg | ORAL_TABLET | Freq: Two times a day (BID) | ORAL | 1 refills | Status: DC

## 2018-03-17 NOTE — Telephone Encounter (Signed)
°*  STAT* If patient is at the pharmacy, call can be transferred to refill team.   1. Which medications need to be refilled?   apixaban (ELIQUIS) 5 MG TABS tablet    2. Which pharmacy/location (including street and city if local pharmacy) is medication to be sent to?  [place]  3. Do they need a 30 day or 90 day supply? 60       Please call patient before doing so.  He is confused about what he is suppose to do about getting filled.  He does not understand process of submitting to his insurance to get discount after he pays for first refill

## 2018-04-02 ENCOUNTER — Encounter: Payer: Self-pay | Admitting: Cardiology

## 2018-04-02 NOTE — Progress Notes (Signed)
Cardiology Office Note  Date: 04/03/2018   ID: Jackson Martin, DOB October 13, 1940, MRN [id number]  PCP: Monico Blitz, MD  Primary Cardiologist: Rozann Lesches, MD   Chief Complaint  Patient presents with  . Atrial Fibrillation    History of Present Illness: Jackson Martin is a 78 y.o. male last seen in July 2019.  He is here for a routine follow-up visit.  He does not report any palpitations or chest pain.  Describes NYHA class II dyspnea.  I reviewed his medications which are outlined below.  He does not report any bleeding problems or stool changes on Eliquis.  He is due for follow-up CBC and BMET.  I personally reviewed his ECG today which shows atrial fibrillation with right bundle branch block, left anterior fascicular block, PVC.  Heart rate control is adequate on cardia XT.  Past Medical History:  Diagnosis Date  . Anxiety   . Asthmatic bronchitis   . Atrial fibrillation ([place])   . Backache   . Colon polyps   . COPD (chronic obstructive pulmonary disease) ([place])   . Esophageal reflux   . Essential hypertension   . Gout   . Hemorrhoid   . Hyperlipidemia   . Insomnia   . Ischemic heart disease   . MI (myocardial infarction) ([place]) 2003  . Neck pain   . Plantar fasciitis   . Prostate cancer [facility])     Past Surgical History:  Procedure Laterality Date  . COLONOSCOPY    . COLONOSCOPY N/A 11/15/2016   Procedure: COLONOSCOPY;  Surgeon: Rogene Houston, MD;  Location: [facility];  Service: Endoscopy;  Laterality: N/A;  1200  . POLYPECTOMY  11/15/2016   Procedure: POLYPECTOMY;  Surgeon: Rogene Houston, MD;  Location: [facility];  Service: Endoscopy;;  colon  . PROSTATECTOMY  2001    Current Outpatient Medications  Medication Sig Dispense Refill  . albuterol (PROVENTIL) (2.5 MG/3ML) 0.083% nebulizer solution Take 2.5 mg by nebulization every 6 (six) hours as needed for wheezing or shortness of breath.    . allopurinol (ZYLOPRIM) 300 MG tablet Take 300 mg by  mouth daily.     Marland Kitchen apixaban (ELIQUIS) 5 MG TABS tablet Take 1 tablet (5 mg total) by mouth 2 (two) times daily. 60 tablet 1  . atorvastatin (LIPITOR) 20 MG tablet Take 20 mg by mouth at bedtime.     . bisacodyl (DULCOLAX) 5 MG EC tablet Take 5 mg by mouth as needed for moderate constipation.    . budesonide-formoterol (SYMBICORT) 80-4.5 MCG/ACT inhaler Inhale 2 puffs into the lungs 2 (two) times daily. 1 Inhaler 11  . CARTIA XT 240 MG 24 hr capsule TAKE 1 CAPSULE BY MOUTH ONCE DAILY 90 capsule 1  . citalopram (CELEXA) 20 MG tablet Take 20 mg by mouth daily with lunch.     . clonazePAM (KLONOPIN) 0.5 MG tablet Take 0.5 mg by mouth 2 (two) times daily as needed. For anxiety    . Docusate Sodium (COLACE PO) Take by mouth 2 (two) times daily.    . furosemide (LASIX) 20 MG tablet Take 20 mg by mouth daily as needed (for fluid retention.).     Marland Kitchen hydrocortisone (ANUSOL-HC) 2.5 % rectal cream Place 1 application rectally 2 (two) times daily. 30 g 1  . Melatonin 10 MG TABS Take 1 tablet by mouth at bedtime.    . mirtazapine (REMERON) 15 MG tablet Take 15 mg by mouth at bedtime.    . montelukast (SINGULAIR)  10 MG tablet Take 10 mg by mouth at bedtime.    . polyethylene glycol (MIRALAX / GLYCOLAX) packet Take 17 g by mouth as needed for moderate constipation.    . Potassium Chloride (KLOR-CON 10 PO) Take 10 mEq by mouth daily as needed (FOR FLUID RETENTION/WITH LASIX).     Marland Kitchen sertraline (ZOLOFT) 50 MG tablet Take 1 tablet by mouth daily.    . Wheat Dextrin (BENEFIBER DRINK MIX) PACK Take 4 g by mouth at bedtime.     No current facility-administered medications for this visit.    Allergies:  Patient has no known allergies.   Social History: The patient  reports that he quit smoking about 27 years ago. His smoking use included cigarettes. He has a 2.25 pack-year smoking history. He has never used smokeless tobacco. He reports that he does not drink alcohol or use drugs.   ROS:  Please see the history of  present illness. Otherwise, complete review of systems is positive for none.  All other systems are reviewed and negative.   Physical Exam: VS:  BP 124/72   Pulse 83   Ht 5\' 9"  (1.753 m)   Wt 171 lb (77.6 kg)   SpO2 97%   BMI 25.25 kg/m , BMI Body mass index is 25.25 kg/m.  Wt Readings from Last 3 Encounters:  04/03/18 171 lb (77.6 kg)  10/07/17 168 lb 11.2 oz (76.5 kg)  09/28/17 169 lb (76.7 kg)    General: Patient appears comfortable at rest. HEENT: Conjunctiva and lids normal, oropharynx clear. Neck: Supple, no elevated JVP or carotid bruits, no thyromegaly. Lungs: Clear to auscultation, nonlabored breathing at rest. Cardiac: Irregularly irregular, no S3, soft systolic murmur. Abdomen: Soft, nontender, bowel sounds present. Extremities: No pitting edema, distal pulses 2+. Skin: Warm and dry. Musculoskeletal: No kyphosis. Neuropsychiatric: Alert and oriented x3, affect grossly appropriate.  ECG: I personally reviewed the tracing from 01/04/2017 which showed atrial fibrillation with right bundle branch block and nonspecific ST changes.  Recent Labwork: 09/28/2017: ALT 11; AST 20; BUN 7; Creatinine, Ser 1.12; Hemoglobin 13.7; Platelets 174; Potassium 3.6; Sodium 138   Other Studies Reviewed Today:  Echocardiogram 08/06/2016 [facility]): Normal LV wall thickness with LVEF 60-65%, normal right ventricular contraction, mild left atrial enlargement, mildly sclerotic aortic valve without stenosis, mild mitral regurgitation, mild tricuspid regurgitation, no pericardial effusion.  Assessment and Plan:  1.  Permanent atrial fibrillation.  CHADSVASC score is 4.  Plan to continue heart rate control strategy.  He remains on Eliquis without reported bleeding problems.  Follow-up CBC and BMET.  2.  Essential hypertension, blood pressure is well controlled today.  Current medicines were reviewed with the patient today.   Orders Placed This Encounter  Procedures  . EKG  12-Lead    Disposition: Follow-up in 6 months.  Signed, Satira Sark, MD, [facility] 04/03/2018 3:00 PM    [facility] [address], [place], [place] [address] Phone: [phone]; Fax: [phone]

## 2018-04-03 ENCOUNTER — Ambulatory Visit (INDEPENDENT_AMBULATORY_CARE_PROVIDER_SITE_OTHER): Payer: Medicare PPO | Admitting: Pharmacist

## 2018-04-03 ENCOUNTER — Encounter: Payer: Self-pay | Admitting: Cardiology

## 2018-04-03 ENCOUNTER — Ambulatory Visit: Payer: Medicare Other | Admitting: Cardiology

## 2018-04-03 VITALS — BP 124/72 | HR 83 | Ht 69.0 in | Wt 171.0 lb

## 2018-04-03 DIAGNOSIS — Z79899 Other long term (current) drug therapy: Secondary | ICD-10-CM

## 2018-04-03 DIAGNOSIS — I4819 Other persistent atrial fibrillation: Secondary | ICD-10-CM

## 2018-04-03 DIAGNOSIS — K921 Melena: Secondary | ICD-10-CM

## 2018-04-03 DIAGNOSIS — I10 Essential (primary) hypertension: Secondary | ICD-10-CM | POA: Diagnosis not present

## 2018-04-03 DIAGNOSIS — Z5181 Encounter for therapeutic drug level monitoring: Secondary | ICD-10-CM

## 2018-04-03 DIAGNOSIS — I4891 Unspecified atrial fibrillation: Secondary | ICD-10-CM

## 2018-04-03 NOTE — Patient Instructions (Addendum)
Medication Instructions:   Your physician recommends that you continue on your current medications as directed. Please refer to the Current Medication list given to you today.  Labwork:  Your physician recommends that you return for lab work in: to check your BMET & CBC.  Testing/Procedures:  NONE  Follow-Up:  Your physician recommends that you schedule a follow-up appointment in: 6 months. You will receive a reminder letter in the mail in about 4 months reminding you to call and schedule your appointment. If you don't receive this letter, please contact our office.  Any Other Special Instructions Will Be Listed Below (If Applicable).  If you need a refill on your cardiac medications before your next appointment, please call your pharmacy. 

## 2018-04-03 NOTE — Progress Notes (Signed)
Pt was started on Eliquis 5mg  BID for Afib on 12/06/16.    Reviewed patients medication list.  Pt is not currently on any combined P-gp and strong CYP3A4 inhibitors/inducers (ketoconazole, traconazole, ritonavir, carbamazepine, phenytoin, rifampin, St. John's wort).  Reviewed labs.  SCr 0.95, Weight 76.5kg, CrCl- 72ml/min.  Dose appropriate based on age, weight, and SCr.  Hgb and HCT Within Normal Limits  A full discussion of the nature of anticoagulants has been carried out.  A benefit/risk analysis has been presented to the patient, so that they understand the justification for choosing anticoagulation with Eliquis at this time.  The need for compliance is stressed.  Pt is aware to take the medication twice daily.  Side effects of potential bleeding are discussed, including unusual colored urine or stools, coughing up blood or coffee ground emesis, nose bleeds or serious fall or head trauma.  Discussed signs and symptoms of stroke. The patient should avoid any OTC items containing aspirin or ibuprofen.  Avoid alcohol consumption.   Call if any signs of abnormal bleeding.  Discussed financial obligations and resolved any difficulty in obtaining medication.    Pt aware of results and to continue on Eliquis 5mg  BID.

## 2018-04-03 NOTE — Patient Instructions (Signed)
A full discussion of the nature of anticoagulants has been carried out.  A benefit/risk analysis has been presented to the patient, so that they understand the justification for choosing anticoagulation with Eliquis at this time.  The need for compliance is stressed.  Pt is aware to take the medication twice daily.  Side effects of potential bleeding are discussed, including unusual colored urine or stools, coughing up blood or coffee ground emesis, nose bleeds or serious fall or head trauma.  Discussed signs and symptoms of stroke. The patient should avoid any OTC items containing aspirin or ibuprofen.  Avoid alcohol consumption.   Call if any signs of abnormal bleeding.  Discussed financial obligations and resolved any difficulty in obtaining medication.

## 2018-04-04 ENCOUNTER — Telehealth: Payer: Self-pay | Admitting: *Deleted

## 2018-04-04 NOTE — Telephone Encounter (Signed)
Patient informed. Copy sent to PCP °

## 2018-04-04 NOTE — Telephone Encounter (Signed)
-----   Message from Satira Sark, MD sent at 04/04/2018  8:29 AM EST ----- Results reviewed.  Please let him know renal function, hemoglobin, and platelets are normal range.  No change to Eliquis dosing. A copy of this test should be forwarded to Monico Blitz, MD.

## 2018-04-06 ENCOUNTER — Other Ambulatory Visit: Payer: Self-pay | Admitting: Cardiovascular Disease

## 2018-04-28 ENCOUNTER — Telehealth: Payer: Self-pay | Admitting: Cardiology

## 2018-04-28 MED ORDER — APIXABAN 5 MG PO TABS: 5.0000 mg | ORAL_TABLET | Freq: Two times a day (BID) | ORAL | 0 refills | Status: DC

## 2018-04-28 NOTE — Telephone Encounter (Signed)
Patient calling the office for samples of medication:   1.  What medication and dosage are you requesting samples for? Eliquis   2.  Are you currently out of this medication? almost

## 2018-04-28 NOTE — Telephone Encounter (Signed)
Patient notified samples ready for pick up.  Low income subsidy & Eliquis assistance information given today.

## 2018-09-18 ENCOUNTER — Telehealth: Payer: Self-pay | Admitting: Cardiology

## 2018-09-18 NOTE — Telephone Encounter (Signed)
Patient asking if or what he will need to do if  he has cataract surgery with his eliquis

## 2018-09-18 NOTE — Telephone Encounter (Signed)
Patient informed that he will need to have surgeon doing procedure fax a cardiac clearance request form to Dr. Domenic Polite.  Stated that he goes for initial visit on 10/07/2018.  Patient verbalized understanding.

## 2018-10-03 ENCOUNTER — Ambulatory Visit: Payer: Medicare PPO | Admitting: Cardiology

## 2018-10-03 ENCOUNTER — Encounter: Payer: Self-pay | Admitting: Cardiology

## 2018-10-03 ENCOUNTER — Other Ambulatory Visit: Payer: Self-pay

## 2018-10-03 VITALS — BP 102/68 | HR 95 | Temp 99.1°F | Ht 69.0 in | Wt 172.0 lb

## 2018-10-03 DIAGNOSIS — I4819 Other persistent atrial fibrillation: Secondary | ICD-10-CM

## 2018-10-03 DIAGNOSIS — I10 Essential (primary) hypertension: Secondary | ICD-10-CM

## 2018-10-03 NOTE — Patient Instructions (Signed)

## 2018-10-03 NOTE — Progress Notes (Signed)
Cardiology Office Note  Date: 10/03/2018   ID: Jackson Martin, DOB 1940-04-02, MRN [id number]  PCP:  Monico Blitz, MD  Cardiologist:  Rozann Lesches, MD Electrophysiologist:  None   Chief Complaint  Patient presents with  . Atrial Fibrillation    History of Present Illness: Jackson Martin is a 77 y.o. male last seen in January.  He presents for a routine follow-up visit.  Since last evaluation he does not report any significant sense of palpitations or shortness of breath with typical activities.  We discussed follow-up lab work on [employer].  He states that he just had lab work done with Dr. Manuella Ghazi, we are requesting the results.  He does not report any bleeding problems.  Past Medical History:  Diagnosis Date  . Anxiety   . Asthmatic bronchitis   . Atrial fibrillation ([place])   . Backache   . Colon polyps   . COPD (chronic obstructive pulmonary disease) ([place])   . Esophageal reflux   . Essential hypertension   . Gout   . Hemorrhoid   . Hyperlipidemia   . Insomnia   . Ischemic heart disease   . MI (myocardial infarction) ([place]) 2003  . Neck pain   . Plantar fasciitis   . Prostate cancer [facility])     Past Surgical History:  Procedure Laterality Date  . COLONOSCOPY    . COLONOSCOPY N/A 11/15/2016   Procedure: COLONOSCOPY;  Surgeon: Rogene Houston, MD;  [facility];  Service: Endoscopy;  Laterality: N/A;  1200  . POLYPECTOMY  11/15/2016   Procedure: POLYPECTOMY;  Surgeon: Rogene Houston, MD;  [facility];  Service: Endoscopy;;  colon  . PROSTATECTOMY  2001    Current Outpatient Medications  Medication Sig Dispense Refill  . albuterol (PROVENTIL) (2.5 MG/3ML) 0.083% nebulizer solution Take 2.5 mg by nebulization every 6 (six) hours as needed for wheezing or shortness of breath.    . allopurinol (ZYLOPRIM) 300 MG tablet Take 300 mg by mouth daily.     Marland Kitchen apixaban (ELIQUIS) 5 MG TABS tablet Take 1 tablet (5 mg total) by mouth 2 (two) times  daily. 42 tablet 0  . atorvastatin (LIPITOR) 20 MG tablet Take 20 mg by mouth at bedtime.     . bisacodyl (DULCOLAX) 5 MG EC tablet Take 5 mg by mouth as needed for moderate constipation.    . budesonide-formoterol (SYMBICORT) 80-4.5 MCG/ACT inhaler Inhale 2 puffs into the lungs 2 (two) times daily. 1 Inhaler 11  . CARTIA XT 240 MG 24 hr capsule TAKE 1 CAPSULE BY MOUTH ONCE DAILY 90 capsule 1  . citalopram (CELEXA) 20 MG tablet Take 20 mg by mouth daily with lunch.     . clonazePAM (KLONOPIN) 0.5 MG tablet Take 0.5 mg by mouth 2 (two) times daily as needed. For anxiety    . Docusate Sodium (COLACE PO) Take by mouth 2 (two) times daily.    . furosemide (LASIX) 20 MG tablet Take 20 mg by mouth daily as needed (for fluid retention.).     Marland Kitchen hydrocortisone (ANUSOL-HC) 2.5 % rectal cream Place 1 application rectally 2 (two) times daily. 30 g 1  . Melatonin 10 MG TABS Take 1 tablet by mouth at bedtime.    . mirtazapine (REMERON) 15 MG tablet Take 15 mg by mouth at bedtime.    . montelukast (SINGULAIR) 10 MG tablet Take 10 mg by mouth at bedtime.    . polyethylene glycol (MIRALAX / GLYCOLAX) packet Take  17 g by mouth as needed for moderate constipation.    . Potassium Chloride (KLOR-CON 10 PO) Take 10 mEq by mouth daily as needed (FOR FLUID RETENTION/WITH LASIX).     Marland Kitchen sertraline (ZOLOFT) 50 MG tablet Take 1 tablet by mouth daily.    . VENLAFAXINE HCL PO Take by mouth.    . Wheat Dextrin (BENEFIBER DRINK MIX) PACK Take 4 g by mouth at bedtime.     No current facility-administered medications for this visit.    Allergies:  Patient has no known allergies.   Social History: The patient  reports that he quit smoking about 27 years ago. His smoking use included cigarettes. He has a 2.25 pack-year smoking history. He has never used smokeless tobacco. He reports that he does not drink alcohol or use drugs.   ROS:  Please see the history of present illness. Otherwise, complete review of systems is positive  for bilateral cataracts pending further evaluation.  All other systems are reviewed and negative.   Physical Exam: VS:  BP 102/68   Pulse 95   Temp 99.1 F (37.3 C)   Ht 5\' 9"  (1.753 m)   Wt 172 lb (78 kg)   SpO2 98%   BMI 25.40 kg/m , BMI Body mass index is 25.4 kg/m.  Wt Readings from Last 3 Encounters:  10/03/18 172 lb (78 kg)  04/03/18 171 lb (77.6 kg)  10/07/17 168 lb 11.2 oz (76.5 kg)    General: Patient appears comfortable at rest. HEENT: Conjunctiva and lids normal, oropharynx clear. Neck: Supple, no elevated JVP or carotid bruits, no thyromegaly. Lungs: Clear to auscultation, nonlabored breathing at rest. Cardiac: Regularly irregular, no S3, soft systolic murmur. Abdomen: Soft, nontender, bowel sounds present. Extremities: No pitting edema, distal pulses 2+. Skin: Warm and dry. Musculoskeletal: No kyphosis. Neuropsychiatric: Alert and oriented x3, affect grossly appropriate.  ECG:  An ECG dated 04/03/2018 was personally reviewed today and demonstrated:  Atrial fibrillation with right bundle branch block, left anterior fascicular block, PVC.  Recent Labwork:  January 2020: BUN 10, creatinine 0.95, potassium 4.0, AST 17, ALT 14, hemoglobin 13.8, platelets 175  Other Studies Reviewed Today:  Echocardiogram 08/06/2016 [facility]): Normal LV wall thickness with LVEF 60-65%, normal right ventricular contraction, mild left atrial enlargement, mildly sclerotic aortic valve without stenosis, mild mitral regurgitation, mild tricuspid regurgitation, no pericardial effusion.  Assessment and Plan:  1.  Permanent atrial fibrillation with CHADSVASC score of 4.  He is symptomatically stable.  Continue Eliquis for stroke prophylaxis.  Heart rate adequately controlled on Cartia XT.  2.  Essential hypertension, blood pressure is well controlled today.  Medication Adjustments/Labs and Tests Ordered: Current medicines are reviewed at length with the patient today.   Concerns regarding medicines are outlined above.   Tests Ordered: No orders of the defined types were placed in this encounter.   Medication Changes: No orders of the defined types were placed in this encounter.   Disposition:  Follow up 6 months in the [place] office.  Signed, Satira Sark, MD, [facility] 10/03/2018 2:39 PM    [facility] [place], [place], [place] [address] Phone: [phone]; Fax: [phone]

## 2018-10-06 ENCOUNTER — Encounter: Payer: Self-pay | Admitting: *Deleted

## 2018-10-09 ENCOUNTER — Other Ambulatory Visit: Payer: Self-pay | Admitting: Cardiology

## 2018-11-14 DIAGNOSIS — H25811 Combined forms of age-related cataract, right eye: Secondary | ICD-10-CM | POA: Diagnosis not present

## 2018-11-14 DIAGNOSIS — H2511 Age-related nuclear cataract, right eye: Secondary | ICD-10-CM | POA: Diagnosis not present

## 2018-11-18 DIAGNOSIS — R079 Chest pain, unspecified: Secondary | ICD-10-CM | POA: Diagnosis not present

## 2018-11-18 DIAGNOSIS — R350 Frequency of micturition: Secondary | ICD-10-CM | POA: Diagnosis not present

## 2018-11-18 DIAGNOSIS — I10 Essential (primary) hypertension: Secondary | ICD-10-CM | POA: Diagnosis not present

## 2018-11-18 DIAGNOSIS — J449 Chronic obstructive pulmonary disease, unspecified: Secondary | ICD-10-CM | POA: Diagnosis not present

## 2018-11-18 DIAGNOSIS — Z6825 Body mass index (BMI) 25.0-25.9, adult: Secondary | ICD-10-CM | POA: Diagnosis not present

## 2018-11-18 DIAGNOSIS — Z299 Encounter for prophylactic measures, unspecified: Secondary | ICD-10-CM | POA: Diagnosis not present

## 2018-12-30 ENCOUNTER — Telehealth (INDEPENDENT_AMBULATORY_CARE_PROVIDER_SITE_OTHER): Payer: Self-pay | Admitting: Nurse Practitioner

## 2018-12-30 NOTE — Telephone Encounter (Signed)
Mitzie, pls call patient and let him know it has been more than 1 year since he has been in the office therefore he will need an office visit to refill his hydrocortisone cream. thx

## 2019-01-20 DIAGNOSIS — I4891 Unspecified atrial fibrillation: Secondary | ICD-10-CM | POA: Diagnosis not present

## 2019-01-20 DIAGNOSIS — J449 Chronic obstructive pulmonary disease, unspecified: Secondary | ICD-10-CM | POA: Diagnosis not present

## 2019-01-20 DIAGNOSIS — Z6825 Body mass index (BMI) 25.0-25.9, adult: Secondary | ICD-10-CM | POA: Diagnosis not present

## 2019-01-20 DIAGNOSIS — Z299 Encounter for prophylactic measures, unspecified: Secondary | ICD-10-CM | POA: Diagnosis not present

## 2019-01-20 DIAGNOSIS — I10 Essential (primary) hypertension: Secondary | ICD-10-CM | POA: Diagnosis not present

## 2019-03-20 DIAGNOSIS — I10 Essential (primary) hypertension: Secondary | ICD-10-CM | POA: Diagnosis not present

## 2019-03-20 DIAGNOSIS — J449 Chronic obstructive pulmonary disease, unspecified: Secondary | ICD-10-CM | POA: Diagnosis not present

## 2019-03-20 DIAGNOSIS — Z87891 Personal history of nicotine dependence: Secondary | ICD-10-CM | POA: Diagnosis not present

## 2019-03-20 DIAGNOSIS — I4891 Unspecified atrial fibrillation: Secondary | ICD-10-CM | POA: Diagnosis not present

## 2019-03-20 DIAGNOSIS — Z299 Encounter for prophylactic measures, unspecified: Secondary | ICD-10-CM | POA: Diagnosis not present

## 2019-03-20 DIAGNOSIS — Z6826 Body mass index (BMI) 26.0-26.9, adult: Secondary | ICD-10-CM | POA: Diagnosis not present

## 2019-03-24 ENCOUNTER — Telehealth: Payer: Self-pay | Admitting: Cardiology

## 2019-03-24 NOTE — Progress Notes (Signed)
Virtual Visit via Telephone Note   This visit type was conducted due to national recommendations for restrictions regarding the COVID-19 Pandemic (e.g. social distancing) in an effort to limit this patient's exposure and mitigate transmission in our community.  Due to his co-morbid illnesses, this patient is at least at moderate risk for complications without adequate follow up.  This format is felt to be most appropriate for this patient at this time.  The patient did not have access to video technology/had technical difficulties with video requiring transitioning to audio format only (telephone).  All issues noted in this document were discussed and addressed.  No physical exam could be performed with this format.  Please refer to the patient's chart for his  consent to telehealth for [facility].   Date:  03/25/2019   ID:  Jackson Martin, DOB 1940-10-27, MRN [id number]  Patient Location: Home Provider Location: Office  PCP:  Monico Blitz, MD  Cardiologist:  Rozann Lesches, MD Electrophysiologist:  None   Evaluation Performed:  Follow-Up Visit  Chief Complaint:  Cardiac follow-up  History of Present Illness:    Jackson Martin is an 79 y.o. male last seen in July 2020.  We spoke by phone today.  He states that he has been staying at his house during the pandemic, wears a mask when he goes out.  He has had no major changes in symptoms until earlier in the week.  He states that he had an episode of apparent syncope after getting out of the shower, standing and drying off.  He does not recall any sense of palpitations or chest pain.  He was not able to get up for several minutes, but states that eventually he felt back to normal and has had no further symptoms.  He does not recall any sudden dizziness or other syncopal events.  He states that he has been taking his medications regularly.  He did not seek medical attention or inform Dr. Manuella Ghazi.  I reviewed his lab work from July 2020 as  outlined below.  He has not had interval follow-up.  I reviewed his medications which are outlined below.  He reports no obvious bleeding problems on Eliquis.  Also continues on Cartia XT for heart rate control.  The patient does not have symptoms concerning for COVID-19 infection (fever, chills, cough, or new shortness of breath).    Past Medical History:  Diagnosis Date  . Anxiety   . Asthmatic bronchitis   . Atrial fibrillation ([place])   . Backache   . Colon polyps   . COPD (chronic obstructive pulmonary disease) ([place])   . Esophageal reflux   . Essential hypertension   . Gout   . Hemorrhoid   . Hyperlipidemia   . Insomnia   . Ischemic heart disease   . MI (myocardial infarction) ([place]) 2003  . Neck pain   . Plantar fasciitis   . Prostate cancer [facility])    Past Surgical History:  Procedure Laterality Date  . COLONOSCOPY    . COLONOSCOPY N/A 11/15/2016   Procedure: COLONOSCOPY;  Surgeon: Rogene Houston, MD;  [facility];  Service: Endoscopy;  Laterality: N/A;  1200  . POLYPECTOMY  11/15/2016   Procedure: POLYPECTOMY;  Surgeon: Rogene Houston, MD;  [facility];  Service: Endoscopy;;  colon  . PROSTATECTOMY  2001     Current Meds  Medication Sig  . albuterol (PROVENTIL) (2.5 MG/3ML) 0.083% nebulizer solution Take 2.5 mg by nebulization every 6 (  six) hours as needed for wheezing or shortness of breath.  . allopurinol (ZYLOPRIM) 300 MG tablet Take 300 mg by mouth daily.   Marland Kitchen apixaban (ELIQUIS) 5 MG TABS tablet Take 1 tablet (5 mg total) by mouth 2 (two) times daily.  Marland Kitchen atorvastatin (LIPITOR) 20 MG tablet Take 20 mg by mouth at bedtime.   . bisacodyl (DULCOLAX) 5 MG EC tablet Take 5 mg by mouth as needed for moderate constipation.  . budesonide-formoterol (SYMBICORT) 80-4.5 MCG/ACT inhaler Inhale 2 puffs into the lungs 2 (two) times daily.  Marland Kitchen CARTIA XT 240 MG 24 hr capsule Take 1 capsule by mouth once daily  . citalopram (CELEXA) 20 MG tablet Take 20 mg by  mouth daily with lunch.   . clonazePAM (KLONOPIN) 0.5 MG tablet Take 0.5 mg by mouth 2 (two) times daily as needed. For anxiety  . Docusate Sodium (COLACE PO) Take by mouth 2 (two) times daily.  . furosemide (LASIX) 20 MG tablet Take 20 mg by mouth daily as needed (for fluid retention.).   Marland Kitchen hydrocortisone (ANUSOL-HC) 2.5 % rectal cream Place 1 application rectally 2 (two) times daily.  . Melatonin 10 MG TABS Take 1 tablet by mouth at bedtime.  . mirtazapine (REMERON) 15 MG tablet Take 15 mg by mouth at bedtime.  . montelukast (SINGULAIR) 10 MG tablet Take 10 mg by mouth at bedtime.  . polyethylene glycol (MIRALAX / GLYCOLAX) packet Take 17 g by mouth as needed for moderate constipation.  . Potassium Chloride (KLOR-CON 10 PO) Take 10 mEq by mouth daily as needed (FOR FLUID RETENTION/WITH LASIX).   Marland Kitchen sertraline (ZOLOFT) 50 MG tablet Take 1 tablet by mouth daily.  Marland Kitchen venlafaxine XR (EFFEXOR-XR) 75 MG 24 hr capsule Take 1 capsule by mouth daily.  . Wheat Dextrin (BENEFIBER DRINK MIX) PACK Take 4 g by mouth at bedtime.  . [DISCONTINUED] VENLAFAXINE HCL PO Take by mouth.     Allergies:   Patient has no known allergies.   Social History   Tobacco Use  . Smoking status: Former Smoker    Packs/day: 0.75    Years: 3.00    Pack years: 2.25    Types: Cigarettes    Quit date: 03/06/1991    Years since quitting: 28.0  . Smokeless tobacco: Never Used  Substance Use Topics  . Alcohol use: No  . Drug use: No     Family Hx: The patient's family history includes Breast cancer in his sister; Colon cancer in his brother; Heart disease in his maternal aunt, maternal grandfather, maternal grandmother, maternal uncle, and mother; Hypertension in his mother; Lung cancer in his sister; Lung disease in his sister.  ROS:   Please see the history of present illness. All other systems reviewed and are negative.   Prior CV studies:   The following studies were reviewed today:  Echocardiogram 08/06/2016  [facility]): Normal LV wall thickness with LVEF 60-65%, normal right ventricular contraction, mild left atrial enlargement, mildly sclerotic aortic valve without stenosis, mild mitral regurgitation, mild tricuspid regurgitation, no pericardial effusion.  Labs/Other Tests and Data Reviewed:    EKG:  An ECG dated 04/03/2018 was personally reviewed today and demonstrated:  Atrial fibrillation with PVC and nonspecific ST-T changes.  Recent Labs:  July 2020: Cholesterol 146, triglycerides 80, HDL 57, LDL 73, TSH 2.74, hemoglobin 14.5, platelets 221, BUN 8, creatinine 1.07, potassium 4.1, AST 14, ALT 10  Wt Readings from Last 3 Encounters:  03/25/19 179 lb (81.2 kg)  10/03/18 172  lb (78 kg)  04/03/18 171 lb (77.6 kg)     Objective:    Vital Signs:  Ht 5\' 9"  (1.753 m)   Wt 179 lb (81.2 kg)   BMI 26.43 kg/m    Patient was not able to provide vital signs today. He spoke in full sentences, not short of breath. No audible wheezing or coughing. Speech pattern normal.  ASSESSMENT & PLAN:    1.  Permanent atrial fibrillation with CHA2DS2-VASc score of 4.  He continues on Eliquis and Cartia XT, no obvious palpitations described.  Plan to obtain CBC and BMET.  He does not report any obvious bleeding problems.  2.  Recent reported syncopal episode, possibly vasovagal in etiology based on description.  He did not seek medical attention, states that he is back to normal.  We will have him come in for a nurse visit to obtain vital signs, orthostatics, and ECG.  3.  Essential hypertension by history.  He continues on stable medical regimen as outlined above.  COVID-19 Education: The signs and symptoms of COVID-19 were discussed with the patient and how to seek care for testing (follow up with PCP or arrange E-visit).  The importance of social distancing was discussed today.  Time:   Today, I have spent 8 minutes with the patient with telehealth technology discussing the above  problems.     Medication Adjustments/Labs and Tests Ordered: Current medicines are reviewed at length with the patient today.  Concerns regarding medicines are outlined above.   Tests Ordered: No orders of the defined types were placed in this encounter.   Medication Changes: No orders of the defined types were placed in this encounter.   Follow Up:  In Person for nurse visit as outlined and then determine disposition.  Signed, Rozann Lesches, MD  03/25/2019 9:44 AM    [facility]

## 2019-03-24 NOTE — Telephone Encounter (Signed)

## 2019-03-25 ENCOUNTER — Telehealth: Payer: Self-pay | Admitting: Cardiology

## 2019-03-25 ENCOUNTER — Encounter: Payer: Self-pay | Admitting: *Deleted

## 2019-03-25 ENCOUNTER — Encounter: Payer: Self-pay | Admitting: Cardiology

## 2019-03-25 ENCOUNTER — Telehealth (INDEPENDENT_AMBULATORY_CARE_PROVIDER_SITE_OTHER): Payer: Medicare PPO | Admitting: Cardiology

## 2019-03-25 VITALS — Ht 69.0 in | Wt 179.0 lb

## 2019-03-25 DIAGNOSIS — I10 Essential (primary) hypertension: Secondary | ICD-10-CM | POA: Diagnosis not present

## 2019-03-25 DIAGNOSIS — I4819 Other persistent atrial fibrillation: Secondary | ICD-10-CM

## 2019-03-25 DIAGNOSIS — R55 Syncope and collapse: Secondary | ICD-10-CM | POA: Diagnosis not present

## 2019-03-25 DIAGNOSIS — Z79899 Other long term (current) drug therapy: Secondary | ICD-10-CM

## 2019-03-25 NOTE — Telephone Encounter (Signed)
Wants labs done at [facility] behind [place]   Please call patient to let him know that he can

## 2019-03-25 NOTE — Telephone Encounter (Signed)
Patient informed that [facility] Martin and [facility] office don't do lab work for our office. Patient advised that he can have his lab work done at his lab of choice. Verbalized understanding.

## 2019-03-25 NOTE — Patient Instructions (Addendum)
Medication Instructions:   Your physician recommends that you continue on your current medications as directed. Please refer to the Current Medication list given to you today.  Labwork:  Your physician recommends that you return for non-fasting lab work in: as soon as possible to check your BMET & CBC. Your lab orders can be picked up when you come for your nurse visit next week.   Testing/Procedures:  NONE  Follow-Up:  Your physician recommends that you schedule a follow-up appointment in: pending nurse visit  Your physician recommends that you schedule an appointment for a nurse visit for an EKG and full vitals including orthostatic blood pressures.   Any Other Special Instructions Will Be Listed Below (If Applicable).  If you need a refill on your cardiac medications before your next appointment, please call your pharmacy.

## 2019-03-31 ENCOUNTER — Other Ambulatory Visit (HOSPITAL_COMMUNITY)
Admission: RE | Admit: 2019-03-31 | Discharge: 2019-03-31 | Disposition: A | Discharge status: Home / Self Care | Payer: Medicare PPO | Source: Ambulatory Visit | Attending: Cardiology | Admitting: Cardiology

## 2019-03-31 DIAGNOSIS — I10 Essential (primary) hypertension: Secondary | ICD-10-CM | POA: Diagnosis not present

## 2019-03-31 DIAGNOSIS — Z79899 Other long term (current) drug therapy: Secondary | ICD-10-CM | POA: Insufficient documentation

## 2019-03-31 DIAGNOSIS — I4819 Other persistent atrial fibrillation: Secondary | ICD-10-CM | POA: Insufficient documentation

## 2019-03-31 LAB — CBC
HCT: 42.8 % (ref 39.0–52.0)
Hemoglobin: 14 g/dL (ref 13.0–17.0)
MCH: 31.6 pg (ref 26.0–34.0)
MCHC: 32.7 g/dL (ref 30.0–36.0)
MCV: 96.6 fL (ref 80.0–100.0)
Platelets: 250 10*3/uL (ref 150–400)
RBC: 4.43 MIL/uL (ref 4.22–5.81)
RDW: 13 % (ref 11.5–15.5)
WBC: 5.6 10*3/uL (ref 4.0–10.5)
nRBC: 0 % (ref 0.0–0.2)

## 2019-03-31 LAB — BASIC METABOLIC PANEL
Anion gap: 8 (ref 5–15)
BUN: 8 mg/dL (ref 8–23)
CO2: 29 mmol/L (ref 22–32)
Calcium: 8.3 mg/dL — ABNORMAL LOW (ref 8.9–10.3)
Chloride: 103 mmol/L (ref 98–111)
Creatinine, Ser: 0.93 mg/dL (ref 0.61–1.24)
GFR calc Af Amer: >60 mL/min (ref >60)
GFR calc non Af Amer: >60 mL/min (ref >60)
Glucose, Bld: 103 mg/dL — ABNORMAL HIGH (ref 70–99)
Potassium: 3.5 mmol/L (ref 3.5–5.1)
Sodium: 140 mmol/L (ref 135–145)

## 2019-04-02 ENCOUNTER — Ambulatory Visit: Payer: Medicare PPO

## 2019-04-02 ENCOUNTER — Telehealth: Payer: Self-pay | Admitting: *Deleted

## 2019-04-02 NOTE — Telephone Encounter (Signed)
-----   Message from Satira Sark, MD sent at 03/31/2019  2:07 PM EST ----- Results reviewed.  Renal function and potassium remain normal.

## 2019-04-02 NOTE — Telephone Encounter (Signed)
-----   Message from Satira Sark, MD sent at 03/31/2019  1:39 PM EST ----- Results reviewed.  Hemoglobin and platelet count normal.

## 2019-04-02 NOTE — Telephone Encounter (Signed)
Patient informed. Copy sent to PCP °

## 2019-04-21 ENCOUNTER — Ambulatory Visit (INDEPENDENT_AMBULATORY_CARE_PROVIDER_SITE_OTHER): Payer: Medicare PPO | Admitting: *Deleted

## 2019-04-21 ENCOUNTER — Other Ambulatory Visit: Payer: Self-pay | Admitting: *Deleted

## 2019-04-21 ENCOUNTER — Other Ambulatory Visit: Payer: Self-pay

## 2019-04-21 ENCOUNTER — Encounter: Payer: Self-pay | Admitting: *Deleted

## 2019-04-21 ENCOUNTER — Telehealth: Payer: Self-pay | Admitting: *Deleted

## 2019-04-21 VITALS — BP 130/75 | HR 95 | Ht 69.0 in | Wt 173.4 lb

## 2019-04-21 DIAGNOSIS — I4819 Other persistent atrial fibrillation: Secondary | ICD-10-CM

## 2019-04-21 MED ORDER — APIXABAN 5 MG PO TABS: 5.0000 mg | ORAL_TABLET | Freq: Two times a day (BID) | ORAL | 0 refills | Status: DC

## 2019-04-21 MED ORDER — APIXABAN 5 MG PO TABS: 5.0000 mg | ORAL_TABLET | Freq: Two times a day (BID) | ORAL | 2 refills | Status: DC

## 2019-04-21 MED ORDER — MEDICAL COMPRESSION STOCKINGS MISC: 1.0000 | 0 refills | Status: DC

## 2019-04-21 NOTE — Progress Notes (Signed)
ECG reviewed.  Mild orthostatic changes noted.  Would consider using low-level below the knee compression stockings during daytime hours.

## 2019-04-21 NOTE — Patient Instructions (Signed)
Continue same medications Return proof of income for eliquis patient assistance Same follow up plans

## 2019-04-21 NOTE — Progress Notes (Signed)
Presents to office for nurse visit to have EKG and orthostatic BP check per last tele-health visit. Continues to report dizziness. Denies chest pain or sob. Has taken all doses of medications without side effects. Medications reviewed. Patient assistance application filled out for elquis. Awaiting proof of income information.

## 2019-04-21 NOTE — Telephone Encounter (Addendum)
-----   Message from Satira Sark, MD sent at 04/21/2019 10:02 AM EST ----- ECG reviewed.  Mild orthostatic changes noted.  Would consider using low-level below the knee compression stockings during daytime hours.

## 2019-04-21 NOTE — Telephone Encounter (Signed)
Patient informed and verbalized understanding of plan. 

## 2019-05-13 ENCOUNTER — Other Ambulatory Visit: Payer: Self-pay | Admitting: *Deleted

## 2019-05-13 MED ORDER — APIXABAN 5 MG PO TABS: 5.0000 mg | ORAL_TABLET | Freq: Two times a day (BID) | ORAL | 0 refills | Status: DC

## 2019-05-19 ENCOUNTER — Telehealth: Payer: Self-pay | Admitting: Cardiology

## 2019-05-19 ENCOUNTER — Encounter: Payer: Self-pay | Admitting: *Deleted

## 2019-05-19 MED ORDER — APIXABAN 5 MG PO TABS: 5.0000 mg | ORAL_TABLET | Freq: Two times a day (BID) | ORAL | 2 refills | Status: DC

## 2019-05-19 NOTE — Telephone Encounter (Signed)
Per eliquis 360 support and was transferred to [facility] patient assistance, spoke with Maudie Mercury who says that It was an initial denial. Per Maudie Mercury, patient need to send proof of out of pocket expense of at least $255.74 for medications/pharmacy only. Send this to [employer] to make a second determination.   [place] contacted and spoke with pharmacist Diane. Request to send proof of $355.58 spent on eliquis on 03/19/2019. Per Shauna Hugh, she will fax to our office.

## 2019-05-19 NOTE — Telephone Encounter (Signed)
Notification received from [employer] patient assistance that eliquis assistance is denied due to patient having insurance that covers this drug.  Patient notified and says he can not afford eliquis each month. Patient says it cost him $250/mth. Patient is requesting eliquis be changed to something cheaper. Advised that [facility] would be contacted to verify cost and provider would be notified also.   [place] contacted to verify cost and per pharmacist, copay each month for eliquis 5 mg is  $414 for 90 day supply and $306 for one month supply. January 2021, patient paid $355.58 for one month supply of eliquis 5 mg Per pharmacist, patient has paid a total of $265 on his deductible so far this year.

## 2019-05-19 NOTE — Telephone Encounter (Signed)
This encounter was created in error - please disregard.

## 2019-05-19 NOTE — Telephone Encounter (Signed)
Patient got another form and wanting to bring it down to office and talk to [facility]

## 2019-05-21 NOTE — Telephone Encounter (Signed)
Patient advised that he needed to spend at least $125.00 to meet deductible, then can receive help from [employer]. Advised to bring receipt to office once he receives it. Verbalized understanding.

## 2019-05-21 NOTE — Telephone Encounter (Signed)
Fax received from [facility] and shows that patient paid $355.58 on 03/18/2018. Per pharmacist Diane, she gave incorrect information. Patient will be contacted and informed that he needed to pay a total of $255.74 out of pocket for year 2021 and then [employer] will assist him with assistance for eliquis.

## 2019-05-22 NOTE — Telephone Encounter (Addendum)
Patient informed and is aware that he still has $115.49 out of pocket expense to meet out of pocket criteria for [employer] to get free eliquis. Patient aware that he can bring receipt for proof at his leisure.

## 2019-05-22 NOTE — Telephone Encounter (Signed)
Contacted [employer] to check status of out of pocket expense submitted, per [facility], patient still needed to pay $115.49 out of pocket to qualify for free eliquis through [employer]. Advised Shaniece that this information was not provided on yesterday when call made to clarify total amount. Per [place], the total amount out of pocket patient need to pay is $524.38 and $131.24 was already accounted towards that amount.

## 2019-05-25 DIAGNOSIS — H0100B Unspecified blepharitis left eye, upper and lower eyelids: Secondary | ICD-10-CM | POA: Diagnosis not present

## 2019-05-25 DIAGNOSIS — H0100A Unspecified blepharitis right eye, upper and lower eyelids: Secondary | ICD-10-CM | POA: Diagnosis not present

## 2019-06-09 ENCOUNTER — Encounter: Payer: Self-pay | Admitting: *Deleted

## 2019-06-09 DIAGNOSIS — I4891 Unspecified atrial fibrillation: Secondary | ICD-10-CM | POA: Diagnosis not present

## 2019-06-09 DIAGNOSIS — S41119A Laceration without foreign body of unspecified upper arm, initial encounter: Secondary | ICD-10-CM | POA: Diagnosis not present

## 2019-06-09 DIAGNOSIS — D692 Other nonthrombocytopenic purpura: Secondary | ICD-10-CM | POA: Diagnosis not present

## 2019-06-09 DIAGNOSIS — Z299 Encounter for prophylactic measures, unspecified: Secondary | ICD-10-CM | POA: Diagnosis not present

## 2019-06-09 DIAGNOSIS — E7800 Pure hypercholesterolemia, unspecified: Secondary | ICD-10-CM | POA: Diagnosis not present

## 2019-06-09 DIAGNOSIS — J449 Chronic obstructive pulmonary disease, unspecified: Secondary | ICD-10-CM | POA: Diagnosis not present

## 2019-06-09 DIAGNOSIS — I10 Essential (primary) hypertension: Secondary | ICD-10-CM | POA: Diagnosis not present

## 2019-06-09 NOTE — Progress Notes (Signed)
Per patient request, arms observed scattered small purple spots on both arms. Denies injury or bumping arms. Advised that purple spots are not uncommon for his age. Advised to contact his PCP for an evaluation. Verbalized understanding.

## 2019-06-19 DIAGNOSIS — I10 Essential (primary) hypertension: Secondary | ICD-10-CM | POA: Diagnosis not present

## 2019-06-19 DIAGNOSIS — Z299 Encounter for prophylactic measures, unspecified: Secondary | ICD-10-CM | POA: Diagnosis not present

## 2019-06-19 DIAGNOSIS — J449 Chronic obstructive pulmonary disease, unspecified: Secondary | ICD-10-CM | POA: Diagnosis not present

## 2019-06-19 DIAGNOSIS — R350 Frequency of micturition: Secondary | ICD-10-CM | POA: Diagnosis not present

## 2019-06-19 DIAGNOSIS — I4891 Unspecified atrial fibrillation: Secondary | ICD-10-CM | POA: Diagnosis not present

## 2019-07-07 IMAGING — DX DG CHEST 2V
2 series · 2 of 2 positions shown · non-contrast
Comparison: 07/26/2016 and 09/21/2015

CLINICAL DATA: Shortness of breath for 4 months. Asthma and COPD.
Dyspnea on exertion.

EXAM:
CHEST  2 VIEW

[chest pa]
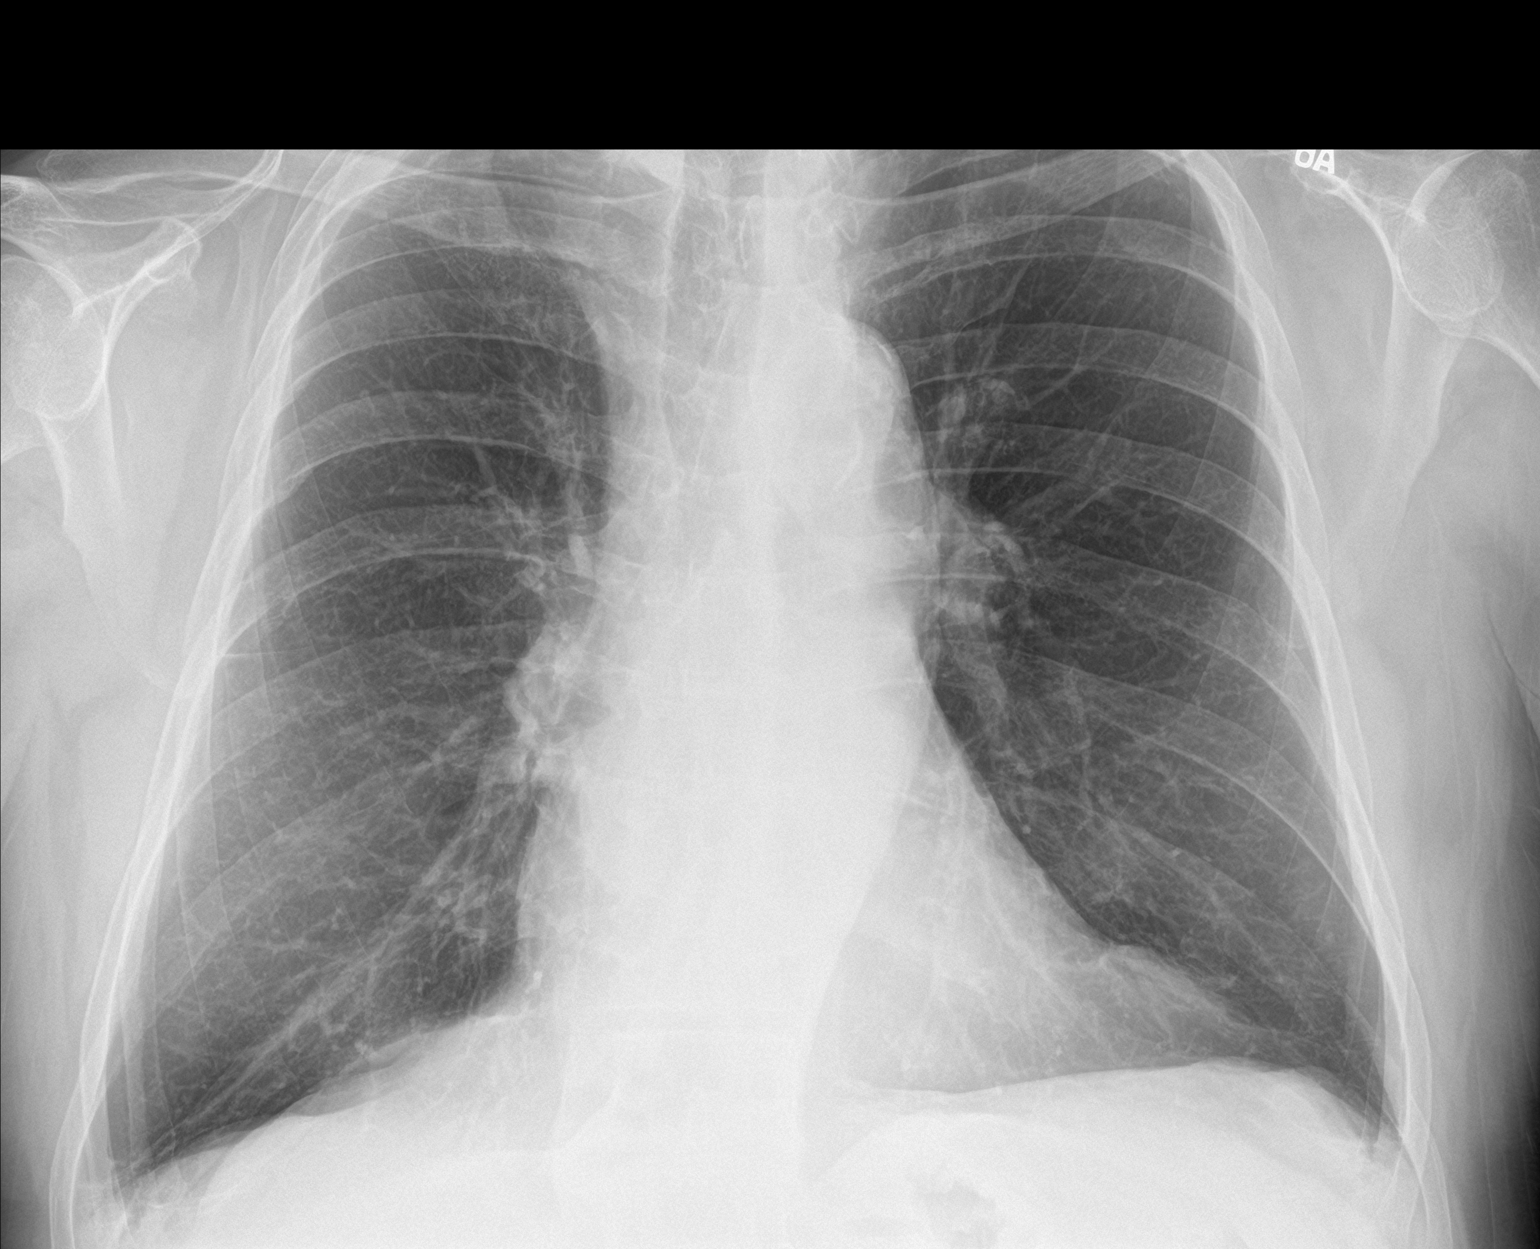

[chest lat]
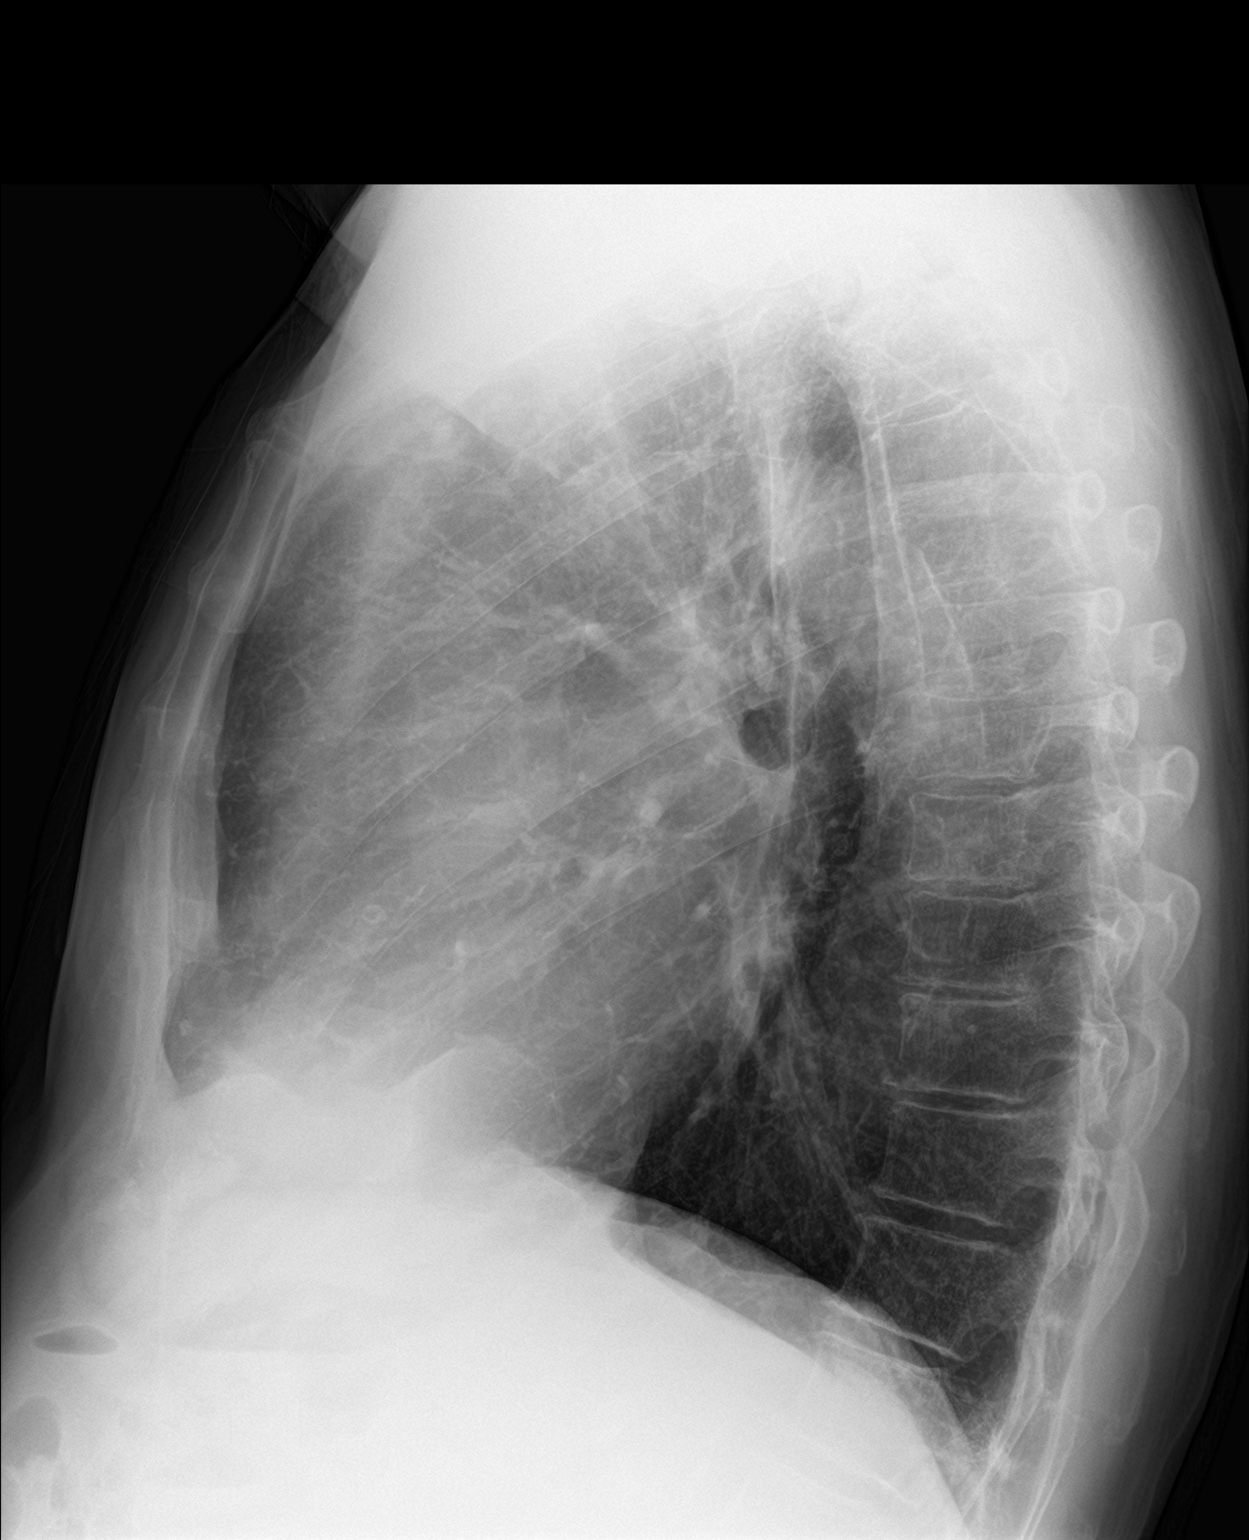

[2 of 2 positions shown; findings below may reference images not displayed]

FINDINGS: Lungs are clear without focal airspace disease or pulmonary edema.
Heart size is within normal limits. Stable appearance of the heart
and mediastinum. Atherosclerotic calcifications at the aortic arch.
Trachea is midline. No large pleural effusions. Stable scarring at
the lung apices. Probable scarring at the costophrenic angles. Old
right rib fractures.
IMPRESSION: No active cardiopulmonary disease.

## 2019-07-21 ENCOUNTER — Other Ambulatory Visit: Payer: Self-pay | Admitting: Cardiology

## 2019-08-06 ENCOUNTER — Telehealth: Payer: Self-pay | Admitting: Cardiology

## 2019-08-06 NOTE — Telephone Encounter (Signed)
Called to ask Jackson Martin if he had enough credits for him to get assistance with blood thinner

## 2019-08-06 NOTE — Telephone Encounter (Signed)
Patient informed and verbalized understanding of plan. 

## 2019-08-06 NOTE — Telephone Encounter (Signed)
Contacted [employer] to check status of application after faxing remainder of $115.49 receipts needed for assistance with eliquis on 07/27/2019. Per Jonelle Sidle, they have received all that is needed and she will send it to processing which takes 3-5 business days for a response/decision.

## 2019-08-11 NOTE — Telephone Encounter (Signed)
Contacted [employer] to check processing status for assistance with eliquis, per [place], she will get this processed today.

## 2019-08-13 ENCOUNTER — Telehealth: Payer: Self-pay | Admitting: *Deleted

## 2019-08-13 NOTE — Telephone Encounter (Signed)
Notification received from [employer] that assistance for eliquis approved until the remainder of the year.  Patient informed and verbalized understanding of plan.

## 2019-09-02 ENCOUNTER — Other Ambulatory Visit: Payer: Self-pay

## 2019-09-02 ENCOUNTER — Emergency Department (HOSPITAL_COMMUNITY): Payer: Medicare PPO

## 2019-09-02 ENCOUNTER — Encounter (HOSPITAL_COMMUNITY): Payer: Self-pay | Admitting: Emergency Medicine

## 2019-09-02 ENCOUNTER — Observation Stay (HOSPITAL_COMMUNITY)
Admission: EM | Admit: 2019-09-02 | Discharge: 2019-09-02 | Disposition: A | Discharge status: Home / Self Care | Payer: Medicare PPO | Attending: Family Medicine | Admitting: Family Medicine

## 2019-09-02 DIAGNOSIS — Z03818 Encounter for observation for suspected exposure to other biological agents ruled out: Secondary | ICD-10-CM | POA: Diagnosis not present

## 2019-09-02 DIAGNOSIS — Z7901 Long term (current) use of anticoagulants: Secondary | ICD-10-CM | POA: Diagnosis not present

## 2019-09-02 DIAGNOSIS — Z9079 Acquired absence of other genital organ(s): Secondary | ICD-10-CM | POA: Diagnosis not present

## 2019-09-02 DIAGNOSIS — J449 Chronic obstructive pulmonary disease, unspecified: Secondary | ICD-10-CM | POA: Diagnosis not present

## 2019-09-02 DIAGNOSIS — I10 Essential (primary) hypertension: Secondary | ICD-10-CM | POA: Diagnosis not present

## 2019-09-02 DIAGNOSIS — K5732 Diverticulitis of large intestine without perforation or abscess without bleeding: Secondary | ICD-10-CM | POA: Diagnosis not present

## 2019-09-02 DIAGNOSIS — M109 Gout, unspecified: Secondary | ICD-10-CM | POA: Diagnosis not present

## 2019-09-02 DIAGNOSIS — Z8249 Family history of ischemic heart disease and other diseases of the circulatory system: Secondary | ICD-10-CM | POA: Diagnosis not present

## 2019-09-02 DIAGNOSIS — I700 Atherosclerosis of aorta: Secondary | ICD-10-CM | POA: Insufficient documentation

## 2019-09-02 DIAGNOSIS — K219 Gastro-esophageal reflux disease without esophagitis: Secondary | ICD-10-CM | POA: Diagnosis not present

## 2019-09-02 DIAGNOSIS — I252 Old myocardial infarction: Secondary | ICD-10-CM | POA: Diagnosis not present

## 2019-09-02 DIAGNOSIS — E785 Hyperlipidemia, unspecified: Secondary | ICD-10-CM | POA: Insufficient documentation

## 2019-09-02 DIAGNOSIS — Z8546 Personal history of malignant neoplasm of prostate: Secondary | ICD-10-CM | POA: Insufficient documentation

## 2019-09-02 DIAGNOSIS — Z87891 Personal history of nicotine dependence: Secondary | ICD-10-CM | POA: Diagnosis not present

## 2019-09-02 DIAGNOSIS — Z7951 Long term (current) use of inhaled steroids: Secondary | ICD-10-CM | POA: Insufficient documentation

## 2019-09-02 DIAGNOSIS — I255 Ischemic cardiomyopathy: Secondary | ICD-10-CM | POA: Insufficient documentation

## 2019-09-02 DIAGNOSIS — Z20822 Contact with and (suspected) exposure to covid-19: Secondary | ICD-10-CM | POA: Diagnosis not present

## 2019-09-02 DIAGNOSIS — G4700 Insomnia, unspecified: Secondary | ICD-10-CM | POA: Diagnosis not present

## 2019-09-02 DIAGNOSIS — Z79899 Other long term (current) drug therapy: Secondary | ICD-10-CM | POA: Insufficient documentation

## 2019-09-02 DIAGNOSIS — I951 Orthostatic hypotension: Secondary | ICD-10-CM | POA: Insufficient documentation

## 2019-09-02 DIAGNOSIS — K449 Diaphragmatic hernia without obstruction or gangrene: Secondary | ICD-10-CM | POA: Insufficient documentation

## 2019-09-02 DIAGNOSIS — I878 Other specified disorders of veins: Secondary | ICD-10-CM | POA: Diagnosis not present

## 2019-09-02 DIAGNOSIS — K6389 Other specified diseases of intestine: Secondary | ICD-10-CM | POA: Diagnosis not present

## 2019-09-02 DIAGNOSIS — Z86010 Personal history of colonic polyps: Secondary | ICD-10-CM | POA: Diagnosis not present

## 2019-09-02 DIAGNOSIS — Z8719 Personal history of other diseases of the digestive system: Secondary | ICD-10-CM | POA: Insufficient documentation

## 2019-09-02 DIAGNOSIS — F419 Anxiety disorder, unspecified: Secondary | ICD-10-CM | POA: Diagnosis not present

## 2019-09-02 DIAGNOSIS — K5792 Diverticulitis of intestine, part unspecified, without perforation or abscess without bleeding: Secondary | ICD-10-CM | POA: Diagnosis present

## 2019-09-02 DIAGNOSIS — Z801 Family history of malignant neoplasm of trachea, bronchus and lung: Secondary | ICD-10-CM | POA: Insufficient documentation

## 2019-09-02 DIAGNOSIS — I4821 Permanent atrial fibrillation: Secondary | ICD-10-CM | POA: Diagnosis not present

## 2019-09-02 DIAGNOSIS — I708 Atherosclerosis of other arteries: Secondary | ICD-10-CM | POA: Insufficient documentation

## 2019-09-02 DIAGNOSIS — K314 Gastric diverticulum: Secondary | ICD-10-CM | POA: Diagnosis not present

## 2019-09-02 DIAGNOSIS — R1032 Left lower quadrant pain: Secondary | ICD-10-CM | POA: Diagnosis not present

## 2019-09-02 DIAGNOSIS — Z803 Family history of malignant neoplasm of breast: Secondary | ICD-10-CM | POA: Insufficient documentation

## 2019-09-02 LAB — COMPREHENSIVE METABOLIC PANEL
ALT: 10 U/L (ref 0–44)
AST: 16 U/L (ref 15–41)
Albumin: 3.4 g/dL — ABNORMAL LOW (ref 3.5–5.0)
Alkaline Phosphatase: 121 U/L (ref 38–126)
Anion gap: 7 (ref 5–15)
BUN: 9 mg/dL (ref 8–23)
CO2: 29 mmol/L (ref 22–32)
Calcium: 8.7 mg/dL — ABNORMAL LOW (ref 8.9–10.3)
Chloride: 101 mmol/L (ref 98–111)
Creatinine, Ser: 1.11 mg/dL (ref 0.61–1.24)
GFR calc Af Amer: >60 mL/min (ref >60)
GFR calc non Af Amer: >60 mL/min (ref >60)
Glucose, Bld: 114 mg/dL — ABNORMAL HIGH (ref 70–99)
Potassium: 3.4 mmol/L — ABNORMAL LOW (ref 3.5–5.1)
Sodium: 137 mmol/L (ref 135–145)
Total Bilirubin: 1.3 mg/dL — ABNORMAL HIGH (ref 0.3–1.2)
Total Protein: 6.5 g/dL (ref 6.5–8.1)

## 2019-09-02 LAB — CBC WITH DIFFERENTIAL/PLATELET
Abs Immature Granulocytes: 0.02 10*3/uL (ref 0.00–0.07)
Basophils Absolute: 0.1 10*3/uL (ref 0.0–0.1)
Basophils Relative: 1 %
Eosinophils Absolute: 0.3 10*3/uL (ref 0.0–0.5)
Eosinophils Relative: 3 %
HCT: 41.1 % (ref 39.0–52.0)
Hemoglobin: 13.5 g/dL (ref 13.0–17.0)
Immature Granulocytes: 0 %
Lymphocytes Relative: 11 %
Lymphs Abs: 1 10*3/uL (ref 0.7–4.0)
MCH: 31.8 pg (ref 26.0–34.0)
MCHC: 32.8 g/dL (ref 30.0–36.0)
MCV: 96.7 fL (ref 80.0–100.0)
Monocytes Absolute: 0.6 10*3/uL (ref 0.1–1.0)
Monocytes Relative: 6 %
Neutro Abs: 7 10*3/uL (ref 1.7–7.7)
Neutrophils Relative %: 79 %
Platelets: 187 10*3/uL (ref 150–400)
RBC: 4.25 MIL/uL (ref 4.22–5.81)
RDW: 13.3 % (ref 11.5–15.5)
WBC: 9 10*3/uL (ref 4.0–10.5)
nRBC: 0 % (ref 0.0–0.2)

## 2019-09-02 LAB — URINALYSIS, ROUTINE W REFLEX MICROSCOPIC
Bilirubin Urine: NEGATIVE
Glucose, UA: NEGATIVE mg/dL
Hgb urine dipstick: NEGATIVE
Ketones, ur: NEGATIVE mg/dL
Leukocytes,Ua: NEGATIVE
Nitrite: NEGATIVE
Protein, ur: NEGATIVE mg/dL
Specific Gravity, Urine: 1.02 (ref 1.005–1.030)
pH: 7 (ref 5.0–8.0)

## 2019-09-02 LAB — SARS CORONAVIRUS 2 BY RT PCR (HOSPITAL ORDER, PERFORMED IN ~~LOC~~ HOSPITAL LAB): SARS Coronavirus 2: NEGATIVE

## 2019-09-02 LAB — LACTIC ACID, PLASMA: Lactic Acid, Venous: 1.2 mmol/L (ref 0.5–1.9)

## 2019-09-02 LAB — POC OCCULT BLOOD, ED: Fecal Occult Bld: POSITIVE — AB

## 2019-09-02 LAB — LIPASE, BLOOD: Lipase: 21 U/L (ref 11–51)

## 2019-09-02 MED ORDER — SODIUM CHLORIDE 0.9 % IV SOLN: INTRAVENOUS | Status: DC

## 2019-09-02 MED ORDER — ALBUTEROL SULFATE (2.5 MG/3ML) 0.083% IN NEBU: 2.5000 mg | INHALATION_SOLUTION | Freq: Four times a day (QID) | RESPIRATORY_TRACT | Status: DC | PRN

## 2019-09-02 MED ORDER — SODIUM CHLORIDE 0.9 % IV BOLUS
500.0000 mL | Freq: Once | INTRAVENOUS | Status: AC
  Administered 2019-09-02: 500 mL via INTRAVENOUS

## 2019-09-02 MED ORDER — CIPROFLOXACIN IN D5W 400 MG/200ML IV SOLN
400.0000 mg | Freq: Once | INTRAVENOUS | Status: AC
  Administered 2019-09-02: 400 mg via INTRAVENOUS
  Filled 2019-09-02: qty 200

## 2019-09-02 MED ORDER — DILTIAZEM HCL ER COATED BEADS 240 MG PO CP24
240.0000 mg | ORAL_CAPSULE | Freq: Every day | ORAL | Status: DC
  Administered 2019-09-02: 240 mg via ORAL
  Filled 2019-09-02 (×3): qty 1

## 2019-09-02 MED ORDER — MONTELUKAST SODIUM 10 MG PO TABS: 10.0000 mg | ORAL_TABLET | Freq: Every day | ORAL | Status: DC

## 2019-09-02 MED ORDER — FENTANYL CITRATE (PF) 100 MCG/2ML IJ SOLN
25.0000 ug | Freq: Once | INTRAMUSCULAR | Status: AC
  Administered 2019-09-02: 25 ug via INTRAVENOUS
  Filled 2019-09-02: qty 2

## 2019-09-02 MED ORDER — METRONIDAZOLE IN NACL 5-0.79 MG/ML-% IV SOLN
500.0000 mg | Freq: Three times a day (TID) | INTRAVENOUS | Status: DC
  Administered 2019-09-02: 500 mg via INTRAVENOUS
  Filled 2019-09-02: qty 100

## 2019-09-02 MED ORDER — APIXABAN 5 MG PO TABS
5.0000 mg | ORAL_TABLET | Freq: Two times a day (BID) | ORAL | Status: DC
  Administered 2019-09-02: 5 mg via ORAL
  Filled 2019-09-02: qty 1

## 2019-09-02 MED ORDER — CLONAZEPAM 0.5 MG PO TABS: 0.5000 mg | ORAL_TABLET | Freq: Two times a day (BID) | ORAL | Status: DC | PRN

## 2019-09-02 MED ORDER — ALLOPURINOL 300 MG PO TABS
300.0000 mg | ORAL_TABLET | Freq: Every day | ORAL | Status: DC
  Administered 2019-09-02: 300 mg via ORAL
  Filled 2019-09-02 (×3): qty 1

## 2019-09-02 MED ORDER — AMOXICILLIN-POT CLAVULANATE 875-125 MG PO TABS
1.0000 | ORAL_TABLET | Freq: Two times a day (BID) | ORAL | 0 refills | Status: AC
Start: 2019-09-02 — End: 2019-09-16

## 2019-09-02 MED ORDER — BUDESONIDE-FORMOTEROL FUMARATE 80-4.5 MCG/ACT IN AERO: 2.0000 | INHALATION_SPRAY | Freq: Two times a day (BID) | RESPIRATORY_TRACT | 11 refills | Status: DC

## 2019-09-02 MED ORDER — ACETAMINOPHEN 325 MG PO TABS: 650.0000 mg | ORAL_TABLET | Freq: Four times a day (QID) | ORAL | Status: DC | PRN

## 2019-09-02 MED ORDER — ATORVASTATIN CALCIUM 10 MG PO TABS: 20.0000 mg | ORAL_TABLET | Freq: Every day | ORAL | Status: DC

## 2019-09-02 MED ORDER — MOMETASONE FURO-FORMOTEROL FUM 100-5 MCG/ACT IN AERO
2.0000 | INHALATION_SPRAY | Freq: Two times a day (BID) | RESPIRATORY_TRACT | Status: DC
  Administered 2019-09-02: 2 via RESPIRATORY_TRACT
  Filled 2019-09-02: qty 8.8

## 2019-09-02 MED ORDER — IOHEXOL 300 MG/ML  SOLN
100.0000 mL | Freq: Once | INTRAMUSCULAR | Status: AC | PRN
  Administered 2019-09-02: 100 mL via INTRAVENOUS

## 2019-09-02 MED ORDER — MORPHINE SULFATE (PF) 2 MG/ML IV SOLN: 1.0000 mg | INTRAVENOUS | Status: DC | PRN

## 2019-09-02 MED ORDER — METRONIDAZOLE IN NACL 5-0.79 MG/ML-% IV SOLN
500.0000 mg | Freq: Once | INTRAVENOUS | Status: AC
  Administered 2019-09-02: 500 mg via INTRAVENOUS
  Filled 2019-09-02: qty 100

## 2019-09-02 MED ORDER — ACETAMINOPHEN 650 MG RE SUPP: 650.0000 mg | Freq: Four times a day (QID) | RECTAL | Status: DC | PRN

## 2019-09-02 MED ORDER — SODIUM CHLORIDE 0.9 % IV SOLN
2.0000 g | INTRAVENOUS | Status: DC
  Administered 2019-09-02: 2 g via INTRAVENOUS
  Filled 2019-09-02: qty 20

## 2019-09-02 MED ORDER — CIPROFLOXACIN IN D5W 400 MG/200ML IV SOLN: 400.0000 mg | Freq: Two times a day (BID) | INTRAVENOUS | Status: DC

## 2019-09-02 NOTE — ED Provider Notes (Signed)
[facility] EMERGENCY DEPARTMENT Provider Note   CSN: [id number] Arrival date & time: 09/02/19  0044     History Chief Complaint  Patient presents with  . Abdominal Pain    Jackson Martin is a 79 y.o. male.  Patient with a history of atrial fibrillation on Eliquis, COPD, hypertension, ischemic cardiomyopathy presenting with left-sided lower abdominal pain for the past 2 days.  Reports pain is constant and worse with any kind of palpation.  Pain is better when he holds still.  He denies any fevers, chills, nausea or vomiting.  States has had watery diarrhea for several weeks which is unchanged.  He was going to call Dr. Laural Golden in the morning to try to have another colonoscopy.  Denies any black or bloody stools.  States when he urinates he just dribbles.  No chest pain or shortness of breath.  No back pain.  He has never had this kind of pain before.  Previous prostatectomy  The history is provided by the patient.       Past Medical History:  Diagnosis Date  . Anxiety   . Asthmatic bronchitis   . Atrial fibrillation ([place])   . Backache   . Colon polyps   . COPD (chronic obstructive pulmonary disease) ([place])   . Esophageal reflux   . Essential hypertension   . Gout   . Hemorrhoid   . Hyperlipidemia   . Insomnia   . Ischemic heart disease   . MI (myocardial infarction) ([place]) 2003  . Neck pain   . Plantar fasciitis   . Prostate cancer [facility])     Patient Active Problem List   Diagnosis Date Noted  . Blood in stool 09/23/2017  . Dyspnea on exertion 12/25/2016  . COPD  GOLD 0/ ? AB 12/25/2016  . Gastrointestinal hemorrhage 10/29/2016    Past Surgical History:  Procedure Laterality Date  . COLONOSCOPY    . COLONOSCOPY N/A 11/15/2016   Procedure: COLONOSCOPY;  Surgeon: Rogene Houston, MD;  Location: [facility] ENDO SUITE;  Service: Endoscopy;  Laterality: N/A;  1200  . POLYPECTOMY  11/15/2016   Procedure: POLYPECTOMY;  Surgeon: Rogene Houston, MD;  Location: [facility] ENDO SUITE;   Service: Endoscopy;;  colon  . PROSTATECTOMY  2001       Family History  Problem Relation Age of Onset  . Hypertension Mother   . Heart disease Mother   . Heart disease Maternal Aunt   . Heart disease Maternal Uncle   . Heart disease Maternal Grandmother   . Heart disease Maternal Grandfather   . Colon cancer Brother   . Breast cancer Sister   . Lung cancer Sister   . Lung disease Sister     Social History   Tobacco Use  . Smoking status: Former Smoker    Packs/day: 0.75    Years: 3.00    Pack years: 2.25    Types: Cigarettes    Quit date: 03/06/1991    Years since quitting: 28.5  . Smokeless tobacco: Never Used  Vaping Use  . Vaping Use: Never used  Substance Use Topics  . Alcohol use: No  . Drug use: No    Home Medications Prior to Admission medications   Medication Sig Start Date End Date Taking? Authorizing Provider  albuterol (PROVENTIL) (2.5 MG/3ML) 0.083% nebulizer solution Take 2.5 mg by nebulization every 6 (six) hours as needed for wheezing or shortness of breath.    [provider]  allopurinol (ZYLOPRIM) 300 MG tablet Take 300  mg by mouth daily.     [provider]  apixaban (ELIQUIS) 5 MG TABS tablet Take 1 tablet (5 mg total) by mouth 2 (two) times daily. 05/19/19   Satira Sark, MD  atorvastatin (LIPITOR) 20 MG tablet Take 20 mg by mouth at bedtime.     [provider]  bisacodyl (DULCOLAX) 5 MG EC tablet Take 5 mg by mouth as needed for moderate constipation.    [provider]  budesonide-formoterol (SYMBICORT) 80-4.5 MCG/ACT inhaler Inhale 2 puffs into the lungs 2 (two) times daily. 12/25/16   Tanda Rockers, MD  citalopram (CELEXA) 20 MG tablet Take 20 mg by mouth daily with lunch.     [provider]  clonazePAM (KLONOPIN) 0.5 MG tablet Take 0.5 mg by mouth 2 (two) times daily as needed. For anxiety 10/19/16   [provider]  diltiazem (CARDIZEM CD) 240 MG 24 hr capsule Take 1 capsule by  mouth once daily 07/21/19   Satira Sark, MD  Docusate Sodium (COLACE PO) Take by mouth 2 (two) times daily.    [provider]  Elastic Bandages & Supports (Sunflower) [place] 1 each by Does not apply route as directed. 1 pair of low pressure below the knee compression stockings Dx: mild orthostatic hypotension 04/21/19   Satira Sark, MD  furosemide (LASIX) 20 MG tablet Take 20 mg by mouth daily as needed (for fluid retention.).     [provider]  hydrocortisone (ANUSOL-HC) 2.5 % rectal cream Place 1 application rectally 2 (two) times daily. 10/07/17   Rehman, Mechele Dawley, MD  Melatonin 10 MG TABS Take 1 tablet by mouth at bedtime.    [provider]  mirtazapine (REMERON) 15 MG tablet Take 15 mg by mouth at bedtime.    [provider]  montelukast (SINGULAIR) 10 MG tablet Take 10 mg by mouth at bedtime.    [provider]  polyethylene glycol (MIRALAX / GLYCOLAX) packet Take 17 g by mouth as needed for moderate constipation.    [provider]  Potassium Chloride (KLOR-CON 10 PO) Take 10 mEq by mouth daily as needed (FOR FLUID RETENTION/WITH LASIX).     [provider]  sertraline (ZOLOFT) 50 MG tablet Take 1 tablet by mouth daily. 02/20/17   [provider]  venlafaxine XR (EFFEXOR-XR) 75 MG 24 hr capsule Take 1 capsule by mouth daily. 02/13/19   [provider]  Wheat Dextrin (BENEFIBER DRINK MIX) PACK Take 4 g by mouth at bedtime. 10/07/17   Rogene Houston, MD    Allergies    Patient has no known allergies.  Review of Systems   Review of Systems  Constitutional: Negative for activity change, appetite change and fever.  HENT: Negative for congestion.   Respiratory: Negative for cough, chest tightness and shortness of breath.   Cardiovascular: Negative for chest pain.  Gastrointestinal: Positive for abdominal pain and diarrhea. Negative for nausea and vomiting.  Genitourinary:  Negative for dysuria and hematuria.  Musculoskeletal: Negative for arthralgias and myalgias.  Skin: Negative for rash.  Neurological: Negative for dizziness, weakness and headaches.   all other systems are negative except as noted in the HPI and PMH.    Physical Exam Updated Vital Signs BP 140/80   Pulse (!) 107   Temp 99.4 F (37.4 C)   Resp 18   SpO2 96%   Physical Exam Vitals and nursing note reviewed.  Constitutional:      General: He is  not in acute distress.    Appearance: Normal appearance. He is well-developed and normal weight.  HENT:     Head: Normocephalic and atraumatic.     Mouth/Throat:     Pharynx: No oropharyngeal exudate.  Eyes:     Conjunctiva/sclera: Conjunctivae normal.     Pupils: Pupils are equal, round, and reactive to light.  Neck:     Comments: No meningismus. Cardiovascular:     Rate and Rhythm: Normal rate and regular rhythm.     Heart sounds: Normal heart sounds. No murmur heard.      Comments: Equal femoral pulses bilaterally Pulmonary:     Effort: Pulmonary effort is normal. No respiratory distress.     Breath sounds: Normal breath sounds.  Chest:     Chest wall: No tenderness.  Abdominal:     Palpations: Abdomen is soft.     Tenderness: There is abdominal tenderness. There is guarding and rebound.     Comments: Very tender periumbilically left lower quadrant with voluntary guarding and rebound.  Equal femoral pulses.  Genitourinary:    Comments: Watery stool on rectal exam, no gross blood Musculoskeletal:        General: No tenderness. Normal range of motion.     Cervical back: Normal range of motion and neck supple.  Skin:    General: Skin is warm.  Neurological:     Mental Status: He is alert and oriented to person, place, and time.     Cranial Nerves: No cranial nerve deficit.     Motor: No abnormal muscle tone.     Coordination: Coordination normal.     Comments: No ataxia on finger to nose bilaterally. No pronator drift.  5/5 strength throughout. CN 2-12 intact.Equal grip strength. Sensation intact.   Psychiatric:        Behavior: Behavior normal.     ED Results / Procedures / Treatments   Labs (all labs ordered are listed, but only abnormal results are displayed) Labs Reviewed  COMPREHENSIVE METABOLIC PANEL - Abnormal; Notable for the following components:      Result Value   Potassium 3.4 (*)    Glucose, Bld 114 (*)    Calcium 8.7 (*)    Albumin 3.4 (*)    Total Bilirubin 1.3 (*)    All other components within normal limits  URINALYSIS, ROUTINE W REFLEX MICROSCOPIC - Abnormal; Notable for the following components:   Color, Urine STRAW (*)    All other components within normal limits  POC OCCULT BLOOD, ED - Abnormal; Notable for the following components:   Fecal Occult Bld POSITIVE (*)    All other components within normal limits  SARS CORONAVIRUS 2 BY RT PCR (HOSPITAL ORDER, [facility])  CBC WITH DIFFERENTIAL/PLATELET  LIPASE, BLOOD  LACTIC ACID, PLASMA    EKG None  Radiology CT ABDOMEN PELVIS W CONTRAST  Result Date: 09/02/2019 CLINICAL DATA:  Left lower quadrant abdominal pain for 2 days. Rectal bleeding. EXAM: CT ABDOMEN AND PELVIS WITH CONTRAST TECHNIQUE: Multidetector CT imaging of the abdomen and pelvis was performed using the standard protocol following bolus administration of intravenous contrast. CONTRAST:  134mL OMNIPAQUE IOHEXOL 300 MG/ML  SOLN COMPARISON:  CT 03/17/2018 FINDINGS: Lower chest: Clear lung bases.  No pleural fluid. Hepatobiliary: No focal liver abnormality is seen. No gallstones, gallbladder wall thickening, or biliary dilatation. Pancreas: Parenchymal atrophy. No ductal dilatation or inflammation. Spleen: Normal in size without focal abnormality. Adrenals/Urinary Tract: Normal adrenal glands. No hydronephrosis or perinephric  edema. Homogeneous renal enhancement with symmetric excretion on delayed phase imaging. Small subcentimeter fat density  lesion in the lower right kidney is unchanged and consistent with small angiomyolipoma. Urinary bladder is partially distended without wall thickening. Stomach/Bowel: Inflamed diverticulum in the mid sigmoid colon with pericolonic fat stranding and edema. Small amount of adjacent free fluid but no perforation or abscess. Multiple additional noninflamed diverticula. Moderate volume of colonic stool. Mild submucosal fatty infiltration of the ascending colon suggesting prior or chronic inflammation. There is mild terminal ileal diverticulosis. Normal appendix. There is no small bowel obstruction or inflammatory change. Small hiatal hernia. Stomach otherwise unremarkable. Vascular/Lymphatic: Aorto bi-iliac atherosclerosis. No aortic aneurysm. Patent portal vein. No abdominopelvic adenopathy. Reproductive: Prostatectomy. Other: Inflammatory changes minimal free fluid in the pelvis adjacent to sigmoid inflammation. No abscess or free air. Minimal fat in the inguinal canals. No ascites. Musculoskeletal: There are no acute or suspicious osseous abnormalities. Chronic avascular necrosis of the left femoral head. IMPRESSION: 1. Acute uncomplicated diverticulitis of the mid sigmoid colon. No perforation or abscess. 2. Small hiatal hernia. 3. Chronic avascular necrosis of the left femoral head. Aortic Atherosclerosis (ICD10-I70.0). Electronically Signed   By: Keith Rake M.D.   On: 09/02/2019 03:20   DG Abdomen Acute W/Chest  Result Date: 09/02/2019 CLINICAL DATA:  Left lower quadrant abdominal pain. EXAM: DG ABDOMEN ACUTE W/ 1V CHEST COMPARISON:  Abdominal CT 03/17/2018 FINDINGS: Upper normal heart size with aortic atherosclerosis and tortuosity. No focal airspace disease or pleural effusion. No pulmonary edema. Increased air throughout bowel loops in the abdomen in a nonobstructive pattern. Small volume of stool throughout the colon. There is no evidence of free intra-abdominal air. Calcification in the right pelvis  corresponds to phleboliths on CT. No acute osseous abnormalities are seen. IMPRESSION: 1. Increased air throughout small bowel loops in a nonobstructive pattern, nonspecific. No free air. 2. No acute chest findings. Aortic Atherosclerosis (ICD10-I70.0). Electronically Signed   By: Keith Rake M.D.   On: 09/02/2019 02:12    Procedures Procedures (including critical care time)  Medications Ordered in ED Medications  sodium chloride 0.9 % bolus 500 mL (has no administration in time range)    ED Course  I have reviewed the triage vital signs and the nursing notes.  Pertinent labs & imaging results that were available during my care of the patient were reviewed by me and considered in my medical decision making (see chart for details).    MDM Rules/Calculators/A&P                         Lower abdominal pain with guarding and rebound for the past 2 days.  Temperature 99.4 on arrival.  Bladder scan is 0.  Patient has intact distal pulses and femoral pulses.  Labs show no leukocytosis.  Lactate is normal. Concern for likely surgical pathology.  Acute abdominal series is negative for free air.  Labs reassuring.  Lactate is normal.  Patient given IV fluids and pain control. CT scan shows diverticulitis of the sigmoid colon without evidence of abscess or perforation.  Patient still with severe pain and nausea.  Given his advanced age as well as his CT findings, will plan admission for 24 hours of antibiotics and symptom control.  Discussed with Dr. Darrick Meigs.  Patient in agreement.  Jackson Martin was evaluated in Emergency Department on 09/02/2019 for the symptoms described in the history of present illness. He was evaluated in the context of the global COVID-19 pandemic,  which necessitated consideration that the patient might be at risk for infection with the SARS-CoV-2 virus that causes COVID-19. Institutional protocols and algorithms that pertain to the evaluation of patients at risk for  COVID-19 are in a state of rapid change based on information released by regulatory bodies including the CDC and federal and state organizations. These policies and algorithms were followed during the patient's care in the ED.  Final Clinical Impression(s) / ED Diagnoses Final diagnoses:  Diverticulitis    Rx / DC Orders ED Discharge Orders    None       Ramello Cordial, Annie Main, MD 09/02/19 [id number]

## 2019-09-02 NOTE — H&P (Signed)
TRH H&P    Patient Demographics:    Jackson Martin, is a 79 y.o. male  MRN: [id number]  DOB - 1940/04/09  Admit Date - 09/02/2019  Referring MD/NP/PA: Ezequiel Essex  Outpatient Primary MD for the patient is Monico Blitz, MD  Patient coming from: Home  Chief complaint-abdominal pain   HPI:    Jackson Martin  is a 79 y.o. male, with history of permanent atrial fibrillation on chronic anticoagulation with Eliquis, hypertension, COPD came to hospital with complaints of left-sided lower abdominal pain for past 2 days.  He denies fever chills.  Denies nausea vomiting.  Also had watery diarrhea for past 2 days.  Denies blood in the stool.  No previous history of stroke or seizures. In the ED, CT scan of the abdomen pelvis showed acute uncomplicated diverticulitis of the mid sigmoid colon.  No perforation or abscess.  Patient started on ciprofloxacin and Flagyl.    Review of systems:    In addition to the HPI above,    All other systems reviewed and are negative.    Past History of the following :    Past Medical History:  Diagnosis Date   Anxiety    Asthmatic bronchitis    Atrial fibrillation (HCC)    Backache    Colon polyps    COPD (chronic obstructive pulmonary disease) (HCC)    Esophageal reflux    Essential hypertension    Gout    Hemorrhoid    Hyperlipidemia    Insomnia    Ischemic heart disease    MI (myocardial infarction) ([facility]) 2003   Neck pain    Plantar fasciitis    Prostate cancer [facility])       Past Surgical History:  Procedure Laterality Date   COLONOSCOPY     COLONOSCOPY N/A 11/15/2016   Procedure: COLONOSCOPY;  Surgeon: Rogene Houston, MD;  Location: [facility];  Service: Endoscopy;  Laterality: N/A;  1200   POLYPECTOMY  11/15/2016   Procedure: POLYPECTOMY;  Surgeon: Rogene Houston, MD;  Location: [facility];  Service: Endoscopy;;  colon    PROSTATECTOMY  2001      Social History:      Social History   Tobacco Use   Smoking status: Former Smoker    Packs/day: 0.75    Years: 3.00    Pack years: 2.25    Types: Cigarettes    Quit date: 03/06/1991    Years since quitting: 28.5   Smokeless tobacco: Never Used  Substance Use Topics   Alcohol use: No       Family History :     Family History  Problem Relation Age of Onset   Hypertension Mother    Heart disease Mother    Heart disease Maternal Aunt    Heart disease Maternal Uncle    Heart disease Maternal Grandmother    Heart disease Maternal Grandfather    Colon cancer Brother    Breast cancer Sister    Lung cancer Sister    Lung disease Sister  Home Medications:   Prior to Admission medications   Medication Sig Start Date End Date Taking? Authorizing Provider  albuterol (PROVENTIL) (2.5 MG/3ML) 0.083% nebulizer solution Take 2.5 mg by nebulization every 6 (six) hours as needed for wheezing or shortness of breath.    [provider]  allopurinol (ZYLOPRIM) 300 MG tablet Take 300 mg by mouth daily.     [provider]  apixaban (ELIQUIS) 5 MG TABS tablet Take 1 tablet (5 mg total) by mouth 2 (two) times daily. 05/19/19   Satira Sark, MD  atorvastatin (LIPITOR) 20 MG tablet Take 20 mg by mouth at bedtime.     [provider]  bisacodyl (DULCOLAX) 5 MG EC tablet Take 5 mg by mouth as needed for moderate constipation.    [provider]  budesonide-formoterol (SYMBICORT) 80-4.5 MCG/ACT inhaler Inhale 2 puffs into the lungs 2 (two) times daily. 12/25/16   Tanda Rockers, MD  citalopram (CELEXA) 20 MG tablet Take 20 mg by mouth daily with lunch.     [provider]  clonazePAM (KLONOPIN) 0.5 MG tablet Take 0.5 mg by mouth 2 (two) times daily as needed. For anxiety 10/19/16   [provider]  diltiazem (CARDIZEM CD) 240 MG 24 hr capsule Take 1 capsule by mouth once daily 07/21/19    Satira Sark, MD  Docusate Sodium (COLACE PO) Take by mouth 2 (two) times daily.    [provider]  Elastic Bandages & Supports ([place]) [place] 1 each by Does not apply route as directed. 1 pair of low pressure below the knee compression stockings Dx: mild orthostatic hypotension 04/21/19   Satira Sark, MD  furosemide (LASIX) 20 MG tablet Take 20 mg by mouth daily as needed (for fluid retention.).     [provider]  hydrocortisone (ANUSOL-HC) 2.5 % rectal cream Place 1 application rectally 2 (two) times daily. 10/07/17   Rehman, Mechele Dawley, MD  Melatonin 10 MG TABS Take 1 tablet by mouth at bedtime.    [provider]  mirtazapine (REMERON) 15 MG tablet Take 15 mg by mouth at bedtime.    [provider]  montelukast (SINGULAIR) 10 MG tablet Take 10 mg by mouth at bedtime.    [provider]  polyethylene glycol (MIRALAX / GLYCOLAX) packet Take 17 g by mouth as needed for moderate constipation.    [provider]  Potassium Chloride (KLOR-CON 10 PO) Take 10 mEq by mouth daily as needed (FOR FLUID RETENTION/WITH LASIX).     [provider]  sertraline (ZOLOFT) 50 MG tablet Take 1 tablet by mouth daily. 02/20/17   [provider]  venlafaxine XR (EFFEXOR-XR) 75 MG 24 hr capsule Take 1 capsule by mouth daily. 02/13/19   [provider]  Wheat Dextrin (BENEFIBER DRINK MIX) PACK Take 4 g by mouth at bedtime. 10/07/17   Rogene Houston, MD     Allergies:    No Known Allergies   Physical Exam:   Vitals  Blood pressure 118/82, pulse 94, temperature 99.4 F (37.4 C), resp. rate 18, SpO2 97 %.  1.  General: Appears in no acute distress  2. Psychiatric: Alert, oriented x3, intact insight and judgment  3. Neurologic: Cranial nerves II through XII grossly intact, no focal deficit noted  4. HEENMT:  Atraumatic normocephalic, extraocular muscles are intact  5. Respiratory : Clear to  auscultation bilaterally  6. Cardiovascular : S1-S2, regular, no murmur auscultated  7. Gastrointestinal:  Abdomen is  soft, positive tenderness to palpation in left lower quadrant, mild guarding, no rigidity     Data Review:    CBC Recent Labs  Lab 09/02/19 0107  WBC 9.0  HGB 13.5  HCT 41.1  PLT 187  MCV 96.7  MCH 31.8  MCHC 32.8  RDW 13.3  LYMPHSABS 1.0  MONOABS 0.6  EOSABS 0.3  BASOSABS 0.1   ------------------------------------------------------------------------------------------------------------------  Results for orders placed or performed during the hospital encounter of 09/02/19 (from the past 48 hour(s))  CBC with Differential/Platelet     Status: None   Collection Time: 09/02/19  1:07 AM  Result Value Ref Range   WBC 9.0 4.0 - 10.5 K/uL   RBC 4.25 4.22 - 5.81 MIL/uL   Hemoglobin 13.5 13.0 - 17.0 g/dL   HCT 41.1 39 - 52 %   MCV 96.7 80.0 - 100.0 fL   MCH 31.8 26.0 - 34.0 pg   MCHC 32.8 30.0 - 36.0 g/dL   RDW 13.3 11.5 - 15.5 %   Platelets 187 150 - 400 K/uL   nRBC 0.0 0.0 - 0.2 %   Neutrophils Relative % 79 %   Neutro Abs 7.0 1.7 - 7.7 K/uL   Lymphocytes Relative 11 %   Lymphs Abs 1.0 0.7 - 4.0 K/uL   Monocytes Relative 6 %   Monocytes Absolute 0.6 0 - 1 K/uL   Eosinophils Relative 3 %   Eosinophils Absolute 0.3 0 - 0 K/uL   Basophils Relative 1 %   Basophils Absolute 0.1 0 - 0 K/uL   Immature Granulocytes 0 %   Abs Immature Granulocytes 0.02 0.00 - 0.07 K/uL    Comment: Performed at [facility], [address] [place], [place] [address]  Comprehensive metabolic panel     Status: Abnormal   Collection Time: 09/02/19  1:07 AM  Result Value Ref Range   Sodium 137 135 - 145 mmol/L   Potassium 3.4 (L) 3.5 - 5.1 mmol/L   Chloride 101 98 - 111 mmol/L   CO2 29 22 - 32 mmol/L   Glucose, Bld 114 (H) 70 - 99 mg/dL    Comment: Glucose reference range applies only to samples taken after fasting for at least 8 hours.   BUN 9 8 - 23 mg/dL   Creatinine,  Ser 1.11 0.61 - 1.24 mg/dL   Calcium 8.7 (L) 8.9 - 10.3 mg/dL   Total Protein 6.5 6.5 - 8.1 g/dL   Albumin 3.4 (L) 3.5 - 5.0 g/dL   AST 16 15 - 41 U/L   ALT 10 0 - 44 U/L   Alkaline Phosphatase 121 38 - 126 U/L   Total Bilirubin 1.3 (H) 0.3 - 1.2 mg/dL   GFR calc non Af Amer >60 >60 mL/min   GFR calc Af Amer >60 >60 mL/min   Anion gap 7 5 - 15    Comment: Performed at [facility], [address], [place], [place] [address]  Lipase, blood     Status: None   Collection Time: 09/02/19  1:07 AM  Result Value Ref Range   Lipase 21 11 - 51 U/L    Comment: Performed at [facility], [address], [place], [place] [address]  Lactic acid, plasma     Status: None   Collection Time: 09/02/19  1:07 AM  Result Value Ref Range   Lactic Acid, Venous 1.2 0.5 - 1.9 mmol/L    Comment: Performed at [facility], [address], [place], [place] [address]  POC occult blood,  ED     Status: Abnormal   Collection Time: 09/02/19  1:25 AM  Result Value Ref Range   Fecal Occult Bld POSITIVE (A) NEGATIVE  SARS Coronavirus 2 by RT PCR (hospital order, performed in [facility] hospital lab) Nasopharyngeal Nasopharyngeal Swab     Status: None   Collection Time: 09/02/19  3:25 AM   Specimen: Nasopharyngeal Swab  Result Value Ref Range   SARS Coronavirus 2 NEGATIVE NEGATIVE    Comment: (NOTE) SARS-CoV-2 target nucleic acids are NOT DETECTED.  The SARS-CoV-2 RNA is generally detectable in upper and lower respiratory specimens during the acute phase of infection. The lowest concentration of SARS-CoV-2 viral copies this assay can detect is 250 copies / mL. A negative result does not preclude SARS-CoV-2 infection and should not be used as the sole basis for treatment or other patient management decisions.  A negative result may occur with improper specimen collection / handling, submission of specimen other than nasopharyngeal swab, presence of viral mutation(s) within the areas targeted by this assay, and  inadequate number of viral copies (<250 copies / mL). A negative result must be combined with clinical observations, patient history, and epidemiological information.  Fact Sheet for Patients:   StrictlyIdeas.no  Fact Sheet for Healthcare Providers: BankingDealers.co.za  This test is not yet approved or  cleared by the [place] FDA and has been authorized for detection and/or diagnosis of SARS-CoV-2 by FDA under an Emergency Use Authorization (EUA).  This EUA will remain in effect (meaning this test can be used) for the duration of the COVID-19 declaration under Section 564(b)(1) of the Act, 21 U.S.C. section 360bbb-3(b)(1), unless the authorization is terminated or revoked sooner.  Performed at [facility], [address], [place], [place] [address]     Chemistries  Recent Labs  Lab 09/02/19 0107  NA 137  K 3.4*  CL 101  CO2 29  GLUCOSE 114*  BUN 9  CREATININE 1.11  CALCIUM 8.7*  AST 16  ALT 10  ALKPHOS 121  BILITOT 1.3*   ------------------------------------------------------------------------------------------------------------------  ------------------------------------------------------------------------------------------------------------------ GFR: CrCl cannot be calculated (Unknown ideal weight.). Liver Function Tests: Recent Labs  Lab 09/02/19 0107  AST 16  ALT 10  ALKPHOS 121  BILITOT 1.3*  PROT 6.5  ALBUMIN 3.4*   Recent Labs  Lab 09/02/19 0107  LIPASE 21    --------------------------------------------------------------------------------------------------------------- Urine analysis:    Component Value Date/Time   COLORURINE STRAW (A) 09/28/2017 0746   APPEARANCEUR CLEAR 09/28/2017 0746   LABSPEC 1.006 09/28/2017 0746   PHURINE 6.0 09/28/2017 0746   GLUCOSEU NEGATIVE 09/28/2017 0746   HGBUR NEGATIVE 09/28/2017 0746   BILIRUBINUR NEGATIVE 09/28/2017 0746   KETONESUR NEGATIVE 09/28/2017  0746   PROTEINUR 100 (A) 09/28/2017 0746   NITRITE NEGATIVE 09/28/2017 0746   LEUKOCYTESUR NEGATIVE 09/28/2017 0746      Imaging Results:    CT ABDOMEN PELVIS W CONTRAST  Result Date: 09/02/2019 CLINICAL DATA:  Left lower quadrant abdominal pain for 2 days. Rectal bleeding. EXAM: CT ABDOMEN AND PELVIS WITH CONTRAST TECHNIQUE: Multidetector CT imaging of the abdomen and pelvis was performed using the standard protocol following bolus administration of intravenous contrast. CONTRAST:  150mL OMNIPAQUE IOHEXOL 300 MG/ML  SOLN COMPARISON:  CT 03/17/2018 FINDINGS: Lower chest: Clear lung bases.  No pleural fluid. Hepatobiliary: No focal liver abnormality is seen. No gallstones, gallbladder wall thickening, or biliary dilatation. Pancreas: Parenchymal atrophy. No ductal dilatation or inflammation. Spleen: Normal in size without focal abnormality. Adrenals/Urinary Tract: Normal adrenal glands. No  hydronephrosis or perinephric edema. Homogeneous renal enhancement with symmetric excretion on delayed phase imaging. Small subcentimeter fat density lesion in the lower right kidney is unchanged and consistent with small angiomyolipoma. Urinary bladder is partially distended without wall thickening. Stomach/Bowel: Inflamed diverticulum in the mid sigmoid colon with pericolonic fat stranding and edema. Small amount of adjacent free fluid but no perforation or abscess. Multiple additional noninflamed diverticula. Moderate volume of colonic stool. Mild submucosal fatty infiltration of the ascending colon suggesting prior or chronic inflammation. There is mild terminal ileal diverticulosis. Normal appendix. There is no small bowel obstruction or inflammatory change. Small hiatal hernia. Stomach otherwise unremarkable. Vascular/Lymphatic: Aorto bi-iliac atherosclerosis. No aortic aneurysm. Patent portal vein. No abdominopelvic adenopathy. Reproductive: Prostatectomy. Other: Inflammatory changes minimal free fluid in the  pelvis adjacent to sigmoid inflammation. No abscess or free air. Minimal fat in the inguinal canals. No ascites. Musculoskeletal: There are no acute or suspicious osseous abnormalities. Chronic avascular necrosis of the left femoral head. IMPRESSION: 1. Acute uncomplicated diverticulitis of the mid sigmoid colon. No perforation or abscess. 2. Small hiatal hernia. 3. Chronic avascular necrosis of the left femoral head. Aortic Atherosclerosis (ICD10-I70.0). Electronically Signed   By: Keith Rake M.D.   On: 09/02/2019 03:20   DG Abdomen Acute W/Chest  Result Date: 09/02/2019 CLINICAL DATA:  Left lower quadrant abdominal pain. EXAM: DG ABDOMEN ACUTE W/ 1V CHEST COMPARISON:  Abdominal CT 03/17/2018 FINDINGS: Upper normal heart size with aortic atherosclerosis and tortuosity. No focal airspace disease or pleural effusion. No pulmonary edema. Increased air throughout bowel loops in the abdomen in a nonobstructive pattern. Small volume of stool throughout the colon. There is no evidence of free intra-abdominal air. Calcification in the right pelvis corresponds to phleboliths on CT. No acute osseous abnormalities are seen. IMPRESSION: 1. Increased air throughout small bowel loops in a nonobstructive pattern, nonspecific. No free air. 2. No acute chest findings. Aortic Atherosclerosis (ICD10-I70.0). Electronically Signed   By: Keith Rake M.D.   On: 09/02/2019 02:12       Assessment & Plan:    Active Problems:   Diverticulitis   1. Diverticulitis-we will start patient on Cipro and Flagyl.  Keep NPO.  Start IV normal saline at 75 mL/h.  Once patient is clinically better consider starting him on clear liquid diet and advance as tolerated. 2. Chronic atrial fibrillation- CHA2DS2VASc score is 4, continue anticoagulation with Eliquis.  Heart rate is controlled with Cardizem.  We will continue with Cardizem. 3. Hyperlipidemia-continue Lipitor 4. COPD-no acute exacerbation, continue Symbicort   DVT  Prophylaxis-   Eliquis  AM Labs Ordered, also please review Full Orders  Family Communication: Admission, patients condition and plan of care including tests being ordered have been discussed with the patient  who indicate understanding and agree with the plan and Code Status.  Code Status: DNR  Admission status: Observation  Time spent in minutes : 60 minutes   Anmol Fleck S Nami Strawder M.D

## 2019-09-02 NOTE — Discharge Instructions (Signed)
Diverticulitis  Diverticulitis is when small pockets in your large intestine (colon) get infected or swollen. This causes stomach pain and watery poop (diarrhea). These pouches are called diverticula. They form in people who have Amad Mau condition called diverticulosis. Follow these instructions at home: Medicines  Take over-the-counter and prescription medicines only as told by your doctor. These include: ? Antibiotics. ? Pain medicines. ? Fiber pills. ? Probiotics. ? Stool softeners.  Do not drive or use heavy machinery while taking prescription pain medicine.  If you were prescribed an antibiotic, take it as told. Do not stop taking it even if you feel better. General instructions   Follow Khalila Buechner diet as told by your doctor.  When you feel better, your doctor may tell you to change your diet. You may need to eat Lamyia Cdebaca lot of fiber. Fiber makes it easier to poop (have bowel movements). Healthy foods with fiber include: ? Berries. ? Beans. ? Lentils. ? Green vegetables.  Exercise 3 or more times Eual Lindstrom week. Aim for 30 minutes each time. Exercise enough to sweat and make your heart beat faster.  Keep all follow-up visits as told. This is important. You may need to have an exam of the large intestine. This is called Kendi Defalco colonoscopy. Contact Ninoshka Wainwright doctor if:  Your pain does not get better.  You have Brendaly Townsel hard time eating or drinking.  You are not pooping like normal. Get help right away if:  Your pain gets worse.  Your problems do not get better.  Your problems get worse very fast.  You have Julio Storr fever.  You throw up (vomit) more than one time.  You have poop that is: ? Bloody. ? Black. ? Tarry. Summary  Diverticulitis is when small pockets in your large intestine (colon) get infected or swollen.  Take medicines only as told by your doctor.  Follow Terence Googe diet as told by your doctor. This information is not intended to replace advice given to you by your health care provider. Make sure you  discuss any questions you have with your health care provider. Document Revised: 02/01/2017 Document Reviewed: 03/08/2016 Elsevier Patient Education  2020 [employer]  

## 2019-09-02 NOTE — Discharge Summary (Signed)
Physician Discharge Summary  Jackson Martin QJF:[id number] DOB: 10-Apr-1940 DOA: 09/02/2019  PCP: Monico Blitz, MD  Admit date: 09/02/2019 Discharge date: 09/02/2019  Time spent: 40 minutes  Recommendations for Outpatient Follow-up:  1. Follow outpatient CBC/CMP 2. Complete course of abx outpatient 3. Follow diverticulitis with outpatient provider - consider discussion regarding need for colonoscopy   4. Follow avascular necrosis of L femoral head outpatient  Discharge Diagnoses:  Active Problems:   Diverticulitis   Discharge Condition: stable  Diet recommendation: soft diet  There were no vitals filed for this visit.  History of present illness:  Jackson Martin  is Jackson Martin 79 y.o. male, with history of permanent atrial fibrillation on chronic anticoagulation with Eliquis, hypertension, COPD came to hospital with complaints of left-sided lower abdominal pain for past 2 days.  He denies fever chills.  Denies nausea vomiting.  Also had watery diarrhea for past 2 days.  Denies blood in the stool.  No previous history of stroke or seizures. In the ED, CT scan of the abdomen pelvis showed acute uncomplicated diverticulitis of the mid sigmoid colon.  No perforation or abscess.  Patient started on ciprofloxacin and Flagyl.  He was admitted for diverticulitis, but improved on the morning of admission.  He was started on antibiotics and tolerated Jackson Martin soft diet for lunch.  He was discharged with PO antibiotics and given instructions to follow outpatient with his PCP.  See below for additional details  Hospital Course:  1. Diverticulitis- 1. Discharged on augmentin - f/u with PCP outpatient, discuss whether to pursue colonoscopy   2. Chronic atrial fibrillation- CHA2DS2VASc score is 4, continue anticoagulation with Eliquis.  Heart rate is controlled with Cardizem.  We will continue with Cardizem.  3. Hyperlipidemia-continue Lipitor  4. COPD-no acute exacerbation, continue  Symbicort  Procedures: none Consultations:  none  Discharge Exam: Vitals:   09/02/19 1439 09/02/19 1440  BP:    Pulse: 81   Resp:    Temp:  98 F (36.7 C)  SpO2: 96%    Feeling better Asking to go home  General: No acute distress. Cardiovascular: Heart sounds show Jackson Martin regular rate, and rhythm. Lungs: Clear to auscultation bilaterally Abdomen: Soft, mild TTP in lower quadrants, nondistended Neurological: Alert and oriented 3. Moves all extremities 4 with equal strength. Cranial nerves II through XII grossly intact. Skin: Warm and dry. No rashes or lesions. Extremities: No clubbing or cyanosis. No edema  Discharge Instructions   Discharge Instructions    Call MD for:  difficulty breathing, headache or visual disturbances   Complete by: As directed    Call MD for:  extreme fatigue   Complete by: As directed    Call MD for:  hives   Complete by: As directed    Call MD for:  persistant dizziness or light-headedness   Complete by: As directed    Call MD for:  persistant nausea and vomiting   Complete by: As directed    Call MD for:  redness, tenderness, or signs of infection (pain, swelling, redness, odor or green/yellow discharge around incision site)   Complete by: As directed    Call MD for:  severe uncontrolled pain   Complete by: As directed    Call MD for:  temperature >100.4   Complete by: As directed    Diet - low sodium heart healthy   Complete by: As directed    Discharge instructions   Complete by: As directed    You were seen for abdominal pain.  Your CT scan showed diverticulitis.  You've improved with antibiotics, so we'll discharge you home with oral antibiotics.  Take augmentin twice daily for the next 14 days.  Continue to eat Jackson Martin soft diet as you go home until you feel back to normal.  After you've improved, you can transition to Jackson Martin high fiber diet for your diverticular disease.  Please follow up with your primary care doctor within the next week.  If  your abdominal pain returns or you have fever or any other worsening or recurrent or new symptoms, please return to the ED for care.  Please ask your PCP to request records from this hospitalization so they know what was done and what the next steps will be.   Increase activity slowly   Complete by: As directed      Allergies as of 09/02/2019   No Known Allergies     Medication List    TAKE these medications   albuterol (2.5 MG/3ML) 0.083% nebulizer solution Commonly known as: PROVENTIL Take 2.5 mg by nebulization every 6 (six) hours as needed for wheezing or shortness of breath.   allopurinol 300 MG tablet Commonly known as: ZYLOPRIM Take 300 mg by mouth daily.   amoxicillin-clavulanate 875-125 MG tablet Commonly known as: Augmentin Take 1 tablet by mouth 2 (two) times daily for 14 days.   apixaban 5 MG Tabs tablet Commonly known as: Eliquis Take 1 tablet (5 mg total) by mouth 2 (two) times daily.   atorvastatin 20 MG tablet Commonly known as: LIPITOR Take 20 mg by mouth at bedtime.   budesonide-formoterol 80-4.5 MCG/ACT inhaler Commonly known as: Symbicort Inhale 2 puffs into the lungs 2 (two) times daily.   clonazePAM 0.5 MG tablet Commonly known as: KLONOPIN Take 0.5 mg by mouth 3 (three) times daily. For anxiety   diltiazem 240 MG 24 hr capsule Commonly known as: CARDIZEM CD Take 1 capsule by mouth once daily   Medical Compression Stockings Misc 1 each by Does not apply route as directed. 1 pair of low pressure below the knee compression stockings Dx: mild orthostatic hypotension   Melatonin 10 MG Tabs Take 1 tablet by mouth at bedtime.   montelukast 10 MG tablet Commonly known as: SINGULAIR Take 10 mg by mouth at bedtime.      No Known Allergies    The results of significant diagnostics from this hospitalization (including imaging, microbiology, ancillary and laboratory) are listed below for reference.    Significant Diagnostic Studies: CT  ABDOMEN PELVIS W CONTRAST  Result Date: 09/02/2019 CLINICAL DATA:  Left lower quadrant abdominal pain for 2 days. Rectal bleeding. EXAM: CT ABDOMEN AND PELVIS WITH CONTRAST TECHNIQUE: Multidetector CT imaging of the abdomen and pelvis was performed using the standard protocol following bolus administration of intravenous contrast. CONTRAST:  138mL OMNIPAQUE IOHEXOL 300 MG/ML  SOLN COMPARISON:  CT 03/17/2018 FINDINGS: Lower chest: Clear lung bases.  No pleural fluid. Hepatobiliary: No focal liver abnormality is seen. No gallstones, gallbladder wall thickening, or biliary dilatation. Pancreas: Parenchymal atrophy. No ductal dilatation or inflammation. Spleen: Normal in size without focal abnormality. Adrenals/Urinary Tract: Normal adrenal glands. No hydronephrosis or perinephric edema. Homogeneous renal enhancement with symmetric excretion on delayed phase imaging. Small subcentimeter fat density lesion in the lower right kidney is unchanged and consistent with small angiomyolipoma. Urinary bladder is partially distended without wall thickening. Stomach/Bowel: Inflamed diverticulum in the mid sigmoid colon with pericolonic fat stranding and edema. Small amount of adjacent free fluid but no perforation or abscess. Multiple  additional noninflamed diverticula. Moderate volume of colonic stool. Mild submucosal fatty infiltration of the ascending colon suggesting prior or chronic inflammation. There is mild terminal ileal diverticulosis. Normal appendix. There is no small bowel obstruction or inflammatory change. Small hiatal hernia. Stomach otherwise unremarkable. Vascular/Lymphatic: Aorto bi-iliac atherosclerosis. No aortic aneurysm. Patent portal vein. No abdominopelvic adenopathy. Reproductive: Prostatectomy. Other: Inflammatory changes minimal free fluid in the pelvis adjacent to sigmoid inflammation. No abscess or free air. Minimal fat in the inguinal canals. No ascites. Musculoskeletal: There are no acute or  suspicious osseous abnormalities. Chronic avascular necrosis of the left femoral head. IMPRESSION: 1. Acute uncomplicated diverticulitis of the mid sigmoid colon. No perforation or abscess. 2. Small hiatal hernia. 3. Chronic avascular necrosis of the left femoral head. Aortic Atherosclerosis (ICD10-I70.0). Electronically Signed   By: Keith Rake M.D.   On: 09/02/2019 03:20   DG Abdomen Acute W/Chest  Result Date: 09/02/2019 CLINICAL DATA:  Left lower quadrant abdominal pain. EXAM: DG ABDOMEN ACUTE W/ 1V CHEST COMPARISON:  Abdominal CT 03/17/2018 FINDINGS: Upper normal heart size with aortic atherosclerosis and tortuosity. No focal airspace disease or pleural effusion. No pulmonary edema. Increased air throughout bowel loops in the abdomen in Carole Deere nonobstructive pattern. Small volume of stool throughout the colon. There is no evidence of free intra-abdominal air. Calcification in the right pelvis corresponds to phleboliths on CT. No acute osseous abnormalities are seen. IMPRESSION: 1. Increased air throughout small bowel loops in Annice Jolly nonobstructive pattern, nonspecific. No free air. 2. No acute chest findings. Aortic Atherosclerosis (ICD10-I70.0). Electronically Signed   By: Keith Rake M.D.   On: 09/02/2019 02:12    Microbiology: Recent Results (from the past 240 hour(s))  SARS Coronavirus 2 by RT PCR (hospital order, performed in [facility] hospital lab) Nasopharyngeal Nasopharyngeal Swab     Status: None   Collection Time: 09/02/19  3:25 AM   Specimen: Nasopharyngeal Swab  Result Value Ref Range Status   SARS Coronavirus 2 NEGATIVE NEGATIVE Final    Comment: (NOTE) SARS-CoV-2 target nucleic acids are NOT DETECTED.  The SARS-CoV-2 RNA is generally detectable in upper and lower respiratory specimens during the acute phase of infection. The lowest concentration of SARS-CoV-2 viral copies this assay can detect is 250 copies / mL. Kennidee Heyne negative result does not preclude SARS-CoV-2 infection and  should not be used as the sole basis for treatment or other patient management decisions.  Paydon Carll negative result may occur with improper specimen collection / handling, submission of specimen other than nasopharyngeal swab, presence of viral mutation(s) within the areas targeted by this assay, and inadequate number of viral copies (<250 copies / mL). Harim Bi negative result must be combined with clinical observations, patient history, and epidemiological information.  Fact Sheet for Patients:   StrictlyIdeas.no  Fact Sheet for Healthcare Providers: BankingDealers.co.za  This test is not yet approved or  cleared by the [place] FDA and has been authorized for detection and/or diagnosis of SARS-CoV-2 by FDA under an Emergency Use Authorization (EUA).  This EUA will remain in effect (meaning this test can be used) for the duration of the COVID-19 declaration under Section 564(b)(1) of the Act, 21 U.S.C. section 360bbb-3(b)(1), unless the authorization is terminated or revoked sooner.  Performed at [facility], [address] [place], [place] [address]      Labs: Basic Metabolic Panel: Recent Labs  Lab 09/02/19 0107  NA 137  K 3.4*  CL 101  CO2 29  GLUCOSE 114*  BUN 9  CREATININE 1.11  CALCIUM 8.7*   Liver Function Tests: Recent Labs  Lab 09/02/19 0107  AST 16  ALT 10  ALKPHOS 121  BILITOT 1.3*  PROT 6.5  ALBUMIN 3.4*   Recent Labs  Lab 09/02/19 0107  LIPASE 21   No results for input(s): AMMONIA in the last 168 hours. CBC: Recent Labs  Lab 09/02/19 0107  WBC 9.0  NEUTROABS 7.0  HGB 13.5  HCT 41.1  MCV 96.7  PLT 187   Cardiac Enzymes: No results for input(s): CKTOTAL, CKMB, CKMBINDEX, TROPONINI in the last 168 hours. BNP: BNP (last 3 results) No results for input(s): BNP in the last 8760 hours.  ProBNP (last 3 results) No results for input(s): PROBNP in the last 8760 hours.  CBG: No results for  input(s): GLUCAP in the last 168 hours.     Signed:  Fayrene Helper MD.  [facility] 09/02/2019, 6:36 PM

## 2019-09-02 NOTE — Evaluation (Signed)
Physical Therapy Evaluation Patient Details Name: Jackson Martin MRN: [id number] DOB: 09/06/1940 Today's Date: 09/02/2019   History of Present Illness  Jackson Martin  is a 79 y.o. male, with history of permanent atrial fibrillation on chronic anticoagulation with Eliquis, hypertension, COPD came to hospital with complaints of left-sided lower abdominal pain for past 2 days.  He denies fever chills.  Denies nausea vomiting.  Also had watery diarrhea for past 2 days.  Denies blood in the stool.  No previous history of stroke or seizures.    Clinical Impression  Patient functioning at baseline for functional mobility and gait.  Patient demonstrates good return for transferring to commode in bathroom without assistance and ambulation in room/hallways without loss of balance.  Plan:  Patient discharged from physical therapy to care of nursing for ambulation daily as tolerated for length of stay.     Follow Up Recommendations No PT follow up    Equipment Recommendations  None recommended by PT    Recommendations for Other Services       Precautions / Restrictions Precautions Precautions: None Restrictions Weight Bearing Restrictions: No      Mobility  Bed Mobility Overal bed mobility: Independent                Transfers Overall transfer level: Independent                  Ambulation/Gait Ambulation/Gait assistance: Modified independent (Device/Increase time) Gait Distance (Feet): 100 Feet Assistive device: None Gait Pattern/deviations: WFL(Within Functional Limits) Gait velocity: slightly decreased   General Gait Details: good return for ambulation in room and hallways without loss of balance  Stairs            Wheelchair Mobility    Modified Rankin (Stroke Patients Only)       Balance Overall balance assessment: Modified Independent                                           Pertinent Vitals/Pain Pain Assessment:  0-10 Pain Score: 4  Pain Location: lower abdomen Pain Descriptors / Indicators: Sore Pain Intervention(s): Limited activity within patient's tolerance;Monitored during session    Home Living Family/patient expects to be discharged to:: Private residence Living Arrangements: Alone Available Help at Discharge: Family;Available 24 hours/day Type of Home: House Home Access: Ramped entrance     Home Layout: One level Home Equipment: Walker - 2 wheels;Cane - single point;Bedside commode;Shower seat;Wheelchair - manual      Prior Function Level of Independence: Independent         Comments: Hydrographic surveyor, drives     Journalist, newspaper        Extremity/Trunk Assessment   Upper Extremity Assessment Upper Extremity Assessment: Overall WFL for tasks assessed    Lower Extremity Assessment Lower Extremity Assessment: Overall WFL for tasks assessed    Cervical / Trunk Assessment Cervical / Trunk Assessment: Normal  Communication   Communication: No difficulties  Cognition Arousal/Alertness: Awake/alert Behavior During Therapy: WFL for tasks assessed/performed Overall Cognitive Status: Within Functional Limits for tasks assessed                                        General Comments      Exercises     Assessment/Plan    PT  Assessment Patent does not need any further PT services  PT Problem List         PT Treatment Interventions      PT Goals (Current goals can be found in the Care Plan section)  Acute Rehab PT Goals Patient Stated Goal: return home with family to assist PT Goal Formulation: With patient Time For Goal Achievement: 09/02/19 Potential to Achieve Goals: Good    Frequency     Barriers to discharge        Co-evaluation               AM-PAC PT "6 Clicks" Mobility  Outcome Measure Help needed turning from your back to your side while in a flat bed without using bedrails?: None Help needed moving from lying on your  back to sitting on the side of a flat bed without using bedrails?: None Help needed moving to and from a bed to a chair (including a wheelchair)?: None Help needed standing up from a chair using your arms (e.g., wheelchair or bedside chair)?: None Help needed to walk in hospital room?: None Help needed climbing 3-5 steps with a railing? : None 6 Click Score: 24    End of Session   Activity Tolerance: Patient tolerated treatment well Patient left: in bed;with call bell/phone within reach Nurse Communication: Mobility status PT Visit Diagnosis: Unsteadiness on feet (R26.81);Other abnormalities of gait and mobility (R26.89);Muscle weakness (generalized) (M62.81)    Time: 5188-4166 PT Time Calculation (min) (ACUTE ONLY): 23 min   Charges:   PT Evaluation $PT Eval Moderate Complexity: 1 Mod PT Treatments $Therapeutic Activity: 23-37 mins        12:23 PM, 09/02/19 Jackson Martin, MPT Physical Therapist with [facility] [phone] office [phone] mobile phone

## 2019-09-02 NOTE — ED Triage Notes (Signed)
Pt c/o lower abd pain x 2 days, he states when he urinates he just dribbles.

## 2019-09-21 DIAGNOSIS — D6869 Other thrombophilia: Secondary | ICD-10-CM | POA: Diagnosis not present

## 2019-09-21 DIAGNOSIS — F331 Major depressive disorder, recurrent, moderate: Secondary | ICD-10-CM | POA: Diagnosis not present

## 2019-09-21 DIAGNOSIS — Z299 Encounter for prophylactic measures, unspecified: Secondary | ICD-10-CM | POA: Diagnosis not present

## 2019-09-21 DIAGNOSIS — I4891 Unspecified atrial fibrillation: Secondary | ICD-10-CM | POA: Diagnosis not present

## 2019-09-21 DIAGNOSIS — J449 Chronic obstructive pulmonary disease, unspecified: Secondary | ICD-10-CM | POA: Diagnosis not present

## 2019-09-21 DIAGNOSIS — I10 Essential (primary) hypertension: Secondary | ICD-10-CM | POA: Diagnosis not present

## 2019-10-05 DIAGNOSIS — Z0000 Encounter for general adult medical examination without abnormal findings: Secondary | ICD-10-CM | POA: Diagnosis not present

## 2019-10-05 DIAGNOSIS — Z299 Encounter for prophylactic measures, unspecified: Secondary | ICD-10-CM | POA: Diagnosis not present

## 2019-10-05 DIAGNOSIS — Z7189 Other specified counseling: Secondary | ICD-10-CM | POA: Diagnosis not present

## 2019-10-05 DIAGNOSIS — Z79899 Other long term (current) drug therapy: Secondary | ICD-10-CM | POA: Diagnosis not present

## 2019-10-05 DIAGNOSIS — Z6823 Body mass index (BMI) 23.0-23.9, adult: Secondary | ICD-10-CM | POA: Diagnosis not present

## 2019-10-05 DIAGNOSIS — R5383 Other fatigue: Secondary | ICD-10-CM | POA: Diagnosis not present

## 2019-10-05 DIAGNOSIS — I10 Essential (primary) hypertension: Secondary | ICD-10-CM | POA: Diagnosis not present

## 2019-10-05 DIAGNOSIS — Z125 Encounter for screening for malignant neoplasm of prostate: Secondary | ICD-10-CM | POA: Diagnosis not present

## 2019-10-05 DIAGNOSIS — Z1339 Encounter for screening examination for other mental health and behavioral disorders: Secondary | ICD-10-CM | POA: Diagnosis not present

## 2019-10-05 DIAGNOSIS — E7800 Pure hypercholesterolemia, unspecified: Secondary | ICD-10-CM | POA: Diagnosis not present

## 2019-10-05 DIAGNOSIS — Z1331 Encounter for screening for depression: Secondary | ICD-10-CM | POA: Diagnosis not present

## 2019-10-29 ENCOUNTER — Encounter (INDEPENDENT_AMBULATORY_CARE_PROVIDER_SITE_OTHER): Payer: Self-pay | Admitting: *Deleted

## 2019-11-20 ENCOUNTER — Ambulatory Visit: Payer: Medicare PPO | Admitting: Cardiology

## 2019-11-20 ENCOUNTER — Encounter: Payer: Self-pay | Admitting: Cardiology

## 2019-11-20 VITALS — BP 112/68 | HR 90 | Ht 69.0 in | Wt 165.0 lb

## 2019-11-20 DIAGNOSIS — I4819 Other persistent atrial fibrillation: Secondary | ICD-10-CM

## 2019-11-20 DIAGNOSIS — I10 Essential (primary) hypertension: Secondary | ICD-10-CM

## 2019-11-20 NOTE — Patient Instructions (Addendum)

## 2019-11-20 NOTE — Progress Notes (Signed)
Cardiology Office Note  Date: 11/20/2019   ID: Jackson Martin, DOB 1940/07/31, MRN [id number]  PCP:  Monico Blitz, MD  Cardiologist:  Rozann Lesches, MD Electrophysiologist:  None   Chief Complaint  Patient presents with  . Cardiac follow-up    History of Present Illness: Jackson Martin is an 79 y.o. male last assessed via telehealth encounter in January.  He is here for a routine visit.  From a cardiac perspective, he reports no active palpitations.  He has not had any spontaneous bleeding problems on Eliquis, does bruise occasionally.  Records indicate hospitalization in June with diverticulitis.  I reviewed his lab work from that time.  I reviewed his medication list, he reports compliance and no obvious intolerances.  Past Medical History:  Diagnosis Date  . Anxiety   . Asthmatic bronchitis   . Atrial fibrillation ([place])   . Backache   . Colon polyps   . COPD (chronic obstructive pulmonary disease) ([place])   . Esophageal reflux   . Essential hypertension   . Gout   . Hemorrhoid   . Hyperlipidemia   . Insomnia   . Ischemic heart disease   . MI (myocardial infarction) ([place]) 2003  . Neck pain   . Plantar fasciitis   . Prostate cancer [facility])     Past Surgical History:  Procedure Laterality Date  . COLONOSCOPY    . COLONOSCOPY N/A 11/15/2016   Procedure: COLONOSCOPY;  Surgeon: Rogene Houston, MD;  Location: [facility] SU[facility];  Service: Endoscopy;  Laterality: N/A;  1200  . POLYPECTOMY  11/15/2016   Procedure: POLYPECTOMY;  Surgeon: Rogene Houston, MD;  Location: [facility]ITE;  Service: Endoscopy;;  colon  . PROSTATECTOMY  2001    Current Outpatient Medications  Medication Sig Dispense Refill  . albuterol (PROVENTIL) (2.5 MG/3ML) 0.083% nebulizer solution Take 2.5 mg by nebulization every 6 (six) hours as needed for wheezing or shortness of breath.    . allopurinol (ZYLOPRIM) 300 MG tablet Take 300 mg by mouth daily.     Marland Kitchen apixaban (ELIQUIS) 5 MG TABS tablet  Take 1 tablet (5 mg total) by mouth 2 (two) times daily. 180 tablet 2  . atorvastatin (LIPITOR) 20 MG tablet Take 20 mg by mouth at bedtime.     . budesonide-formoterol (SYMBICORT) 80-4.5 MCG/ACT inhaler Inhale 2 puffs into the lungs 2 (two) times daily. 1 Inhaler 11  . clonazePAM (KLONOPIN) 0.5 MG tablet Take 0.5 mg by mouth 3 (three) times daily. For anxiety    . diltiazem (CARDIZEM CD) 240 MG 24 hr capsule Take 1 capsule by mouth once daily 90 capsule 1  . Elastic Bandages & Supports (MEDICAL COMPRESSION STOCKINGS) MISC 1 each by Does not apply route as directed. 1 pair of low pressure below the knee compression stockings Dx: mild orthostatic hypotension 2 each 0  . Melatonin 10 MG TABS Take 1 tablet by mouth at bedtime.    . montelukast (SINGULAIR) 10 MG tablet Take 10 mg by mouth at bedtime.     No current facility-administered medications for this visit.   Allergies:  Patient has no known allergies.   ROS:  Hearing loss.  Physical Exam: VS:  BP 112/68   Pulse 90   Ht 5\' 9"  (1.753 m)   Wt 165 lb (74.8 kg)   SpO2 98%   BMI 24.37 kg/m , BMI Body mass index is 24.37 kg/m.  Wt Readings from Last 3 Encounters:  11/20/19 165 lb (74.8 kg)  04/21/19 173 lb 6.4 oz (78.7 kg)  03/25/19 179 lb (81.2 kg)    General: Patient appears comfortable at rest. HEENT: Conjunctiva and lids normal, wearing a mask. Neck: Supple, no elevated JVP or carotid bruits, no thyromegaly. Lungs: Clear to auscultation, nonlabored breathing at rest. Cardiac: Irregularly irregular, soft systolic murmur. Extremities: No pitting edema, distal pulses 2+.  ECG:  An ECG dated 04/21/2019 was personally reviewed today and demonstrated:  Atrial fibrillation with right bundle branch block and PVC.  Recent Labwork: 09/02/2019: ALT 10; AST 16; BUN 9; Creatinine, Ser 1.11; Hemoglobin 13.5; Platelets 187; Potassium 3.4; Sodium 137   Other Studies Reviewed Today:  Echocardiogram 08/06/2016 [facility]): Normal LV wall thickness with LVEF 60-65%, normal right ventricular contraction, mild left atrial enlargement, mildly sclerotic aortic valve without stenosis, mild mitral regurgitation, mild tricuspid regurgitation, no pericardial effusion.  Assessment and Plan:  1.  Permanent atrial fibrillation.  CHA2DS2-VASc score is 4.  He does not report any significant palpitations and continues on Cartia XT for heart rate control, Eliquis for stroke prophylaxis.  I reviewed his lab work from June.  No changes were made today.  2.  Essential hypertension, blood pressure low normal today.  Continue observation.  Medication Adjustments/Labs and Tests Ordered: Current medicines are reviewed at length with the patient today.  Concerns regarding medicines are outlined above.   Tests Ordered: No orders of the defined types were placed in this encounter.   Medication Changes: No orders of the defined types were placed in this encounter.   Disposition:  Follow up 6 months in the [place] office.  Signed, Satira Sark, MD, [facility] 11/20/2019 4:04 PM    [facility] [place], [place], [place] [address] Phone: [phone]; Fax: [phone]

## 2019-12-04 DIAGNOSIS — K5792 Diverticulitis of intestine, part unspecified, without perforation or abscess without bleeding: Secondary | ICD-10-CM

## 2019-12-04 HISTORY — DX: Diverticulitis of intestine, part unspecified, without perforation or abscess without bleeding: K57.92

## 2020-01-05 ENCOUNTER — Telehealth (INDEPENDENT_AMBULATORY_CARE_PROVIDER_SITE_OTHER): Payer: Self-pay | Admitting: *Deleted

## 2020-01-05 ENCOUNTER — Other Ambulatory Visit (INDEPENDENT_AMBULATORY_CARE_PROVIDER_SITE_OTHER): Payer: Self-pay | Admitting: *Deleted

## 2020-01-05 ENCOUNTER — Encounter (INDEPENDENT_AMBULATORY_CARE_PROVIDER_SITE_OTHER): Payer: Self-pay | Admitting: *Deleted

## 2020-01-05 NOTE — Telephone Encounter (Signed)
Referring MD/PCP: shah   Procedure: tcs  Reason/Indication:  Hx polyps  Has patient had this procedure before?  Yes, 2018  If so, when, by whom and where?    Is there a family history of colon cancer?  no  Who?  What age when diagnosed?    Is patient diabetic?   no      Does patient have prosthetic heart valve or mechanical valve?  no  Do you have a pacemaker/defibrillator?  no  Has patient ever had endocarditis/atrial fibrillation? no  Does patient use oxygen? no  Has patient had joint replacement within last 12 months?  no  Is patient constipated or do they take laxatives? no  Does patient have a history of alcohol/drug use?  no  Is patient on blood thinner such as Coumadin, Plavix and/or Aspirin? yes  Medications: eliquis 5 mg bid, albuterol daily, allopurinol 300 mg daily, atorvastatin 20 mg daily, budesonide bid, citalopram 20 mg daily, clonazepam 0.5 mg bid, diltiazem 240 mg daily, furosemide 20 mg prn, ansol prn, klor con 10 meq daily, melatonin at bedtime, mirtazapine 15 mg daily, montelukast 10 mg daily, sertraline 50 mg daily  Allergies: nkda  Medication Adjustment per Dr Rehman/Dr Jenetta Downer eliquis 2 days before  Procedure date & time: 01/20/20

## 2020-01-05 NOTE — Telephone Encounter (Signed)
01/05/20  Carla Drape 1940-04-25   Request for pharmacy clearance to hold medication prior to procedure:    Procedure to be performed? clonoscopy   Procedure Date: 01/20/20    Medications that need to be held prior to procedure and how long?   Eliquis 2 days prior to procedure    Name of physician performing procedure? Dr Laural Golden     Anesthesia type? Monitored (propofol)   ______ Yes, the above patient can stop the medicine indicated above   ______ No, the above cannot stop the medicine indicated above    ______________________________     ______________________         Doctor Signature             Date   Please fax this back at [phone]. If you have questions I can be reached at [phone].  Thank you  [facility]

## 2020-01-05 NOTE — Telephone Encounter (Signed)
Patient with diagnosis of afib on Eliquis for anticoagulation.    Procedure: clonoscopy Date of procedure: 01/20/20    CHA2DS2-VASc Score = 4  This indicates a 4.8% annual risk of stroke. The patient's score is based upon: CHF History: 0 HTN History: 1 Diabetes History: 0 Stroke History: 0 Vascular Disease History: 1 Age Score: 2 Gender Score: 0      CrCl 52 ml/min  Per office protocol, patient can hold Eliquis for 2 days prior to procedure.

## 2020-01-05 NOTE — Telephone Encounter (Signed)
Patient needs trilyte 

## 2020-01-05 NOTE — Telephone Encounter (Signed)
Clinical pharmacist to review Eliquis 

## 2020-01-06 NOTE — Telephone Encounter (Signed)
I have called and gave the patient instruction regarding the Eliquis. He will hold Eliquis for 2 days prior to the procedure and restart as soon as possible after the procedure at the surgeon's discretion.   He seems to be confused about bowel prep and insurance coverage for this procedure, I asked him to call Dr. Olevia Perches office if he still doesn't receive any instruction a week before the procedure.

## 2020-01-07 MED ORDER — PEG 3350-KCL-NA BICARB-NACL 420 G PO SOLR: 4000.0000 mL | Freq: Once | ORAL | 0 refills | Status: AC

## 2020-01-12 ENCOUNTER — Other Ambulatory Visit (INDEPENDENT_AMBULATORY_CARE_PROVIDER_SITE_OTHER): Payer: Self-pay

## 2020-01-12 DIAGNOSIS — Z86010 Personal history of colonic polyps: Secondary | ICD-10-CM

## 2020-01-15 NOTE — Patient Instructions (Signed)
Jackson Martin  01/15/2020     @PREFPERIOPPHARMACY @   Your procedure is scheduled on  01/20/2020  Report to [facility] at  1145  A.M.  Call this number if you have problems the morning of surgery:  [phone]   Remember:  Follow the diet and prep instructions given to you by the office.                       Take these medicines the morning of surgery with A SIP OF WATER Celexa,klonazepam, diltiazem, singulair, zoloft, allopurinol. Your last dose of eliquis should be on 01/17/2020.    Do not wear jewelry, make-up or nail polish.  Do not wear lotions, powders, or perfumes. Please wear deodorant and brush your teeth.  Do not shave 48 hours prior to surgery.  Men may shave face and neck.  Do not bring valuables to the hospital.  [facility] is not responsible for any belongings or valuables.  Contacts, dentures or bridgework may not be worn into surgery.  Leave your suitcase in the car.  After surgery it may be brought to your room.  For patients admitted to the hospital, discharge time will be determined by your treatment team.  Patients discharged the day of surgery will not be allowed to drive home.   Name and phone number of your driver:   family Special instructions: DO NOT smoke the morning of your procedure.  Please read over the following fact sheets that you were given. Anesthesia Post-op Instructions and Care and Recovery After Surgery       Colonoscopy, Adult, Care After This sheet gives you information about how to care for yourself after your procedure. Your health care provider may also give you more specific instructions. If you have problems or questions, contact your health care provider. What can I expect after the procedure? After the procedure, it is common to have:  A small amount of blood in your stool for 24 hours after the procedure.  Some gas.  Mild cramping or bloating of your abdomen. Follow these instructions at home: Eating and  drinking   Drink enough fluid to keep your urine pale yellow.  Follow instructions from your health care provider about eating or drinking restrictions.  Resume your normal diet as instructed by your health care provider. Avoid heavy or fried foods that are hard to digest. Activity  Rest as told by your health care provider.  Avoid sitting for a long time without moving. Get up to take short walks every 1-2 hours. This is important to improve blood flow and breathing. Ask for help if you feel weak or unsteady.  Return to your normal activities as told by your health care provider. Ask your health care provider what activities are safe for you. Managing cramping and bloating   Try walking around when you have cramps or feel bloated.  Apply heat to your abdomen as told by your health care provider. Use the heat source that your health care provider recommends, such as a moist heat pack or a heating pad. ? Place a towel between your skin and the heat source. ? Leave the heat on for 20-30 minutes. ? Remove the heat if your skin turns bright red. This is especially important if you are unable to feel pain, heat, or cold. You may have a greater risk of getting burned. General instructions  For the first 24 hours after the procedure: ? Do not  drive or use machinery. ? Do not sign important documents. ? Do not drink alcohol. ? Do your regular daily activities at a slower pace than normal. ? Eat soft foods that are easy to digest.  Take over-the-counter and prescription medicines only as told by your health care provider.  Keep all follow-up visits as told by your health care provider. This is important. Contact a health care provider if:  You have blood in your stool 2-3 days after the procedure. Get help right away if you have:  More than a small spotting of blood in your stool.  Large blood clots in your stool.  Swelling of your abdomen.  Nausea or vomiting.  A  fever.  Increasing pain in your abdomen that is not relieved with medicine. Summary  After the procedure, it is common to have a small amount of blood in your stool. You may also have mild cramping and bloating of your abdomen.  For the first 24 hours after the procedure, do not drive or use machinery, sign important documents, or drink alcohol.  Get help right away if you have a lot of blood in your stool, nausea or vomiting, a fever, or increased pain in your abdomen. This information is not intended to replace advice given to you by your health care provider. Make sure you discuss any questions you have with your health care provider. Document Revised: 09/15/2018 Document Reviewed: 09/15/2018 Elsevier Patient Education  [place] After These instructions provide you with information about caring for yourself after your procedure. Your health care provider may also give you more specific instructions. Your treatment has been planned according to current medical practices, but problems sometimes occur. Call your health care provider if you have any problems or questions after your procedure. What can I expect after the procedure? After your procedure, you may:  Feel sleepy for several hours.  Feel clumsy and have poor balance for several hours.  Feel forgetful about what happened after the procedure.  Have poor judgment for several hours.  Feel nauseous or vomit.  Have a sore throat if you had a breathing tube during the procedure. Follow these instructions at home: For at least 24 hours after the procedure:      Have a responsible adult stay with you. It is important to have someone help care for you until you are awake and alert.  Rest as needed.  Do not: ? Participate in activities in which you could fall or become injured. ? Drive. ? Use heavy machinery. ? Drink alcohol. ? Take sleeping pills or medicines that cause  drowsiness. ? Make important decisions or sign legal documents. ? Take care of children on your own. Eating and drinking  Follow the diet that is recommended by your health care provider.  If you vomit, drink water, juice, or soup when you can drink without vomiting.  Make sure you have little or no nausea before eating solid foods. General instructions  Take over-the-counter and prescription medicines only as told by your health care provider.  If you have sleep apnea, surgery and certain medicines can increase your risk for breathing problems. Follow instructions from your health care provider about wearing your sleep device: ? Anytime you are sleeping, including during daytime naps. ? While taking prescription pain medicines, sleeping medicines, or medicines that make you drowsy.  If you smoke, do not smoke without supervision.  Keep all follow-up visits as told by your health care provider. This  is important. Contact a health care provider if:  You keep feeling nauseous or you keep vomiting.  You feel light-headed.  You develop a rash.  You have a fever. Get help right away if:  You have trouble breathing. Summary  For several hours after your procedure, you may feel sleepy and have poor judgment.  Have a responsible adult stay with you for at least 24 hours or until you are awake and alert. This information is not intended to replace advice given to you by your health care provider. Make sure you discuss any questions you have with your health care provider. Document Revised: 05/20/2017 Document Reviewed: 06/12/2015 Elsevier Patient Education  [place].

## 2020-01-18 ENCOUNTER — Encounter (HOSPITAL_COMMUNITY)
Admission: RE | Admit: 2020-01-18 | Discharge: 2020-01-18 | Disposition: A | Discharge status: Home / Self Care | Payer: Medicare PPO | Source: Ambulatory Visit | Attending: Internal Medicine | Admitting: Internal Medicine

## 2020-01-18 ENCOUNTER — Encounter (HOSPITAL_COMMUNITY): Payer: Self-pay

## 2020-01-18 ENCOUNTER — Other Ambulatory Visit: Payer: Self-pay

## 2020-01-18 ENCOUNTER — Other Ambulatory Visit (HOSPITAL_COMMUNITY)
Admission: RE | Admit: 2020-01-18 | Discharge: 2020-01-18 | Disposition: A | Discharge status: Home / Self Care | Payer: Medicare PPO | Source: Ambulatory Visit | Attending: Internal Medicine | Admitting: Internal Medicine

## 2020-01-18 DIAGNOSIS — Z01812 Encounter for preprocedural laboratory examination: Secondary | ICD-10-CM | POA: Insufficient documentation

## 2020-01-18 DIAGNOSIS — Z20822 Contact with and (suspected) exposure to covid-19: Secondary | ICD-10-CM | POA: Insufficient documentation

## 2020-01-18 LAB — SARS CORONAVIRUS 2 (TAT 6-24 HRS): SARS Coronavirus 2: NEGATIVE

## 2020-01-20 ENCOUNTER — Encounter (HOSPITAL_COMMUNITY)
Admission: RE | Disposition: A | Discharge status: Home / Self Care | Payer: Self-pay | Source: Home / Self Care | Attending: Internal Medicine

## 2020-01-20 ENCOUNTER — Ambulatory Visit (HOSPITAL_COMMUNITY): Payer: Medicare PPO | Admitting: Anesthesiology

## 2020-01-20 ENCOUNTER — Other Ambulatory Visit: Payer: Self-pay

## 2020-01-20 ENCOUNTER — Ambulatory Visit (HOSPITAL_COMMUNITY)
Admission: RE | Admit: 2020-01-20 | Discharge: 2020-01-20 | Disposition: A | Discharge status: Home / Self Care | Payer: Medicare PPO | Attending: Internal Medicine | Admitting: Internal Medicine

## 2020-01-20 ENCOUNTER — Encounter (HOSPITAL_COMMUNITY): Payer: Self-pay | Admitting: Internal Medicine

## 2020-01-20 DIAGNOSIS — K5730 Diverticulosis of large intestine without perforation or abscess without bleeding: Secondary | ICD-10-CM | POA: Insufficient documentation

## 2020-01-20 DIAGNOSIS — Z09 Encounter for follow-up examination after completed treatment for conditions other than malignant neoplasm: Secondary | ICD-10-CM | POA: Diagnosis not present

## 2020-01-20 DIAGNOSIS — Z7901 Long term (current) use of anticoagulants: Secondary | ICD-10-CM | POA: Diagnosis not present

## 2020-01-20 DIAGNOSIS — Z7951 Long term (current) use of inhaled steroids: Secondary | ICD-10-CM | POA: Insufficient documentation

## 2020-01-20 DIAGNOSIS — D123 Benign neoplasm of transverse colon: Secondary | ICD-10-CM | POA: Insufficient documentation

## 2020-01-20 DIAGNOSIS — I10 Essential (primary) hypertension: Secondary | ICD-10-CM | POA: Diagnosis not present

## 2020-01-20 DIAGNOSIS — Z79899 Other long term (current) drug therapy: Secondary | ICD-10-CM | POA: Diagnosis not present

## 2020-01-20 DIAGNOSIS — J449 Chronic obstructive pulmonary disease, unspecified: Secondary | ICD-10-CM | POA: Diagnosis not present

## 2020-01-20 DIAGNOSIS — K644 Residual hemorrhoidal skin tags: Secondary | ICD-10-CM | POA: Diagnosis not present

## 2020-01-20 DIAGNOSIS — Z86010 Personal history of colonic polyps: Secondary | ICD-10-CM | POA: Diagnosis not present

## 2020-01-20 DIAGNOSIS — D122 Benign neoplasm of ascending colon: Secondary | ICD-10-CM | POA: Diagnosis not present

## 2020-01-20 DIAGNOSIS — D124 Benign neoplasm of descending colon: Secondary | ICD-10-CM | POA: Diagnosis not present

## 2020-01-20 DIAGNOSIS — D125 Benign neoplasm of sigmoid colon: Secondary | ICD-10-CM | POA: Diagnosis not present

## 2020-01-20 DIAGNOSIS — K635 Polyp of colon: Secondary | ICD-10-CM | POA: Diagnosis not present

## 2020-01-20 HISTORY — PX: COLONOSCOPY WITH PROPOFOL: SHX5780

## 2020-01-20 HISTORY — PX: POLYPECTOMY: SHX5525

## 2020-01-20 LAB — HM COLONOSCOPY

## 2020-01-20 SURGERY — COLONOSCOPY WITH PROPOFOL
Anesthesia: General

## 2020-01-20 MED ORDER — LACTATED RINGERS IV SOLN: INTRAVENOUS | Status: DC | PRN

## 2020-01-20 MED ORDER — CHLORHEXIDINE GLUCONATE CLOTH 2 % EX PADS: 6.0000 | MEDICATED_PAD | Freq: Once | CUTANEOUS | Status: DC

## 2020-01-20 MED ORDER — PROPOFOL 500 MG/50ML IV EMUL
INTRAVENOUS | Status: DC | PRN
  Administered 2020-01-20: 70 ug/kg/min via INTRAVENOUS

## 2020-01-20 MED ORDER — LACTATED RINGERS IV SOLN
Freq: Once | INTRAVENOUS | Status: AC
  Administered 2020-01-20: 1000 mL via INTRAVENOUS

## 2020-01-20 MED ORDER — PROPOFOL 10 MG/ML IV BOLUS
INTRAVENOUS | Status: DC | PRN
  Administered 2020-01-20: 70 mg via INTRAVENOUS

## 2020-01-20 MED ORDER — LIDOCAINE HCL (CARDIAC) PF 100 MG/5ML IV SOSY
PREFILLED_SYRINGE | INTRAVENOUS | Status: DC | PRN
  Administered 2020-01-20: 50 mg via INTRAVENOUS

## 2020-01-20 NOTE — Op Note (Signed)
[facility] Patient Name: Jackson Martin Procedure Date: 01/20/2020 11:26 AM MRN: [id number] Date of Birth: [birth year] Attending MD: Hildred Laser , MD CSN: [id number] Age: 79 Admit Type: Outpatient Procedure:                Colonoscopy Indications:              High risk colon cancer surveillance: Personal                            history of colonic polyps Providers:                Hildred Laser, MD, Otis Peak B. Sharon Seller, RN, Lambert Mody, Randa Spike, Technician Referring MD:             Fuller Canada Manuella Ghazi, MD Medicines:                Propofol per Anesthesia Complications:            No immediate complications. Estimated Blood Loss:     Estimated blood loss was minimal. Procedure:                Pre-Anesthesia Assessment:                           - Prior to the procedure, a History and Physical                            was performed, and patient medications and                            allergies were reviewed. The patient's tolerance of                            previous anesthesia was also reviewed. The risks                            and benefits of the procedure and the sedation                            options and risks were discussed with the patient.                            All questions were answered, and informed consent                            was obtained. Prior Anticoagulants: The patient                            last took Eliquis (apixaban) 2 days prior to the                            procedure. ASA Grade Assessment: III - A patient  with severe systemic disease. After reviewing the                            risks and benefits, the patient was deemed in                            satisfactory condition to undergo the procedure.                           After obtaining informed consent, the colonoscope                            was passed under direct vision. Throughout the                             procedure, the patient's blood pressure, pulse, and                            oxygen saturations were monitored continuously. The                            [id number]-[id number] ([id number]) scope was introduced through                            the anus and advanced to the the cecum, identified                            by appendiceal orifice and ileocecal valve. The                            colonoscopy was performed without difficulty. The                            patient tolerated the procedure well. The quality                            of the bowel preparation was adequate. The                            ileocecal valve, appendiceal orifice, and rectum                            were photographed. Scope In: 11:50:06 AM Scope Out: 12:17:56 PM Scope Withdrawal Time: 0 hours 19 minutes 52 seconds  Total Procedure Duration: 0 hours 27 minutes 50 seconds  Findings:      The perianal and digital rectal examinations were normal.      Multiple diverticula were found in the entire colon.      Five polyps were found in the sigmoid colon, descending colon,       transverse colon and ascending colon. The polyps were 3 to 6 mm in size.       These polyps were removed with a cold snare. Resection and retrieval       were complete. The pathology specimen was placed into Bottle Number 1.  External hemorrhoids were found during retroflexion. The hemorrhoids       were medium-sized. Impression:               - Diverticulosis in the entire examined colon.                           - Five 3 to 6 mm polyps in the sigmoid colon, in                            the descending colon, in the transverse colon and                            in the ascending colon, removed with a cold snare.                            Resected and retrieved.                           - External hemorrhoids. Moderate Sedation:      Per Anesthesia Care Recommendation:           - Patient has a contact number  available for                            emergencies. The signs and symptoms of potential                            delayed complications were discussed with the                            patient. Return to normal activities tomorrow.                            Written discharge instructions were provided to the                            patient.                           - High fiber diet today.                           - Continue present medications.                           - Resume Eliquis (apixaban) at prior dose tomorrow.                           - Await pathology results.                           - Repeat colonoscopy is recommended. The                            colonoscopy date will be determined after pathology  results from today's exam become available for                            review. Procedure Code(s):        --- Professional ---                           [phone], Colonoscopy, flexible; with removal of                            tumor(s), polyp(s), or other lesion(s) by snare                            technique Diagnosis Code(s):        --- Professional ---                           Z86.010, Personal history of colonic polyps                           K64.4, Residual hemorrhoidal skin tags                           K63.5, Polyp of colon                           K57.30, Diverticulosis of large intestine without                            perforation or abscess without bleeding CPT copyright 2019 American Medical Association. All rights reserved. The codes documented in this report are preliminary and upon coder review may  be revised to meet current compliance requirements. Hildred Laser, MD Hildred Laser, MD 01/20/2020 12:26:13 PM This report has been signed electronically. Number of Addenda: 0

## 2020-01-20 NOTE — H&P (Signed)
Jackson Martin is an 79 y.o. male.   Chief Complaint: Patient is here for colonoscopy. HPI: Patient is 79 year old Caucasian male who is here for surveillance colonoscopy.  He has history of colonic adenomas.  Last exam was September 2018 with removal of 10 small polyps and they are all tubular adenomas.  Patient denies rectal bleeding.  His bowels have been regular.  He was treated at this facility back in June for diverticulitis. Patient's Eliquis is on hold. History is negative for CRC.  Past Medical History:  Diagnosis Date   Anxiety    Asthmatic bronchitis    Atrial fibrillation (HCC)    Backache    Colon polyps    COPD (chronic obstructive pulmonary disease) ([place])    Diverticulitis 12/2019   Esophageal reflux    Essential hypertension    Gout    Hemorrhoid    Hyperlipidemia    Insomnia    Ischemic heart disease    MI (myocardial infarction) ([place]) 2003   Neck pain    Plantar fasciitis    Prostate cancer [facility])     Past Surgical History:  Procedure Laterality Date   COLONOSCOPY     COLONOSCOPY N/A 11/15/2016   Procedure: COLONOSCOPY;  Surgeon: Rogene Houston, MD;  Location: [facility];  Service: Endoscopy;  Laterality: N/A;  [place] WITH STENT PLACEMENT  2007   4 stents placed   POLYPECTOMY  11/15/2016   Procedure: POLYPECTOMY;  Surgeon: Rogene Houston, MD;  Location: [facility];  Service: Endoscopy;;  colon   PROSTATECTOMY  2001    Family History  Problem Relation Age of Onset   Hypertension Mother    Heart disease Mother    Heart disease Maternal Aunt    Heart disease Maternal Uncle    Heart disease Maternal Grandmother    Heart disease Maternal Grandfather    Colon cancer Brother    Breast cancer Sister    Lung cancer Sister    Lung disease Sister    Social History:  reports that he quit smoking about 28 years ago. His smoking use included cigarettes. He has a 2.25 pack-year smoking history. He  has never used smokeless tobacco. He reports that he does not drink alcohol and does not use drugs.  Allergies: No Known Allergies  Medications Prior to Admission  Medication Sig Dispense Refill   allopurinol (ZYLOPRIM) 300 MG tablet Take 300 mg by mouth daily.      apixaban (ELIQUIS) 5 MG TABS tablet Take 1 tablet (5 mg total) by mouth 2 (two) times daily. 180 tablet 2   atorvastatin (LIPITOR) 20 MG tablet Take 20 mg by mouth at bedtime.      budesonide-formoterol (SYMBICORT) 80-4.5 MCG/ACT inhaler Inhale 2 puffs into the lungs 2 (two) times daily. (Patient taking differently: Inhale 2 puffs into the lungs 2 (two) times daily as needed (shortness of breath). ) 1 Inhaler 11   citalopram (CELEXA) 20 MG tablet Take 20 mg by mouth daily.     clonazePAM (KLONOPIN) 0.5 MG tablet Take 0.5 mg by mouth 3 (three) times daily.      diltiazem (CARDIZEM CD) 240 MG 24 hr capsule Take 1 capsule by mouth once daily (Patient taking differently: Take 240 mg by mouth daily. ) 90 capsule 1   Elastic Bandages & Supports (MEDICAL COMPRESSION STOCKINGS) MISC 1 each by Does not apply route as directed. 1 pair of low pressure below the knee compression stockings Dx: mild orthostatic hypotension  2 each 0   Melatonin 10 MG TABS Take 10 mg by mouth at bedtime.      montelukast (SINGULAIR) 10 MG tablet Take 10 mg by mouth at bedtime.     sertraline (ZOLOFT) 100 MG tablet Take 100 mg by mouth daily as needed (anxiety).      albuterol (PROVENTIL) (2.5 MG/3ML) 0.083% nebulizer solution Take 2.5 mg by nebulization every 6 (six) hours as needed for wheezing or shortness of breath. (Patient not taking: Reported on 01/13/2020)      No results found for this or any previous visit (from the past 48 hour(s)). No results found.  Review of Systems  Blood pressure 127/75, temperature 97.7 F (36.5 C), temperature source Oral, resp. rate 14, SpO2 97 %. Physical Exam HENT:     Mouth/Throat:     Mouth: Mucous  membranes are moist.     Pharynx: Oropharynx is clear.  Eyes:     General: No scleral icterus.    Conjunctiva/sclera: Conjunctivae normal.  Cardiovascular:     Rate and Rhythm: Normal rate. Rhythm irregular.     Heart sounds: Normal heart sounds. No murmur heard.   Pulmonary:     Effort: Pulmonary effort is normal.     Breath sounds: Normal breath sounds.  Abdominal:     General: There is no distension.     Palpations: Abdomen is soft. There is no mass.     Tenderness: There is no abdominal tenderness.  Musculoskeletal:        General: No swelling.     Cervical back: Neck supple.  Lymphadenopathy:     Cervical: No cervical adenopathy.  Skin:    General: Skin is warm and dry.  Neurological:     Mental Status: He is alert.      Assessment/Plan  History of colonic multiple adenomas. Surveillance colonoscopy.  Hildred Laser, MD 01/20/2020, 11:42 AM

## 2020-01-20 NOTE — Anesthesia Procedure Notes (Signed)
Date/Time: 01/20/2020 11:50 AM Performed by: Orlie Dakin, CRNA Pre-anesthesia Checklist: Patient identified, Emergency Drugs available, Suction available and Patient being monitored Patient Re-evaluated:Patient Re-evaluated prior to induction Oxygen Delivery Method: Nasal cannula Induction Type: IV induction Placement Confirmation: positive ETCO2

## 2020-01-20 NOTE — Transfer of Care (Signed)
Immediate Anesthesia Transfer of Care Note  Patient: Jackson Martin  Procedure(s) Performed: COLONOSCOPY WITH PROPOFOL (N/A ) POLYPECTOMY  Patient Location: PACU  Anesthesia Type:General  Level of Consciousness: awake, alert  and oriented  Airway & Oxygen Therapy: Patient Spontanous Breathing  Post-op Assessment: Report given to RN and Post -op Vital signs reviewed and stable  Post vital signs: Reviewed and stable  Last Vitals:  Vitals Value Taken Time  BP 94/61 01/20/20 1223  Temp 36.3 C 01/20/20 1223  Pulse 74 01/20/20 1225  Resp 17 01/20/20 1225  SpO2 97 % 01/20/20 1225  Vitals shown include unvalidated device data.  Last Pain:  Vitals:   01/20/20 1147  TempSrc:   PainSc: 0-No pain      Patients Stated Pain Goal: 8 (14/78/29 5621)  Complications: No complications documented.

## 2020-01-20 NOTE — Anesthesia Postprocedure Evaluation (Signed)
Anesthesia Post Note  Patient: Jackson Martin  Procedure(s) Performed: COLONOSCOPY WITH PROPOFOL (N/A ) POLYPECTOMY  Patient location during evaluation: Phase II Anesthesia Type: General Level of consciousness: awake and alert and oriented Pain management: pain level controlled Vital Signs Assessment: post-procedure vital signs reviewed and stable Respiratory status: spontaneous breathing, nonlabored ventilation and respiratory function stable Cardiovascular status: blood pressure returned to baseline and stable Postop Assessment: no apparent nausea or vomiting Anesthetic complications: no   No complications documented.   Last Vitals:  Vitals:   01/20/20 1230 01/20/20 1245  BP: (!) 104/56 127/77  Pulse: 76 86  Resp: 20 20  Temp:  36.5 C  SpO2: 96% 96%    Last Pain:  Vitals:   01/20/20 1245  TempSrc: Oral  PainSc: 0-No pain                 Orlie Dakin

## 2020-01-20 NOTE — Discharge Instructions (Signed)
Colonoscopy, Adult, Care After This sheet gives you information about how to care for yourself after your procedure. Your doctor may also give you more specific instructions. If you have problems or questions, call your doctor. What can I expect after the procedure? After the procedure, it is common to have:  A small amount of blood in your poop (stool) for 24 hours.  Some gas.  Mild cramping or bloating in your belly (abdomen). Follow these instructions at home: Eating and drinking   Drink enough fluid to keep your pee (urine) pale yellow.  Follow instructions from your doctor about what you cannot eat or drink.  Return to your normal diet as told by your doctor. Avoid heavy or fried foods that are hard to digest. Activity  Rest as told by your doctor.  Do not sit for a long time without moving. Get up to take short walks every 1-2 hours. This is important. Ask for help if you feel weak or unsteady.  Return to your normal activities as told by your doctor. Ask your doctor what activities are safe for you. To help cramping and bloating:   Try walking around.  Put heat on your belly as told by your doctor. Use the heat source that your doctor recommends, such as a moist heat pack or a heating pad. ? Put a towel between your skin and the heat source. ? Leave the heat on for 20-30 minutes. ? Remove the heat if your skin turns bright red. This is very important if you are unable to feel pain, heat, or cold. You may have a greater risk of getting burned. General instructions  For the first 24 hours after the procedure: ? Do not drive or use machinery. ? Do not sign important documents. ? Do not drink alcohol. ? Do your daily activities more slowly than normal. ? Eat foods that are soft and easy to digest.  Take over-the-counter or prescription medicines only as told by your doctor.  Keep all follow-up visits as told by your doctor. This is important. Contact a doctor  if:  You have blood in your poop 2-3 days after the procedure. Get help right away if:  You have more than a small amount of blood in your poop.  You see large clumps of tissue (blood clots) in your poop.  Your belly is swollen.  You feel like you may vomit (nauseous).  You vomit.  You have a fever.  You have belly pain that gets worse, and medicine does not help your pain. Summary  After the procedure, it is common to have a small amount of blood in your poop. You may also have mild cramping and bloating in your belly.  For the first 24 hours after the procedure, do not drive or use machinery, do not sign important documents, and do not drink alcohol.  Get help right away if you have a lot of blood in your poop, feel like you may vomit, have a fever, or have more belly pain. This information is not intended to replace advice given to you by your health care provider. Make sure you discuss any questions you have with your health care provider. Document Revised: 09/15/2018 Document Reviewed: 09/15/2018 Elsevier Patient Education  [place]. Colon Polyps  Polyps are tissue growths inside the body. Polyps can grow in many places, including the large intestine (colon). A polyp may be a round bump or a mushroom-shaped growth. You could have one polyp or several. Most  colon polyps are noncancerous (benign). However, some colon polyps can become cancerous over time. Finding and removing the polyps early can help prevent this. What are the causes? The exact cause of colon polyps is not known. What increases the risk? You are more likely to develop this condition if you:  Have a family history of colon cancer or colon polyps.  Are older than 10 or older than 45 if you are African American.  Have inflammatory bowel disease, such as ulcerative colitis or Crohn's disease.  Have certain hereditary conditions, such as: ? Familial adenomatous polyposis. ? Lynch  syndrome. ? Turcot syndrome. ? Peutz-Jeghers syndrome.  Are overweight.  Smoke cigarettes.  Do not get enough exercise.  Drink too much alcohol.  Eat a diet that is high in fat and red meat and low in fiber.  Had childhood cancer that was treated with abdominal radiation. What are the signs or symptoms? Most polyps do not cause symptoms. If you have symptoms, they may include:  Blood coming from your rectum when having a bowel movement.  Blood in your stool. The stool may look dark red or black.  Abdominal pain.  A change in bowel habits, such as constipation or diarrhea. How is this diagnosed? This condition is diagnosed with a colonoscopy. This is a procedure in which a lighted, flexible scope is inserted into the anus and then passed into the colon to examine the area. Polyps are sometimes found when a colonoscopy is done as part of routine cancer screening tests. How is this treated? Treatment for this condition involves removing any polyps that are found. Most polyps can be removed during a colonoscopy. Those polyps will then be tested for cancer. Additional treatment may be needed depending on the results of testing. Follow these instructions at home: Lifestyle  Maintain a healthy weight, or lose weight if recommended by your health care provider.  Exercise every day or as told by your health care provider.  Do not use any products that contain nicotine or tobacco, such as cigarettes and e-cigarettes. If you need help quitting, ask your health care provider.  If you drink alcohol, limit how much you have: ? 0-1 drink a day for women. ? 0-2 drinks a day for men.  Be aware of how much alcohol is in your drink. In the [place] one drink equals one 12 oz bottle of beer (355 mL), one 5 oz glass of wine (148 mL), or one 1 oz shot of hard liquor (44 mL). Eating and drinking   Eat foods that are high in fiber, such as fruits, vegetables, and whole grains.  Eat foods that  are high in calcium and vitamin D, such as milk, cheese, yogurt, eggs, liver, fish, and broccoli.  Limit foods that are high in fat, such as fried foods and desserts.  Limit the amount of red meat and processed meat you eat, such as hot dogs, sausage, bacon, and lunch meats. General instructions  Keep all follow-up visits as told by your health care provider. This is important. ? This includes having regularly scheduled colonoscopies. ? Talk to your health care provider about when you need a colonoscopy. Contact a health care provider if:  You have new or worsening bleeding during a bowel movement.  You have new or increased blood in your stool.  You have a change in bowel habits.  You lose weight for no known reason. Summary  Polyps are tissue growths inside the body. Polyps can grow in many places,  including the colon.  Most colon polyps are noncancerous (benign), but some can become cancerous over time.  This condition is diagnosed with a colonoscopy.  Treatment for this condition involves removing any polyps that are found. Most polyps can be removed during a colonoscopy. This information is not intended to replace advice given to you by your health care provider. Make sure you discuss any questions you have with your health care provider. Document Revised: 06/06/2017 Document Reviewed: 06/06/2017 Elsevier Patient Education  [place]. Diverticulosis  Diverticulosis is a condition that develops when small pouches (diverticula) form in the wall of the large intestine (colon). The colon is where water is absorbed and stool (feces) is formed. The pouches form when the inside layer of the colon pushes through weak spots in the outer layers of the colon. You may have a few pouches or many of them. The pouches usually do not cause problems unless they become inflamed or infected. When this happens, the condition is called diverticulitis. What are the causes? The cause of  this condition is not known. What increases the risk? The following factors may make you more likely to develop this condition:  Being older than age 26. Your risk for this condition increases with age. Diverticulosis is rare among people younger than age 41. By age 51, many people have it.  Eating a low-fiber diet.  Having frequent constipation.  Being overweight.  Not getting enough exercise.  Smoking.  Taking over-the-counter pain medicines, like aspirin and ibuprofen.  Having a family history of diverticulosis. What are the signs or symptoms? In most people, there are no symptoms of this condition. If you do have symptoms, they may include:  Bloating.  Cramps in the abdomen.  Constipation or diarrhea.  Pain in the lower left side of the abdomen. How is this diagnosed? Because diverticulosis usually has no symptoms, it is most often diagnosed during an exam for other colon problems. The condition may be diagnosed by:  Using a flexible scope to examine the colon (colonoscopy).  Taking an X-ray of the colon after dye has been put into the colon (barium enema).  Having a CT scan. How is this treated? You may not need treatment for this condition. Your health care provider may recommend treatment to prevent problems. You may need treatment if you have symptoms or if you previously had diverticulitis. Treatment may include:  Eating a high-fiber diet.  Taking a fiber supplement.  Taking a live bacteria supplement (probiotic).  Taking medicine to relax your colon. Follow these instructions at home: Medicines  Take over-the-counter and prescription medicines only as told by your health care provider.  If told by your health care provider, take a fiber supplement or probiotic. Constipation prevention Your condition may cause constipation. To prevent or treat constipation, you may need to:  Drink enough fluid to keep your urine pale yellow.  Take over-the-counter or  prescription medicines.  Eat foods that are high in fiber, such as beans, whole grains, and fresh fruits and vegetables.  Limit foods that are high in fat and processed sugars, such as fried or sweet foods.  General instructions  Try not to strain when you have a bowel movement.  Keep all follow-up visits as told by your health care provider. This is important. Contact a health care provider if you:  Have pain in your abdomen.  Have bloating.  Have cramps.  Have not had a bowel movement in 3 days. Get help right away if:  Your pain gets worse.  Your bloating becomes very bad.  You have a fever or chills, and your symptoms suddenly get worse.  You vomit.  You have bowel movements that are bloody or black.  You have bleeding from your rectum. Summary  Diverticulosis is a condition that develops when small pouches (diverticula) form in the wall of the large intestine (colon).  You may have a few pouches or many of them.  This condition is most often diagnosed during an exam for other colon problems.  Treatment may include increasing the fiber in your diet, taking supplements, or taking medicines. This information is not intended to replace advice given to you by your health care provider. Make sure you discuss any questions you have with your health care provider. Document Revised: 09/18/2018 Document Reviewed: 09/18/2018 Elsevier Patient Education  [place]. Hemorrhoids Hemorrhoids are swollen veins that may develop:  In the butt (rectum). These are called internal hemorrhoids.  Around the opening of the butt (anus). These are called external hemorrhoids. Hemorrhoids can cause pain, itching, or bleeding. Most of the time, they do not cause serious problems. They usually get better with diet changes, lifestyle changes, and other home treatments. What are the causes? This condition may be caused by:  Having trouble pooping (constipation).  Pushing hard  (straining) to poop.  Watery poop (diarrhea).  Pregnancy.  Being very overweight (obese).  Sitting for long periods of time.  Heavy lifting or other activity that causes you to strain.  Anal sex.  Riding a bike for a long period of time. What are the signs or symptoms? Symptoms of this condition include:  Pain.  Itching or soreness in the butt.  Bleeding from the butt.  Leaking poop.  Swelling in the area.  One or more lumps around the opening of your butt. How is this diagnosed? A doctor can often diagnose this condition by looking at the affected area. The doctor may also:  Do an exam that involves feeling the area with a gloved hand (digital rectal exam).  Examine the area inside your butt using a small tube (anoscope).  Order blood tests. This may be done if you have lost a lot of blood.  Have you get a test that involves looking inside the colon using a flexible tube with a camera on the end (sigmoidoscopy or colonoscopy). How is this treated? This condition can usually be treated at home. Your doctor may tell you to change what you eat, make lifestyle changes, or try home treatments. If these do not help, procedures can be done to remove the hemorrhoids or make them smaller. These may involve:  Placing rubber bands at the base of the hemorrhoids to cut off their blood supply.  Injecting medicine into the hemorrhoids to shrink them.  Shining a type of light energy onto the hemorrhoids to cause them to fall off.  Doing surgery to remove the hemorrhoids or cut off their blood supply. Follow these instructions at home: Eating and drinking   Eat foods that have a lot of fiber in them. These include whole grains, beans, nuts, fruits, and vegetables.  Ask your doctor about taking products that have added fiber (fibersupplements).  Reduce the amount of fat in your diet. You can do this by: ? Eating low-fat dairy products. ? Eating less red meat. ? Avoiding  processed foods.  Drink enough fluid to keep your pee (urine) pale yellow. Managing pain and swelling   Take a warm-water bath (sitz  bath) for 20 minutes to ease pain. Do this 3-4 times a day. You may do this in a bathtub or using a portable sitz bath that fits over the toilet.  If told, put ice on the painful area. It may be helpful to use ice between your warm baths. ? Put ice in a plastic bag. ? Place a towel between your skin and the bag. ? Leave the ice on for 20 minutes, 2-3 times a day. General instructions  Take over-the-counter and prescription medicines only as told by your doctor. ? Medicated creams and medicines may be used as told.  Exercise often. Ask your doctor how much and what kind of exercise is best for you.  Go to the bathroom when you have the urge to poop. Do not wait.  Avoid pushing too hard when you poop.  Keep your butt dry and clean. Use wet toilet paper or moist towelettes after pooping.  Do not sit on the toilet for a long time.  Keep all follow-up visits as told by your doctor. This is important. Contact a doctor if you:  Have pain and swelling that do not get better with treatment or medicine.  Have trouble pooping.  Cannot poop.  Have pain or swelling outside the area of the hemorrhoids. Get help right away if you have:  Bleeding that will not stop. Summary  Hemorrhoids are swollen veins in the butt or around the opening of the butt.  They can cause pain, itching, or bleeding.  Eat foods that have a lot of fiber in them. These include whole grains, beans, nuts, fruits, and vegetables.  Take a warm-water bath (sitz bath) for 20 minutes to ease pain. Do this 3-4 times a day. This information is not intended to replace advice given to you by your health care provider. Make sure you discuss any questions you have with your health care provider. Document Revised: 02/27/2018 Document Reviewed: 07/11/2017 Elsevier Patient Education  [employer]. High-Fiber Diet Fiber, also called dietary fiber, is a type of carbohydrate that is found in fruits, vegetables, whole grains, and beans. A high-fiber diet can have many health benefits. Your health care provider may recommend a high-fiber diet to help:  Prevent constipation. Fiber can make your bowel movements more regular.  Lower your cholesterol.  Relieve the following conditions: ? Swelling of veins in the anus (hemorrhoids). ? Swelling and irritation (inflammation) of specific areas of the digestive tract (uncomplicated diverticulosis). ? A problem of the large intestine (colon) that sometimes causes pain and diarrhea (irritable bowel syndrome, IBS).  Prevent overeating as part of a weight-loss plan.  Prevent heart disease, type 2 diabetes, and certain cancers. What is my plan? The recommended daily fiber intake in grams (g) includes:  38 g for men age 39 or younger.  30 g for men over age 55.  57 g for women age 65 or younger.  21 g for women over age 12. You can get the recommended daily intake of dietary fiber by:  Eating a variety of fruits, vegetables, grains, and beans.  Taking a fiber supplement, if it is not possible to get enough fiber through your diet. What do I need to know about a high-fiber diet?  It is better to get fiber through food sources rather than from fiber supplements. There is not a lot of research about how effective supplements are.  Always check the fiber content on the nutrition facts label of any prepackaged food. Look for foods  that contain 5 g of fiber or more per serving.  Talk with a diet and nutrition specialist (dietitian) if you have questions about specific foods that are recommended or not recommended for your medical condition, especially if those foods are not listed below.  Gradually increase how much fiber you consume. If you increase your intake of dietary fiber too quickly, you may have bloating, cramping, or  gas.  Drink plenty of water. Water helps you to digest fiber. What are tips for following this plan?  Eat a wide variety of high-fiber foods.  Make sure that half of the grains that you eat each day are whole grains.  Eat breads and cereals that are made with whole-grain flour instead of refined flour or white flour.  Eat brown rice, bulgur wheat, or millet instead of white rice.  Start the day with a breakfast that is high in fiber, such as a cereal that contains 5 g of fiber or more per serving.  Use beans in place of meat in soups, salads, and pasta dishes.  Eat high-fiber snacks, such as berries, raw vegetables, nuts, and popcorn.  Choose whole fruits and vegetables instead of processed forms like juice or sauce. What foods can I eat?  Fruits Berries. Pears. Apples. Oranges. Avocado. Prunes and raisins. Dried figs. Vegetables Sweet potatoes. Spinach. Kale. Artichokes. Cabbage. Broccoli. Cauliflower. Green peas. Carrots. Squash. Grains Whole-grain breads. Multigrain cereal. Oats and oatmeal. Brown rice. Barley. Bulgur wheat. [place]. Quinoa. Bran muffins. Popcorn. Rye wafer crackers. Meats and other proteins [place], kidney, and pinto beans. Soybeans. Split peas. Lentils. Nuts and seeds. Dairy Fiber-fortified yogurt. Beverages Fiber-fortified soy milk. Fiber-fortified orange juice. Other foods Fiber bars. The items listed above may not be a complete list of recommended foods and beverages. Contact a dietitian for more options. What foods are not recommended? Fruits Fruit juice. Cooked, strained fruit. Vegetables Fried potatoes. Canned vegetables. Well-cooked vegetables. Grains White bread. Pasta made with refined flour. White rice. Meats and other proteins Fatty cuts of meat. Fried chicken or fried fish. Dairy Milk. Yogurt. Cream cheese. Sour cream. Fats and oils Butters. Beverages Soft drinks. Other foods Cakes and pastries. The items listed above may not be a  complete list of foods and beverages to avoid. Contact a dietitian for more information. Summary  Fiber is a type of carbohydrate. It is found in fruits, vegetables, whole grains, and beans.  There are many health benefits of eating a high-fiber diet, such as preventing constipation, lowering blood cholesterol, helping with weight loss, and reducing your risk of heart disease, diabetes, and certain cancers.  Gradually increase your intake of fiber. Increasing too fast can result in cramping, bloating, and gas. Drink plenty of water while you increase your fiber.  The best sources of fiber include whole fruits and vegetables, whole grains, nuts, seeds, and beans. This information is not intended to replace advice given to you by your health care provider. Make sure you discuss any questions you have with your health care provider. Document Revised: 12/24/2016 Document Reviewed: 12/24/2016 Elsevier Patient Education  2020 [place] on 01/21/2020 Resume other medications as before. High-fiber diet. No driving for 24 hours. Physician will call with biopsy results.

## 2020-01-20 NOTE — Anesthesia Preprocedure Evaluation (Signed)
Anesthesia Evaluation  Patient identified by MRN, date of birth, ID band Patient awake    Reviewed: Allergy & Precautions, NPO status , Patient's Chart, lab work & pertinent test results  History of Anesthesia Complications Negative for: history of anesthetic complications  Airway Mallampati: II  TM Distance: >3 FB Neck ROM: Full    Dental  (+) Edentulous Upper, Missing, Dental Advisory Given   Pulmonary asthma , COPD,  COPD inhaler, former smoker,    Pulmonary exam normal breath sounds clear to auscultation       Cardiovascular hypertension, Pt. on medications + Past MI and + Cardiac Stents (last stent 8 years ago)  Normal cardiovascular exam+ dysrhythmias Atrial Fibrillation  Rhythm:Regular Rate:Normal  Atrial fibrillation  - occasional ectopic ventricular beat    -Right bundle branch block.   ABNORMAL    Neuro/Psych Anxiety    GI/Hepatic Neg liver ROS, GERD  Medicated and Controlled,  Endo/Other  negative endocrine ROS  Renal/GU negative Renal ROS  negative genitourinary   Musculoskeletal negative musculoskeletal ROS (+)   Abdominal   Peds  Hematology negative hematology ROS (+)   Anesthesia Other Findings   Reproductive/Obstetrics negative OB ROS                            Anesthesia Physical Anesthesia Plan  ASA: III  Anesthesia Plan: General   Post-op Pain Management:    Induction: Intravenous  PONV Risk Score and Plan: TIVA  Airway Management Planned: Nasal Cannula, Natural Airway and Simple Face Mask  Additional Equipment:   Intra-op Plan:   Post-operative Plan:   Informed Consent: I have reviewed the patients History and Physical, chart, labs and discussed the procedure including the risks, benefits and alternatives for the proposed anesthesia with the patient or authorized representative who has indicated his/her understanding and acceptance.     Dental  advisory given  Plan Discussed with: CRNA and Surgeon  Anesthesia Plan Comments:         Anesthesia Quick Evaluation

## 2020-01-21 LAB — SURGICAL PATHOLOGY

## 2020-01-25 ENCOUNTER — Encounter (HOSPITAL_COMMUNITY): Payer: Self-pay | Admitting: Internal Medicine

## 2020-01-26 ENCOUNTER — Other Ambulatory Visit: Payer: Self-pay | Admitting: Cardiology

## 2020-02-17 ENCOUNTER — Other Ambulatory Visit: Payer: Self-pay | Admitting: *Deleted

## 2020-02-17 MED ORDER — APIXABAN 5 MG PO TABS
5.0000 mg | ORAL_TABLET | Freq: Two times a day (BID) | ORAL | 3 refills | Status: DC
Start: 2020-02-17 — End: 2020-05-04

## 2020-02-18 ENCOUNTER — Encounter (INDEPENDENT_AMBULATORY_CARE_PROVIDER_SITE_OTHER): Payer: Self-pay | Admitting: *Deleted

## 2020-04-20 ENCOUNTER — Telehealth: Payer: Self-pay | Admitting: *Deleted

## 2020-04-20 NOTE — Telephone Encounter (Signed)
3% out of pocket expense for eliquis patient assistance is $457.39 per Arlene at [facility] Patient informed and verbalized understanding of plan. Awaiting 2022 Rx out of pocket expense.

## 2020-04-26 DIAGNOSIS — D6869 Other thrombophilia: Secondary | ICD-10-CM | POA: Diagnosis not present

## 2020-04-26 DIAGNOSIS — K5900 Constipation, unspecified: Secondary | ICD-10-CM | POA: Diagnosis not present

## 2020-04-26 DIAGNOSIS — Z6823 Body mass index (BMI) 23.0-23.9, adult: Secondary | ICD-10-CM | POA: Diagnosis not present

## 2020-04-26 DIAGNOSIS — I10 Essential (primary) hypertension: Secondary | ICD-10-CM | POA: Diagnosis not present

## 2020-04-26 DIAGNOSIS — Z299 Encounter for prophylactic measures, unspecified: Secondary | ICD-10-CM | POA: Diagnosis not present

## 2020-04-26 DIAGNOSIS — Z87891 Personal history of nicotine dependence: Secondary | ICD-10-CM | POA: Diagnosis not present

## 2020-04-26 DIAGNOSIS — F419 Anxiety disorder, unspecified: Secondary | ICD-10-CM | POA: Diagnosis not present

## 2020-05-03 ENCOUNTER — Telehealth: Payer: Self-pay | Admitting: *Deleted

## 2020-05-03 NOTE — Telephone Encounter (Signed)
Brought out of pocket prescription expense to office in the amount of $36.02 towards $457.39. Still need to spend $421.37 at the pharmacy to get eliquis free from [facility]. Brought other receipts from doctors visits etc-will leave up front for patient pick up since this wasn't needed for eliquis application.

## 2020-05-04 ENCOUNTER — Other Ambulatory Visit: Payer: Self-pay | Admitting: *Deleted

## 2020-05-04 MED ORDER — APIXABAN 5 MG PO TABS: 5.0000 mg | ORAL_TABLET | Freq: Two times a day (BID) | ORAL | 3 refills | Status: DC

## 2020-05-04 NOTE — Telephone Encounter (Signed)
Patient informed and verbalized understanding of plan. Available pharmacy receipts in the amount of $36.02 faxed to [facility].

## 2020-05-06 ENCOUNTER — Telehealth: Payer: Self-pay | Admitting: Cardiology

## 2020-05-06 MED ORDER — APIXABAN 5 MG PO TABS: 5.0000 mg | ORAL_TABLET | Freq: Two times a day (BID) | ORAL | 0 refills | Status: DC

## 2020-05-06 NOTE — Telephone Encounter (Signed)
Pt came into the office requesting samples of apixaban (ELIQUIS) 5 MG TABS tablet [[id number]]  He spent $57 on 6 pills today and cannot afford it.

## 2020-05-22 ENCOUNTER — Emergency Department (HOSPITAL_COMMUNITY): Payer: Medicare PPO

## 2020-05-22 ENCOUNTER — Emergency Department (HOSPITAL_COMMUNITY)
Admission: EM | Admit: 2020-05-22 | Discharge: 2020-05-22 | Disposition: A | Discharge status: Home / Self Care | Payer: Medicare PPO | Attending: Emergency Medicine | Admitting: Emergency Medicine

## 2020-05-22 ENCOUNTER — Other Ambulatory Visit: Payer: Self-pay

## 2020-05-22 ENCOUNTER — Encounter (HOSPITAL_COMMUNITY): Payer: Self-pay

## 2020-05-22 DIAGNOSIS — R109 Unspecified abdominal pain: Secondary | ICD-10-CM | POA: Diagnosis not present

## 2020-05-22 DIAGNOSIS — Z7951 Long term (current) use of inhaled steroids: Secondary | ICD-10-CM | POA: Insufficient documentation

## 2020-05-22 DIAGNOSIS — Z8546 Personal history of malignant neoplasm of prostate: Secondary | ICD-10-CM | POA: Diagnosis not present

## 2020-05-22 DIAGNOSIS — R339 Retention of urine, unspecified: Secondary | ICD-10-CM

## 2020-05-22 DIAGNOSIS — Z7901 Long term (current) use of anticoagulants: Secondary | ICD-10-CM | POA: Insufficient documentation

## 2020-05-22 DIAGNOSIS — K5900 Constipation, unspecified: Secondary | ICD-10-CM | POA: Insufficient documentation

## 2020-05-22 DIAGNOSIS — I10 Essential (primary) hypertension: Secondary | ICD-10-CM | POA: Insufficient documentation

## 2020-05-22 DIAGNOSIS — Z79899 Other long term (current) drug therapy: Secondary | ICD-10-CM | POA: Insufficient documentation

## 2020-05-22 DIAGNOSIS — J45901 Unspecified asthma with (acute) exacerbation: Secondary | ICD-10-CM | POA: Insufficient documentation

## 2020-05-22 DIAGNOSIS — Z87891 Personal history of nicotine dependence: Secondary | ICD-10-CM | POA: Insufficient documentation

## 2020-05-22 DIAGNOSIS — J449 Chronic obstructive pulmonary disease, unspecified: Secondary | ICD-10-CM | POA: Diagnosis not present

## 2020-05-22 DIAGNOSIS — K5641 Fecal impaction: Secondary | ICD-10-CM | POA: Diagnosis not present

## 2020-05-22 LAB — CBC
HCT: 40.3 % (ref 39.0–52.0)
Hemoglobin: 13.4 g/dL (ref 13.0–17.0)
MCH: 31.3 pg (ref 26.0–34.0)
MCHC: 33.3 g/dL (ref 30.0–36.0)
MCV: 94.2 fL (ref 80.0–100.0)
Platelets: 194 10*3/uL (ref 150–400)
RBC: 4.28 MIL/uL (ref 4.22–5.81)
RDW: 13 % (ref 11.5–15.5)
WBC: 5.2 10*3/uL (ref 4.0–10.5)
nRBC: 0 % (ref 0.0–0.2)

## 2020-05-22 LAB — COMPREHENSIVE METABOLIC PANEL
ALT: 16 U/L (ref 0–44)
AST: 20 U/L (ref 15–41)
Albumin: 3.5 g/dL (ref 3.5–5.0)
Alkaline Phosphatase: 87 U/L (ref 38–126)
Anion gap: 7 (ref 5–15)
BUN: 14 mg/dL (ref 8–23)
CO2: 28 mmol/L (ref 22–32)
Calcium: 8.9 mg/dL (ref 8.9–10.3)
Chloride: 104 mmol/L (ref 98–111)
Creatinine, Ser: 0.92 mg/dL (ref 0.61–1.24)
GFR, Estimated: >60 mL/min (ref >60)
Glucose, Bld: 99 mg/dL (ref 70–99)
Potassium: 3.4 mmol/L — ABNORMAL LOW (ref 3.5–5.1)
Sodium: 139 mmol/L (ref 135–145)
Total Bilirubin: 1.3 mg/dL — ABNORMAL HIGH (ref 0.3–1.2)
Total Protein: 6.1 g/dL — ABNORMAL LOW (ref 6.5–8.1)

## 2020-05-22 LAB — URINALYSIS, ROUTINE W REFLEX MICROSCOPIC
Bacteria, UA: NONE SEEN
Bilirubin Urine: NEGATIVE
Glucose, UA: NEGATIVE mg/dL
Hgb urine dipstick: NEGATIVE
Ketones, ur: NEGATIVE mg/dL
Leukocytes,Ua: NEGATIVE
Nitrite: NEGATIVE
Protein, ur: 30 mg/dL — AB
Specific Gravity, Urine: 1.01 (ref 1.005–1.030)
pH: 6 (ref 5.0–8.0)

## 2020-05-22 MED ORDER — ONDANSETRON HCL 4 MG/2ML IJ SOLN
4.0000 mg | Freq: Once | INTRAMUSCULAR | Status: AC
  Administered 2020-05-22: 4 mg via INTRAVENOUS
  Filled 2020-05-22: qty 2

## 2020-05-22 MED ORDER — BISACODYL 10 MG RE SUPP
10.0000 mg | Freq: Once | RECTAL | Status: AC
  Administered 2020-05-22: 10 mg via RECTAL
  Filled 2020-05-22: qty 1

## 2020-05-22 MED ORDER — IOHEXOL 300 MG/ML  SOLN
100.0000 mL | Freq: Once | INTRAMUSCULAR | Status: AC | PRN
  Administered 2020-05-22: 100 mL via INTRAVENOUS

## 2020-05-22 MED ORDER — MILK AND MOLASSES ENEMA
1.0000 | Freq: Once | RECTAL | Status: AC
  Administered 2020-05-22: 240 mL via RECTAL
  Filled 2020-05-22: qty 240

## 2020-05-22 MED ORDER — MAGNESIUM CITRATE PO SOLN
1.0000 | Freq: Once | ORAL | Status: AC
  Administered 2020-05-22: 1 via ORAL

## 2020-05-22 MED ORDER — SODIUM CHLORIDE 0.9 % IV BOLUS
1000.0000 mL | Freq: Once | INTRAVENOUS | Status: AC
  Administered 2020-05-22: 1000 mL via INTRAVENOUS

## 2020-05-22 MED ORDER — MORPHINE SULFATE (PF) 4 MG/ML IV SOLN
4.0000 mg | Freq: Once | INTRAVENOUS | Status: AC
Start: 2020-05-22 — End: 2020-05-22
  Administered 2020-05-22: 4 mg via INTRAVENOUS
  Filled 2020-05-22: qty 1

## 2020-05-22 NOTE — ED Notes (Signed)
1/2 bottle of mag citrate taken so far. No bm yet

## 2020-05-22 NOTE — ED Notes (Signed)
Pt educated on foley care. Pt ambulatory and d/c to vehicle with d/c papers

## 2020-05-22 NOTE — ED Provider Notes (Signed)
[facility] EMERGENCY DEPARTMENT Provider Note   CSN: [id number] Arrival date & time: 05/22/20  4098     History Chief Complaint  Patient presents with  . Abdominal Pain    Jackson Martin is a 80 y.o. male.  Patient c/o lower abd pain in past few days. States has felt constipated for 1 month - pt unsure when last bm was. Symptoms acute onset, moderate, constant, dull, persistent, non radiating. Hx diverticulitis - pt unsure if same. No dysuria. No back or flank pain. No fever or chills. No vomiting.   The history is provided by the patient.  Abdominal Pain Associated symptoms: constipation   Associated symptoms: no chest pain, no chills, no cough, no dysuria, no fever, no shortness of breath, no sore throat and no vomiting        Past Medical History:  Diagnosis Date  . Anxiety   . Asthmatic bronchitis   . Atrial fibrillation ([place])   . Backache   . Colon polyps   . COPD (chronic obstructive pulmonary disease) ([place])   . Diverticulitis 12/2019  . Esophageal reflux   . Essential hypertension   . Gout   . Hemorrhoid   . Hyperlipidemia   . Insomnia   . Ischemic heart disease   . MI (myocardial infarction) ([place]) 2003  . Neck pain   . Plantar fasciitis   . Prostate cancer [facility])     Patient Active Problem List   Diagnosis Date Noted  . Diverticulitis 09/02/2019  . Blood in stool 09/23/2017  . Dyspnea on exertion 12/25/2016  . COPD  GOLD 0/ ? AB 12/25/2016  . Gastrointestinal hemorrhage 10/29/2016    Past Surgical History:  Procedure Laterality Date  . COLONOSCOPY    . COLONOSCOPY N/A 11/15/2016   Procedure: COLONOSCOPY;  Surgeon: Rogene Houston, MD;  Location: [facility] ENDO SUITE;  Service: Endoscopy;  Laterality: N/A;  1200  . COLONOSCOPY WITH PROPOFOL N/A 01/20/2020   Procedure: COLONOSCOPY WITH PROPOFOL;  Surgeon: Rogene Houston, MD;  Location: [facility] ENDO SUITE;  Service: Endoscopy;  Laterality: N/A;  1:15, pt refused to change time  . CORONARY ANGIOPLASTY WITH  STENT PLACEMENT  2007   4 stents placed  . POLYPECTOMY  11/15/2016   Procedure: POLYPECTOMY;  Surgeon: Rogene Houston, MD;  Location: [facility] ENDO SUITE;  Service: Endoscopy;;  colon  . POLYPECTOMY  01/20/2020   Procedure: POLYPECTOMY;  Surgeon: Rogene Houston, MD;  Location: [facility] ENDO SUITE;  Service: Endoscopy;;  . PROSTATECTOMY  2001       Family History  Problem Relation Age of Onset  . Hypertension Mother   . Heart disease Mother   . Heart disease Maternal Aunt   . Heart disease Maternal Uncle   . Heart disease Maternal Grandmother   . Heart disease Maternal Grandfather   . Colon cancer Brother   . Breast cancer Sister   . Lung cancer Sister   . Lung disease Sister     Social History   Tobacco Use  . Smoking status: Former Smoker    Packs/day: 0.75    Years: 3.00    Pack years: 2.25    Types: Cigarettes    Quit date: 03/06/1991    Years since quitting: 29.2  . Smokeless tobacco: Never Used  Vaping Use  . Vaping Use: Never used  Substance Use Topics  . Alcohol use: No  . Drug use: No    Home Medications Prior to Admission medications   Medication Sig Start  Date End Date Taking? Authorizing Provider  allopurinol (ZYLOPRIM) 300 MG tablet Take 300 mg by mouth daily.     [provider]  apixaban (ELIQUIS) 5 MG TABS tablet Take 1 tablet (5 mg total) by mouth 2 (two) times daily. 05/06/20   Satira Sark, MD  atorvastatin (LIPITOR) 20 MG tablet Take 20 mg by mouth at bedtime.     [provider]  budesonide-formoterol (SYMBICORT) 80-4.5 MCG/ACT inhaler Inhale 2 puffs into the lungs 2 (two) times daily. Patient taking differently: Inhale 2 puffs into the lungs 2 (two) times daily as needed (shortness of breath).  09/02/19   Elodia Florence., MD  citalopram (CELEXA) 20 MG tablet Take 20 mg by mouth daily.    [provider]  clonazePAM (KLONOPIN) 0.5 MG tablet Take 0.5 mg by mouth 3 (three) times daily.  10/19/16   [provider]   diltiazem (CARDIZEM CD) 240 MG 24 hr capsule Take 1 capsule by mouth once daily 01/26/20   Satira Sark, MD  Elastic Bandages & Supports ([place]) [place] 1 each by Does not apply route as directed. 1 pair of low pressure below the knee compression stockings Dx: mild orthostatic hypotension 04/21/19   Satira Sark, MD  Melatonin 10 MG TABS Take 10 mg by mouth at bedtime.     [provider]  montelukast (SINGULAIR) 10 MG tablet Take 10 mg by mouth at bedtime.    [provider]  sertraline (ZOLOFT) 100 MG tablet Take 100 mg by mouth daily as needed (anxiety).  12/04/19   [provider]    Allergies    Patient has no known allergies.  Review of Systems   Review of Systems  Constitutional: Negative for chills and fever.  HENT: Negative for sore throat.   Eyes: Negative for redness.  Respiratory: Negative for cough and shortness of breath.   Cardiovascular: Negative for chest pain.  Gastrointestinal: Positive for abdominal pain and constipation. Negative for vomiting.  Genitourinary: Negative for dysuria and flank pain.  Musculoskeletal: Negative for back pain and neck pain.  Skin: Negative for rash.  Neurological: Negative for headaches.  Hematological: Does not bruise/bleed easily.  Psychiatric/Behavioral: Negative for confusion.    Physical Exam Updated Vital Signs BP 140/81 (BP Location: Right Arm)   Pulse 85   Temp 97.9 F (36.6 C) (Oral)   Resp 13   Ht 1.753 m (5\' 9" )   Wt 73 kg   SpO2 96%   BMI 23.78 kg/m   Physical Exam Vitals and nursing note reviewed.  Constitutional:      Appearance: Normal appearance. He is well-developed.  HENT:     Head: Atraumatic.     Nose: Nose normal.     Mouth/Throat:     Mouth: Mucous membranes are moist.     Pharynx: Oropharynx is clear.  Eyes:     General: No scleral icterus.    Conjunctiva/sclera: Conjunctivae normal.  Neck:     Trachea: No tracheal deviation.   Cardiovascular:     Rate and Rhythm: Normal rate and regular rhythm.     Pulses: Normal pulses.     Heart sounds: Normal heart sounds. No murmur heard. No friction rub. No gallop.   Pulmonary:     Effort: Pulmonary effort is normal. No accessory muscle usage or respiratory distress.     Breath sounds: Normal breath sounds.  Abdominal:     General: Bowel sounds are normal. There is no distension.  Palpations: Abdomen is soft.     Tenderness: There is abdominal tenderness. There is no guarding.     Comments: Suprapubic and lower abd tenderness. No incarcerated hernia.   Genitourinary:    Comments: No cva tenderness. Musculoskeletal:        General: No swelling.     Cervical back: Normal range of motion and neck supple. No rigidity.  Skin:    General: Skin is warm and dry.     Findings: No rash.  Neurological:     Mental Status: He is alert.     Comments: Alert, speech clear.   Psychiatric:        Mood and Affect: Mood normal.     ED Results / Procedures / Treatments   Labs (all labs ordered are listed, but only abnormal results are displayed) Results for orders placed or performed during the hospital encounter of 05/22/20  CBC  Result Value Ref Range   WBC 5.2 4.0 - 10.5 K/uL   RBC 4.28 4.22 - 5.81 MIL/uL   Hemoglobin 13.4 13.0 - 17.0 g/dL   HCT 40.3 39.0 - 52.0 %   MCV 94.2 80.0 - 100.0 fL   MCH 31.3 26.0 - 34.0 pg   MCHC 33.3 30.0 - 36.0 g/dL   RDW 13.0 11.5 - 15.5 %   Platelets 194 150 - 400 K/uL   nRBC 0.0 0.0 - 0.2 %  Comprehensive metabolic panel  Result Value Ref Range   Sodium 139 135 - 145 mmol/L   Potassium 3.4 (L) 3.5 - 5.1 mmol/L   Chloride 104 98 - 111 mmol/L   CO2 28 22 - 32 mmol/L   Glucose, Bld 99 70 - 99 mg/dL   BUN 14 8 - 23 mg/dL   Creatinine, Ser 0.92 0.61 - 1.24 mg/dL   Calcium 8.9 8.9 - 10.3 mg/dL   Total Protein 6.1 (L) 6.5 - 8.1 g/dL   Albumin 3.5 3.5 - 5.0 g/dL   AST 20 15 - 41 U/L   ALT 16 0 - 44 U/L   Alkaline Phosphatase 87 38  - 126 U/L   Total Bilirubin 1.3 (H) 0.3 - 1.2 mg/dL   GFR, Estimated >60 >60 mL/min   Anion gap 7 5 - 15  Urinalysis, Routine w reflex microscopic Urine, Clean Catch  Result Value Ref Range   Color, Urine YELLOW YELLOW   APPearance CLEAR CLEAR   Specific Gravity, Urine 1.010 1.005 - 1.030   pH 6.0 5.0 - 8.0   Glucose, UA NEGATIVE NEGATIVE mg/dL   Hgb urine dipstick NEGATIVE NEGATIVE   Bilirubin Urine NEGATIVE NEGATIVE   Ketones, ur NEGATIVE NEGATIVE mg/dL   Protein, ur 30 (A) NEGATIVE mg/dL   Nitrite NEGATIVE NEGATIVE   Leukocytes,Ua NEGATIVE NEGATIVE   WBC, UA 0-5 0 - 5 WBC/hpf   Bacteria, UA NONE SEEN NONE SEEN   Squamous Epithelial / LPF 0-5 0 - 5   Mucus PRESENT     EKG None  Radiology CT Abdomen Pelvis W Contrast  Result Date: 05/22/2020 CLINICAL DATA:  80 year old male with lower abdominal pain beginning 1 month after colonoscopy. EXAM: CT ABDOMEN AND PELVIS WITH CONTRAST TECHNIQUE: Multidetector CT imaging of the abdomen and pelvis was performed using the standard protocol following bolus administration of intravenous contrast. CONTRAST:  175mL OMNIPAQUE IOHEXOL 300 MG/ML  SOLN COMPARISON:  CT Abdomen and Pelvis 09/02/2019 and earlier. FINDINGS: Lower chest: Stable, negative. Hepatobiliary: Stable, negative. Pancreas: Stable pancreatic atrophy. Spleen: Negative. Adrenals/Urinary Tract: Normal adrenal glands. Mild  bilateral hydronephrosis and hydroureter today with distended urinary bladder (535 mL) with mass effect from fecal impaction on the base of the bladder (sagittal image 63). There is symmetric renal contrast excretion on delayed images. No urinary calculus identified. Stomach/Bowel: Stool ball in the rectum up to 12 cm (sagittal image 63), new from prior. Redundant sigmoid colon with less retained stool, extensive diverticulosis from the splenic flexure to the sigmoid. No active inflammation today. Redundant transverse colon with occasional diverticula. Mostly decompressed  right colon also with occasional diverticula. Normal appendix on coronal image 52. There are diverticula also of the terminal ileum, no active inflammation. No dilated small bowel. Decompressed stomach and duodenum. No free air, free fluid. Vascular/Lymphatic: Aortoiliac calcified atherosclerosis. Major arterial structures remain patent. Portal venous system is patent. No lymphadenopathy. Reproductive: Negative. Other: No pelvic free fluid. Musculoskeletal: Stable. Osteopenia. No acute osseous abnormality identified. IMPRESSION: 1. Fecal impaction, with secondary obstruction of the bladder outlet. 12 cm stool ball in the rectum. 535 mL bladder with mild bilateral hydronephrosis and hydroureter. 2. Extensive large bowel diverticulosis, and occasional small bowel diverticula, but no active inflammation identified. 3. Aortic Atherosclerosis (ICD10-I70.0). Electronically Signed   By: Genevie Ann M.D.   On: 05/22/2020 10:14    Procedures Procedures   Medications Ordered in ED Medications  sodium chloride 0.9 % bolus 1,000 mL (has no administration in time range)  morphine 4 MG/ML injection 4 mg (has no administration in time range)  ondansetron (ZOFRAN) injection 4 mg (has no administration in time range)    ED Course  I have reviewed the triage vital signs and the nursing notes.  Pertinent labs & imaging results that were available during my care of the patient were reviewed by me and considered in my medical decision making (see chart for details).    MDM Rules/Calculators/A&P                         Iv ns. Morphine iv. zofran iv. Labs sent.  Reviewed nursing notes and prior charts for additional history.   Labs reviewed/interpreted by me - chem normal except k sl low. kcl po.   CT pending.  CT reviewed/interpreted by me - large fecal impaction, urine retention.   I attempted to manually disimpact (as much as patient would tolerate, allow) - moderate amount of soft formed medium brown stool  removed. No mass felt. Enema ordered - pt held in only briefly, and expelled mostly the enema solution, and relatively small amount stool. Dulcolax suppository placed.   Foley placed as pvr ~ 500 cc. Foley with 800 cc clear yellow urine in bag. Pt feels improved.   Pt indicates he feels bms not normal since his last colonoscopy (from chart appears 01/2020). States when he tried to follow up with his gi for these symptoms, he could not get through and/or secure appointment in office - gi consulted.   Discussed pt, hx, ct, etc. With Dr Sydell Axon - he indicates to have rn try additional 1-2 milk and molasses retention enemas in ED, and to start on colace and miralax, and to call Dr Otelia Limes office in AM.   1530, signed out to Dr Sabra Heck to f/u on enemas (he rec milk and molasses)/results, if patient has results, to d/c with close gi f/u.      Final Clinical Impression(s) / ED Diagnoses Final diagnoses:  None    Rx / DC Orders ED Discharge Orders    None  Lajean Saver, MD 05/22/20 [phone]

## 2020-05-22 NOTE — Discharge Instructions (Addendum)
It was our pleasure to provide your ER care today - we hope that you feel better.  Drink plenty of fluids/stay well hydrated. Get adequate fiber in diet. Take colace (stool softener) 2x/day. Take miralax (laxative) daily - these meds are available over the counter.   We discussed your case with our gi doctor on call - he indicates for you to call Dr Olevia Perches office tomorrow AM to arrange close follow up with him in the next 1-2 days.   Return to ER if worse, new symptoms, fevers, new, worsening or severe pain, vomiting, fevers, unable to void, or other concern.

## 2020-05-22 NOTE — ED Notes (Signed)
No bm at this time. Pt on phone with family

## 2020-05-22 NOTE — ED Provider Notes (Signed)
Patient has been given different medications including magnesium citrate, he has had some liquid stool, he is having productive bowel movements however it is mostly liquid, he has had a Foley catheter placed with good urinary output and this will stay in place until he is able to go home and have further good bowel movements.  He is agreeable to the plan, he wants to drive himself, I think that is reasonable since he has had no sedation.  At this time the patient does appear stable for discharge, vital signs are unremarkable, CT scan showed no other intra-abdominal pathology and the patient can follow-up with GI tomorrow.  He has an established relationship with Dr. Maxine Glenn, MD 05/22/20 2232

## 2020-05-22 NOTE — ED Triage Notes (Signed)
Pt presents to ED with complaints of lower abdominal cramping and constipation x 1 month. Last BM 1 month ago.

## 2020-05-23 ENCOUNTER — Ambulatory Visit: Payer: Medicare PPO | Admitting: Cardiology

## 2020-05-24 DIAGNOSIS — Z87891 Personal history of nicotine dependence: Secondary | ICD-10-CM | POA: Diagnosis not present

## 2020-05-24 DIAGNOSIS — I10 Essential (primary) hypertension: Secondary | ICD-10-CM | POA: Diagnosis not present

## 2020-05-24 DIAGNOSIS — Z299 Encounter for prophylactic measures, unspecified: Secondary | ICD-10-CM | POA: Diagnosis not present

## 2020-05-24 DIAGNOSIS — K5900 Constipation, unspecified: Secondary | ICD-10-CM | POA: Diagnosis not present

## 2020-05-24 DIAGNOSIS — R413 Other amnesia: Secondary | ICD-10-CM | POA: Diagnosis not present

## 2020-05-24 DIAGNOSIS — R338 Other retention of urine: Secondary | ICD-10-CM | POA: Diagnosis not present

## 2020-05-26 ENCOUNTER — Telehealth: Payer: Self-pay | Admitting: Internal Medicine

## 2020-05-26 NOTE — Telephone Encounter (Signed)
I called pt to schedule appt for referral and pt did not want to come at this time. Pt said he would call back to schedule if he needed to.

## 2020-05-31 ENCOUNTER — Other Ambulatory Visit: Payer: Self-pay

## 2020-05-31 ENCOUNTER — Ambulatory Visit (INDEPENDENT_AMBULATORY_CARE_PROVIDER_SITE_OTHER): Payer: Medicare PPO | Admitting: Internal Medicine

## 2020-05-31 ENCOUNTER — Encounter (INDEPENDENT_AMBULATORY_CARE_PROVIDER_SITE_OTHER): Payer: Self-pay | Admitting: Internal Medicine

## 2020-05-31 DIAGNOSIS — K644 Residual hemorrhoidal skin tags: Secondary | ICD-10-CM | POA: Insufficient documentation

## 2020-05-31 DIAGNOSIS — K5900 Constipation, unspecified: Secondary | ICD-10-CM | POA: Insufficient documentation

## 2020-05-31 DIAGNOSIS — K5909 Other constipation: Secondary | ICD-10-CM | POA: Insufficient documentation

## 2020-05-31 MED ORDER — METAMUCIL SMOOTH TEXTURE 58.6 % PO POWD: 1.0000 | Freq: Every day | ORAL | 5 refills | Status: DC

## 2020-05-31 MED ORDER — HYDROCORTISONE (PERIANAL) 1 % EX CREA: TOPICAL_CREAM | CUTANEOUS | 1 refills | Status: DC

## 2020-05-31 NOTE — Patient Instructions (Signed)
Take Metamucil 1 packet by mouth daily at bedtime Continue stool softener/Colace as before Can decrease polyethylene glycol to half a scoop or 8.5 g daily if full dose makes her stools loose.

## 2020-05-31 NOTE — Progress Notes (Signed)
Presenting complaint;  constipation.  Database and subjective:  Patient is 80 year old Caucasian male who has history of colonic adenomas.  His last colonoscopy was November 2021 with removal of 5 small polyps and these are tubular adenomas. Patient not returns for a new problem.  He states he began to have constipation in middle of December 2021.  He tried OTC laxatives but without symptomatic relief.  He also began to experience lower abdominal cramping.  As he was not feeling better he came to emergency room on 05/22/2020.  Evaluation in emergency room revealed him to be constipated with fecal impaction.  He was also having difficulty voiding.  Therefore Foley's catheter was placed.  Abdominal pelvic CT was obtained and confirmed fecal impaction.  He was given Dulcolax suppository and also given a bottle of mag citrate which she drank over a period of 2 hours.  Finally his bowels started to move.  He however did not get complete relief.  Patient states he was discharged around 18 PM.  He was not feeling well.  He tells me that he was very dizzy.  He requested not to be discharged.  He was willing to pay for his stay if his insurance would not cover.  Patient was given discharge papers and advised to go home.  Patient says he lives alone and was scared to drive home by himself.  He cannot see very well at nights.  Nevertheless he managed to do it.  He stopped at a gas station on the way.  He eventually made it home.  He had Foley's catheter remove by his primary care physician a day later.  He has been taking stool softener and polyethylene glycol and his bowels are moving but stool is loose.  He is having small amount of bowel movement every day.  His abdominal pain has improved.  He denies melena or rectal bleeding.  He says he has lost 10 pounds since his problem started.  He has not changed any of his medications.  He says he has not changed his diet either.  Patient says his upper dentures do not fit  well and he therefore has stopped using them. Patient says he did complain and talk with Amy at [facility]  He wants to have face-to-face meeting with the present of the hospital and wants to see Dr. Sabra Heck who treated him.  He says all the nursing staff is very good. He is having hard time getting over this experience.  Current Medications: Outpatient Encounter Medications as of 05/31/2020  Medication Sig  . allopurinol (ZYLOPRIM) 300 MG tablet Take 300 mg by mouth daily.   Marland Kitchen apixaban (ELIQUIS) 5 MG TABS tablet Take 1 tablet (5 mg total) by mouth 2 (two) times daily.  Marland Kitchen atorvastatin (LIPITOR) 20 MG tablet Take 20 mg by mouth at bedtime.   . budesonide-formoterol (SYMBICORT) 80-4.5 MCG/ACT inhaler Inhale 2 puffs into the lungs 2 (two) times daily. (Patient taking differently: Inhale 2 puffs into the lungs 2 (two) times daily as needed (shortness of breath).)  . citalopram (CELEXA) 20 MG tablet Take 20 mg by mouth daily.  . clonazePAM (KLONOPIN) 0.5 MG tablet Take 0.5 mg by mouth 3 (three) times daily.   Marland Kitchen diltiazem (CARDIZEM CD) 240 MG 24 hr capsule Take 1 capsule by mouth once daily  . docusate sodium (COLACE) 100 MG capsule Take 100 mg by mouth 2 (two) times daily. Patient states that he takes 1 in the morning and 1 at night.  Water engineer  Bandages & Supports (MEDICAL COMPRESSION STOCKINGS) MISC 1 each by Does not apply route as directed. 1 pair of low pressure below the knee compression stockings Dx: mild orthostatic hypotension  . Melatonin 10 MG TABS Take 10 mg by mouth at bedtime.   . montelukast (SINGULAIR) 10 MG tablet Take 10 mg by mouth at bedtime.  . polyethylene glycol (MIRALAX / GLYCOLAX) 17 g packet Take 17 g by mouth. Patient states that he takes occasionally with the Colace.  . sertraline (ZOLOFT) 100 MG tablet Take 100 mg by mouth daily as needed (anxiety).    No facility-administered encounter medications on file as of 05/31/2020.    Objective: Blood pressure 135/80, pulse (!) 109,  temperature 97.8 F (36.6 C), temperature source Oral, height '5\' 9"'  (1.753 m), weight 152 lb (68.9 kg). Patient is alert and in no acute distress. He is in tears.   He is wearing a mask. Conjunctiva is pink. Sclera is nonicteric Oropharyngeal mucosa is normal. He has few remaining teeth and lower jaw. No neck masses or thyromegaly noted. Cardiac exam with regular rhythm normal S1 and S2. No murmur or gallop noted. Lungs are clear to auscultation. Abdomen is symmetrical.  Bowel sounds are normal.  On palpation abdomen is soft.  He has mild tenderness in hypogastric region.  No organomegaly or masses. Rectal examination reveals anal tags 1 of which is with superficial ulceration but no bleeding.  Digital exam is unremarkable.  He has soft stool in the vault.  Stool is guaiac negative. No LE edema or clubbing noted.  Labs/studies Results:  CBC Latest Ref Rng & Units 05/22/2020 09/02/2019 03/31/2019  WBC 4.0 - 10.5 K/uL 5.2 9.0 5.6  Hemoglobin 13.0 - 17.0 g/dL 13.4 13.5 14.0  Hematocrit 39.0 - 52.0 % 40.3 41.1 42.8  Platelets 150 - 400 K/uL 194 187 250    CMP Latest Ref Rng & Units 05/22/2020 09/02/2019 03/31/2019  Glucose 70 - 99 mg/dL 99 114(H) 103(H)  BUN 8 - 23 mg/dL '14 9 8  ' Creatinine 0.61 - 1.24 mg/dL 0.92 1.11 0.93  Sodium 135 - 145 mmol/L 139 137 140  Potassium 3.5 - 5.1 mmol/L 3.4(L) 3.4(L) 3.5  Chloride 98 - 111 mmol/L 104 101 103  CO2 22 - 32 mmol/L '28 29 29  ' Calcium 8.9 - 10.3 mg/dL 8.9 8.7(L) 8.3(L)  Total Protein 6.5 - 8.1 g/dL 6.1(L) 6.5 -  Total Bilirubin 0.3 - 1.2 mg/dL 1.3(H) 1.3(H) -  Alkaline Phos 38 - 126 U/L 87 121 -  AST 15 - 41 U/L 20 16 -  ALT 0 - 44 U/L 16 10 -    Hepatic Function Latest Ref Rng & Units 05/22/2020 09/02/2019 09/28/2017  Total Protein 6.5 - 8.1 g/dL 6.1(L) 6.5 6.4(L)  Albumin 3.5 - 5.0 g/dL 3.5 3.4(L) 3.6  AST 15 - 41 U/L '20 16 20  ' ALT 0 - 44 U/L '16 10 11  ' Alk Phosphatase 38 - 126 U/L 87 121 109  Total Bilirubin 0.3 - 1.2 mg/dL 1.3(H) 1.3(H)  1.2    Lab data from recent ER visit reviewed. Abdominal pelvic CT reviewed as well. Mild bilateral hydronephrosis and hydroureter with distended urinary bladder with mass-effect from fecal impaction. Distended rectum containing stool ball.  Rectal measured 12 cm in sagittal section.  Multiple diverticula at colon with few involving small intestine.  Assessment:  #1.  Constipation.  Constipation started in mid December 2021.  He had colonoscopy because of history of colonic adenomas and no colonic stricture was  identified.  I suspect change in bowel habits secondary to his medication and perhaps eating habits because he is not able to use his upper dentures.  He would benefit from adding fiber supplement to his regiment.  #2.  Patient is extremely upset about this ER visit.  I feel this issue needs to be resolved so that he can move 1.  #3.  Anal tags and hemorrhoids with superficial ulceration.  Plan:  ProctoCream-HC to be applied to anal canal twice daily for 1 week and thereafter on as-needed basis. Patient advised to increase consumption of fiber rich foods. Metamucil 1 packet or 4 g by mouth daily at bedtime. He can back off on MiraLAX dose if stool remains loose. Ms. Irish Elders was contacted about the situation. Office visit in 1 month.

## 2020-06-02 ENCOUNTER — Ambulatory Visit (INDEPENDENT_AMBULATORY_CARE_PROVIDER_SITE_OTHER): Payer: Medicare PPO | Admitting: Internal Medicine

## 2020-06-20 ENCOUNTER — Ambulatory Visit: Payer: Medicare PPO | Admitting: Cardiology

## 2020-06-20 ENCOUNTER — Other Ambulatory Visit: Payer: Self-pay

## 2020-06-20 ENCOUNTER — Encounter: Payer: Self-pay | Admitting: Cardiology

## 2020-06-20 VITALS — BP 136/80 | HR 108 | Ht 69.0 in | Wt 149.8 lb

## 2020-06-20 DIAGNOSIS — I10 Essential (primary) hypertension: Secondary | ICD-10-CM | POA: Diagnosis not present

## 2020-06-20 DIAGNOSIS — I4819 Other persistent atrial fibrillation: Secondary | ICD-10-CM | POA: Diagnosis not present

## 2020-06-20 NOTE — Progress Notes (Signed)
Cardiology Office Note  Date: 06/20/2020   ID: Jackson Martin, DOB Jul 16, 1940, MRN [id number]  PCP:  Monico Blitz, MD  Cardiologist:  Rozann Lesches, MD Electrophysiologist:  None   Chief Complaint  Patient presents with  . Cardiac follow-up    History of Present Illness: Jackson Martin is an 80 y.o. male last seen in September 2021.  He presents for a follow-up visit.  From a cardiac perspective, he does not describe any progressive palpitations, does feel them intermittently however.  Heart rate was elevated when he first came in and had his ECG, but after several minutes settled down into the 90s on current dose of Cardizem CD.  I personally reviewed his ECG which shows atrial fibrillation, right bundle branch block and PVCs.  He states that he has been taking his medications regularly.  He reports no spontaneous bleeding problems on Eliquis, recent hemoglobin was 13.4.  He is following with Dr. Laural Golden, history of colonic polyps and constipation.  He tells me that his sister passed away about 2 weeks ago with lung cancer, he is going through the grieving process.  Past Medical History:  Diagnosis Date  . Anxiety   . Asthmatic bronchitis   . Atrial fibrillation ([place])   . Backache   . Colon polyps   . COPD (chronic obstructive pulmonary disease) ([place])   . Diverticulitis 12/2019  . Esophageal reflux   . Essential hypertension   . Gout   . Hemorrhoid   . Hyperlipidemia   . Insomnia   . Ischemic heart disease   . MI (myocardial infarction) ([place]) 2003  . Neck pain   . Plantar fasciitis   . Prostate cancer [facility])     Past Surgical History:  Procedure Laterality Date  . COLONOSCOPY    . COLONOSCOPY N/A 11/15/2016   Procedure: COLONOSCOPY;  Surgeon: Rogene Houston, MD;  [facility];  Service: Endoscopy;  Laterality: N/A;  1200  . COLONOSCOPY WITH PROPOFOL N/A 01/20/2020   Procedure: COLONOSCOPY WITH PROPOFOL;  Surgeon: Rogene Houston, MD;  [facility]  [facility];  Service: Endoscopy;  Laterality: N/A;  1:15, pt refused to change time  . CORONARY ANGIOPLASTY WITH STENT PLACEMENT  2007   4 stents placed  . POLYPECTOMY  11/15/2016   Procedure: POLYPECTOMY;  Surgeon: Rogene Houston, MD;  [facility];  Service: Endoscopy;;  colon  . POLYPECTOMY  01/20/2020   Procedure: POLYPECTOMY;  Surgeon: Rogene Houston, MD;  [facility];  Service: Endoscopy;;  . PROSTATECTOMY  2001    Current Outpatient Medications  Medication Sig Dispense Refill  . allopurinol (ZYLOPRIM) 300 MG tablet Take 300 mg by mouth daily.     Marland Kitchen apixaban (ELIQUIS) 5 MG TABS tablet Take 1 tablet (5 mg total) by mouth 2 (two) times daily. 56 tablet 0  . atorvastatin (LIPITOR) 20 MG tablet Take 20 mg by mouth at bedtime.     . budesonide-formoterol (SYMBICORT) 80-4.5 MCG/ACT inhaler Inhale 2 puffs into the lungs 2 (two) times daily. (Patient taking differently: Inhale 2 puffs into the lungs 2 (two) times daily as needed (shortness of breath).) 1 Inhaler 11  . citalopram (CELEXA) 20 MG tablet Take 20 mg by mouth daily.    . clonazePAM (KLONOPIN) 0.5 MG tablet Take 0.5 mg by mouth 3 (three) times daily.     Marland Kitchen diltiazem (CARDIZEM CD) 240 MG 24 hr capsule Take 1 capsule by mouth once daily 90 capsule 3  .  docusate sodium (COLACE) 100 MG capsule Take 100 mg by mouth 2 (two) times daily. Patient states that he takes 1 in the morning and 1 at night.    Regino Schultze Bandages & Supports (MEDICAL COMPRESSION STOCKINGS) MISC 1 each by Does not apply route as directed. 1 pair of low pressure below the knee compression stockings Dx: mild orthostatic hypotension 2 each 0  . Hydrocortisone, Perianal, (PROCTO-PAK) 1 % CREA Apply to anal canal twice daily for 2 weeks and thereafter as needed. 28 g 1  . Melatonin 10 MG TABS Take 10 mg by mouth at bedtime.     . montelukast (SINGULAIR) 10 MG tablet Take 10 mg by mouth at bedtime.    . polyethylene glycol (MIRALAX / GLYCOLAX) 17  g packet Take 17 g by mouth. Patient states that he takes occasionally with the Colace.    . psyllium (METAMUCIL SMOOTH TEXTURE) 58.6 % powder Take 1 packet by mouth at bedtime. 283 g 5  . sertraline (ZOLOFT) 100 MG tablet Take 100 mg by mouth daily as needed (anxiety).      No current facility-administered medications for this visit.   Allergies:  Patient has no known allergies.   ROS: No syncope.  Physical Exam: VS:  BP 136/80   Pulse (!) 108   Ht 5\' 9"  (1.753 m)   Wt 149 lb 12.8 oz (67.9 kg)   SpO2 95%   BMI 22.12 kg/m , BMI Body mass index is 22.12 kg/m.  Wt Readings from Last 3 Encounters:  06/20/20 149 lb 12.8 oz (67.9 kg)  05/31/20 152 lb (68.9 kg)  05/22/20 161 lb (73 kg)    General: Elderly male, no distress. HEENT: Conjunctiva and lids normal, wearing a mask. Neck: Supple, no elevated JVP or carotid bruits, no thyromegaly. Lungs: Clear to auscultation, nonlabored breathing at rest. Cardiac: Irregularly irregular, no S3, soft systolic murmur. Extremities: No pitting edema.  ECG:  An ECG dated 04/21/2019 was personally reviewed today and demonstrated:  Atrial fibrillation with right lower branch block and PVC.  Recent Labwork: 05/22/2020: ALT 16; AST 20; BUN 14; Creatinine, Ser 0.92; Hemoglobin 13.4; Platelets 194; Potassium 3.4; Sodium 139   Other Studies Reviewed Today:  Echocardiogram 08/06/2016 [facility]): Normal LV wall thickness with LVEF 60-65%, normal right ventricular contraction, mild left atrial enlargement, mildly sclerotic aortic valve without stenosis, mild mitral regurgitation, mild tricuspid regurgitation, no pericardial effusion.  Assessment and Plan:  1.  Permanent atrial fibrillation with CHA2DS2-VASc score of 4.  Heart rate elevated today when he first came in, but did settle down into the 90s at rest.  He reports compliance with Cardizem CD 240 mg daily, also Eliquis for stroke prophylaxis.  We will continue to follow for now, could  consider increasing Cardizem CD if necessary or even addition of a low-dose beta-blocker.  2.  Essential hypertension, systolic 161W today, no changes to current regimen.  Keep follow-up with Dr. Manuella Ghazi.  Medication Adjustments/Labs and Tests Ordered: Current medicines are reviewed at length with the patient today.  Concerns regarding medicines are outlined above.   Tests Ordered: Orders Placed This Encounter  Procedures  . EKG 12-Lead    Medication Changes: No orders of the defined types were placed in this encounter.   Disposition:  Follow up 6 months.  Signed, Satira Sark, MD, [facility] 06/20/2020 4:32 PM    [place] at [place], [place], [place] [address] Phone: [phone]; Fax: [phone]

## 2020-06-20 NOTE — Patient Instructions (Signed)
Medication Instructions:  Continue all current medications.   Labwork: none  Testing/Procedures: none  Follow-Up: 6 months   Any Other Special Instructions Will Be Listed Below (If Applicable).   If you need a refill on your cardiac medications before your next appointment, please call your pharmacy.  

## 2020-07-12 ENCOUNTER — Ambulatory Visit: Payer: Medicare PPO | Admitting: Urology

## 2020-07-14 ENCOUNTER — Ambulatory Visit (INDEPENDENT_AMBULATORY_CARE_PROVIDER_SITE_OTHER): Payer: Medicare PPO | Admitting: Internal Medicine

## 2020-07-26 ENCOUNTER — Other Ambulatory Visit: Payer: Self-pay

## 2020-07-26 ENCOUNTER — Ambulatory Visit (INDEPENDENT_AMBULATORY_CARE_PROVIDER_SITE_OTHER): Payer: Medicare PPO | Admitting: Internal Medicine

## 2020-07-26 ENCOUNTER — Encounter (INDEPENDENT_AMBULATORY_CARE_PROVIDER_SITE_OTHER): Payer: Self-pay | Admitting: Internal Medicine

## 2020-07-26 VITALS — BP 150/71 | HR 76 | Temp 97.8°F | Ht 69.0 in | Wt 148.5 lb

## 2020-07-26 DIAGNOSIS — K5900 Constipation, unspecified: Secondary | ICD-10-CM

## 2020-07-26 MED ORDER — LINACLOTIDE 72 MCG PO CAPS: 72.0000 ug | ORAL_CAPSULE | Freq: Every day | ORAL | 0 refills | Status: DC

## 2020-07-26 NOTE — Patient Instructions (Signed)
Please call office with progress report in 4 weeks. 

## 2020-07-26 NOTE — Progress Notes (Signed)
Presenting complaint;  Follow-up for constipation.  Database and subjective:  Patient is 80 year old Caucasian male who is here for scheduled visit.  He he has a history of constipation.  He was last seen on 05/31/2020.  His constipation started about 5 months ago.  He has a history of colonic polyps and is up-to-date on surveillance protocol.  Last colonoscopy was in November 2021 with removal of 5 small tubular adenomas. Patient was advised to increase intake of fiber rich foods take Metamucil 4 g daily and use polyethylene glycol daily at a low dose. On his last visit he was extremely upset about his ER visit few weeks earlier.  I did talk with Ms. Irish Elders VP of nursing and patient states he has received an apology letter with still not happy with the response.  Once again he says everybody was super nice except health provider.  He still would not like to go.  He says current regiment is not working.  His bowels move every 3 days.  He has to strain.  He also complains of soreness in either side of his abdomen.  He does not have a good appetite.  He has lost 4 pounds since his last visit.  He is trying to drink 1 protein shake every day.  Each shake is 30 g of protein.  He lives alone.  He usually prepares his meals himself.  He does not have any children.  He never married.  He lost his sister 3 months ago.  He has not been able to get out and be among friends because of COVID.  He is on antidepressant.  Patient says he had sausage biscuit from [employer] and coffee this morning.  He had chicken salad sandwich [employer] and oatmeal cookie last night.  He does not remember what he had at lunch yesterday.  Current Medications: Outpatient Encounter Medications as of 07/26/2020  Medication Sig  . allopurinol (ZYLOPRIM) 300 MG tablet Take 300 mg by mouth daily.   Marland Kitchen apixaban (ELIQUIS) 5 MG TABS tablet Take 1 tablet (5 mg total) by mouth 2 (two) times daily.  Marland Kitchen atorvastatin (LIPITOR) 20 MG tablet Take  20 mg by mouth at bedtime.   . budesonide-formoterol (SYMBICORT) 80-4.5 MCG/ACT inhaler Inhale 2 puffs into the lungs 2 (two) times daily. (Patient taking differently: Inhale 2 puffs into the lungs 2 (two) times daily as needed (shortness of breath).)  . citalopram (CELEXA) 20 MG tablet Take 20 mg by mouth daily.  . clonazePAM (KLONOPIN) 0.5 MG tablet Take 0.5 mg by mouth 3 (three) times daily.   Marland Kitchen diltiazem (CARDIZEM CD) 240 MG 24 hr capsule Take 1 capsule by mouth once daily  . docusate sodium (COLACE) 100 MG capsule Take 100 mg by mouth 2 (two) times daily. Patient states that he takes 1 in the morning and 1 at night.  Regino Schultze Bandages & Supports (MEDICAL COMPRESSION STOCKINGS) MISC 1 each by Does not apply route as directed. 1 pair of low pressure below the knee compression stockings Dx: mild orthostatic hypotension  . Hydrocortisone, Perianal, (PROCTO-PAK) 1 % CREA Apply to anal canal twice daily for 2 weeks and thereafter as needed.  . Melatonin 10 MG TABS Take 10 mg by mouth at bedtime.   . montelukast (SINGULAIR) 10 MG tablet Take 10 mg by mouth at bedtime.  . polyethylene glycol (MIRALAX / GLYCOLAX) 17 g packet Take 17 g by mouth daily.  . sertraline (ZOLOFT) 100 MG tablet Take 100 mg by mouth daily  as needed (anxiety).   . psyllium (METAMUCIL SMOOTH TEXTURE) 58.6 % powder Take 1 packet by mouth at bedtime. (Patient not taking: Reported on 07/26/2020)   No facility-administered encounter medications on file as of 07/26/2020.     Objective: Blood pressure (!) 150/71, pulse 76, temperature 97.8 F (36.6 C), temperature source Oral, height 5\' 9"  (1.753 m), weight 148 lb 8 oz (67.4 kg). Patient is alert and in no acute distress. He is wearing a mask. Conjunctiva is pink. Sclera is nonicteric Oropharyngeal mucosa is normal. No neck masses or thyromegaly noted. Cardiac exam with regular rhythm normal S1 and S2. No murmur or gallop noted. Lungs are clear to auscultation. Abdomen is  symmetrical.  Bowel sounds are normal.  Abdomen is soft.  He has mild vague tenderness in mid abdomen along the midaxillary line.  No guarding.  No organomegaly or masses. No LE edema or clubbing noted.   Assessment:  #1.  Constipation.  He is not responding to to therapy.  I am afraid his fiber intake is far from sufficient.  He just had colonoscopy in November 2021 and did not have stricture or megacolon.  #2.  Weight loss appears to be due to diminished oral intake as he lives alone.  I am also concerned that his depression is not well controlled with therapy and he needs to follow-up with Dr. Manuella Ghazi.   Plan:  Discontinue Colace and polyethylene glycol. High-fiber diet.  Patient encouraged to eat oatmeal and raisin brands are similar cereals at breakfast.  He should also consider eating apple with skin daily if he can.  Similarly he could add snacks consisting of almonds walnuts or cashews. Begin Linzess/mL appetite 72 mcg p.o. every morning. Patient was given 1 month supply of samples. Patient will call with progress report.  If this medication works we will call in a prescription. Office visit in 6 months.

## 2020-07-28 DIAGNOSIS — Z299 Encounter for prophylactic measures, unspecified: Secondary | ICD-10-CM | POA: Diagnosis not present

## 2020-07-28 DIAGNOSIS — I10 Essential (primary) hypertension: Secondary | ICD-10-CM | POA: Diagnosis not present

## 2020-07-28 DIAGNOSIS — H6123 Impacted cerumen, bilateral: Secondary | ICD-10-CM | POA: Diagnosis not present

## 2020-07-28 DIAGNOSIS — F419 Anxiety disorder, unspecified: Secondary | ICD-10-CM | POA: Diagnosis not present

## 2020-07-28 DIAGNOSIS — J449 Chronic obstructive pulmonary disease, unspecified: Secondary | ICD-10-CM | POA: Diagnosis not present

## 2020-07-28 DIAGNOSIS — K5909 Other constipation: Secondary | ICD-10-CM | POA: Diagnosis not present

## 2020-08-15 ENCOUNTER — Other Ambulatory Visit (INDEPENDENT_AMBULATORY_CARE_PROVIDER_SITE_OTHER): Payer: Self-pay | Admitting: Gastroenterology

## 2020-08-15 ENCOUNTER — Telehealth (INDEPENDENT_AMBULATORY_CARE_PROVIDER_SITE_OTHER): Payer: Self-pay

## 2020-08-15 DIAGNOSIS — K5900 Constipation, unspecified: Secondary | ICD-10-CM

## 2020-08-15 MED ORDER — LINACLOTIDE 72 MCG PO CAPS
72.0000 ug | ORAL_CAPSULE | Freq: Every day | ORAL | 3 refills | Status: DC
Start: 2020-08-15 — End: 2020-11-24

## 2020-08-15 NOTE — Telephone Encounter (Signed)
Last seen 07/26/2020 abd started on Linzezz 72 mcg. Patient is needing refills of this sent to [facility] to place on file .

## 2020-08-15 NOTE — Telephone Encounter (Signed)
Medication sent.

## 2020-08-25 ENCOUNTER — Telehealth: Payer: Self-pay | Admitting: Cardiology

## 2020-08-25 MED ORDER — ELIQUIS 5 MG PO TABS: 5.0000 mg | ORAL_TABLET | Freq: Two times a day (BID) | ORAL | 0 refills | Status: DC

## 2020-08-25 NOTE — Telephone Encounter (Signed)
Advised that eliquis samples are available for pick up.

## 2020-08-25 NOTE — Telephone Encounter (Signed)
Gave $25 gift card to get #30 pills from [place].

## 2020-08-25 NOTE — Telephone Encounter (Signed)
Patient calling the office for samples of medication:   1.  What medication and dosage are you requesting samples for?ELIQUIS 5 MG   2.  Are you currently out of this medication? NO

## 2020-08-25 NOTE — Telephone Encounter (Signed)
Per [facility] pharmacist, patient has spent a total of $250.00 so far this year.  3% total was $457.39 Now need to spend $207.39 to get eliquis free from [facility].

## 2020-09-02 DIAGNOSIS — N400 Enlarged prostate without lower urinary tract symptoms: Secondary | ICD-10-CM | POA: Diagnosis not present

## 2020-09-02 DIAGNOSIS — R350 Frequency of micturition: Secondary | ICD-10-CM | POA: Diagnosis not present

## 2020-09-02 DIAGNOSIS — Z299 Encounter for prophylactic measures, unspecified: Secondary | ICD-10-CM | POA: Diagnosis not present

## 2020-09-02 DIAGNOSIS — I10 Essential (primary) hypertension: Secondary | ICD-10-CM | POA: Diagnosis not present

## 2020-09-07 DIAGNOSIS — L0100 Impetigo, unspecified: Secondary | ICD-10-CM | POA: Diagnosis not present

## 2020-09-07 DIAGNOSIS — C44319 Basal cell carcinoma of skin of other parts of face: Secondary | ICD-10-CM | POA: Diagnosis not present

## 2020-09-07 DIAGNOSIS — C4441 Basal cell carcinoma of skin of scalp and neck: Secondary | ICD-10-CM | POA: Diagnosis not present

## 2020-09-07 DIAGNOSIS — D485 Neoplasm of uncertain behavior of skin: Secondary | ICD-10-CM | POA: Diagnosis not present

## 2020-09-07 DIAGNOSIS — L821 Other seborrheic keratosis: Secondary | ICD-10-CM | POA: Diagnosis not present

## 2020-09-07 DIAGNOSIS — L309 Dermatitis, unspecified: Secondary | ICD-10-CM | POA: Diagnosis not present

## 2020-09-21 DIAGNOSIS — L0100 Impetigo, unspecified: Secondary | ICD-10-CM | POA: Diagnosis not present

## 2020-09-21 DIAGNOSIS — L309 Dermatitis, unspecified: Secondary | ICD-10-CM | POA: Diagnosis not present

## 2020-09-26 ENCOUNTER — Encounter: Payer: Self-pay | Admitting: *Deleted

## 2020-09-26 NOTE — Progress Notes (Signed)
Patient brought updated proof of prescription out of pocket expense for 2022 to office Spent $85.22 since 08/25/2020 Was total of $207.39 Now need to spend a total of $122.17 Will fax updated out of pocket paid expense to [facility].

## 2020-09-28 ENCOUNTER — Encounter: Payer: Self-pay | Admitting: *Deleted

## 2020-09-28 NOTE — Progress Notes (Signed)
Fax notification received from [facility] that eliquis 5 mg approved from 09/27/2020 through 03/04/2021.

## 2020-10-10 DIAGNOSIS — Z1331 Encounter for screening for depression: Secondary | ICD-10-CM | POA: Diagnosis not present

## 2020-10-10 DIAGNOSIS — Z6821 Body mass index (BMI) 21.0-21.9, adult: Secondary | ICD-10-CM | POA: Diagnosis not present

## 2020-10-10 DIAGNOSIS — Z299 Encounter for prophylactic measures, unspecified: Secondary | ICD-10-CM | POA: Diagnosis not present

## 2020-10-10 DIAGNOSIS — Z0000 Encounter for general adult medical examination without abnormal findings: Secondary | ICD-10-CM | POA: Diagnosis not present

## 2020-10-10 DIAGNOSIS — I4891 Unspecified atrial fibrillation: Secondary | ICD-10-CM | POA: Diagnosis not present

## 2020-10-10 DIAGNOSIS — Z7189 Other specified counseling: Secondary | ICD-10-CM | POA: Diagnosis not present

## 2020-10-10 DIAGNOSIS — Z1339 Encounter for screening examination for other mental health and behavioral disorders: Secondary | ICD-10-CM | POA: Diagnosis not present

## 2020-10-14 DIAGNOSIS — F419 Anxiety disorder, unspecified: Secondary | ICD-10-CM | POA: Diagnosis not present

## 2020-10-14 DIAGNOSIS — E7800 Pure hypercholesterolemia, unspecified: Secondary | ICD-10-CM | POA: Diagnosis not present

## 2020-10-14 DIAGNOSIS — Z79899 Other long term (current) drug therapy: Secondary | ICD-10-CM | POA: Diagnosis not present

## 2020-10-14 DIAGNOSIS — Z125 Encounter for screening for malignant neoplasm of prostate: Secondary | ICD-10-CM | POA: Diagnosis not present

## 2020-10-24 DIAGNOSIS — I480 Paroxysmal atrial fibrillation: Secondary | ICD-10-CM | POA: Diagnosis not present

## 2020-10-27 DIAGNOSIS — C4441 Basal cell carcinoma of skin of scalp and neck: Secondary | ICD-10-CM | POA: Diagnosis not present

## 2020-11-10 DIAGNOSIS — C44319 Basal cell carcinoma of skin of other parts of face: Secondary | ICD-10-CM | POA: Diagnosis not present

## 2020-11-23 ENCOUNTER — Telehealth (INDEPENDENT_AMBULATORY_CARE_PROVIDER_SITE_OTHER): Payer: Self-pay | Admitting: *Deleted

## 2020-11-23 NOTE — Telephone Encounter (Signed)
Pt last seen 07/26/20 for constipation. Called today states he tried linzess for 3 -4 days and stopped because it is not working. States since having colonoscopy 4 -6 weeks ago with dr Laural Golden his constipation has gotten worse. Not tried anything else for constipation since trying linzess for 3 -4 days. He states for past day or 2 when he stands he feels like he has to have a BM but unable and only able to urinate. Pt states when asked if he had any abdominal pain. He said no then said a little soreness in left lower side. He is not sure when it started.  Pt advised dr Laural Golden is out of office today and pt states he wants his message to go only to dr Laural Golden. Advised pt he should call pcp if he is having pain. Pt declined. States he wants message to go to dr Laural Golden to review tomorrow. Advised pt I would send back message but if pain got worse he should go to ED.   [phone]

## 2020-11-24 ENCOUNTER — Other Ambulatory Visit (INDEPENDENT_AMBULATORY_CARE_PROVIDER_SITE_OTHER): Payer: Self-pay

## 2020-11-24 ENCOUNTER — Other Ambulatory Visit (INDEPENDENT_AMBULATORY_CARE_PROVIDER_SITE_OTHER): Payer: Self-pay | Admitting: *Deleted

## 2020-11-24 MED ORDER — LUBIPROSTONE 24 MCG PO CAPS: 24.0000 ug | ORAL_CAPSULE | Freq: Two times a day (BID) | ORAL | 2 refills | Status: DC

## 2020-11-24 NOTE — Telephone Encounter (Signed)
Per dr Laural Golden - start amitiza 24 mcg one bid may drop down to one daily if too much #60 with 2 refills and pt should see pcp or urgent care today for rectal exam to make sure not impacted. Called and discussed all with pt. Pt verbalized understanding of all and med was sent to pharm.

## 2020-11-25 DIAGNOSIS — I4891 Unspecified atrial fibrillation: Secondary | ICD-10-CM | POA: Diagnosis not present

## 2020-11-25 DIAGNOSIS — K5900 Constipation, unspecified: Secondary | ICD-10-CM | POA: Diagnosis not present

## 2020-11-25 DIAGNOSIS — Z299 Encounter for prophylactic measures, unspecified: Secondary | ICD-10-CM | POA: Diagnosis not present

## 2020-11-25 DIAGNOSIS — I10 Essential (primary) hypertension: Secondary | ICD-10-CM | POA: Diagnosis not present

## 2020-12-12 DIAGNOSIS — G319 Degenerative disease of nervous system, unspecified: Secondary | ICD-10-CM | POA: Diagnosis not present

## 2020-12-12 DIAGNOSIS — I6789 Other cerebrovascular disease: Secondary | ICD-10-CM | POA: Diagnosis not present

## 2020-12-12 DIAGNOSIS — S7002XA Contusion of left hip, initial encounter: Secondary | ICD-10-CM | POA: Diagnosis not present

## 2020-12-12 DIAGNOSIS — W1839XA Other fall on same level, initial encounter: Secondary | ICD-10-CM | POA: Diagnosis not present

## 2020-12-12 DIAGNOSIS — Z7901 Long term (current) use of anticoagulants: Secondary | ICD-10-CM | POA: Diagnosis not present

## 2020-12-12 DIAGNOSIS — I2510 Atherosclerotic heart disease of native coronary artery without angina pectoris: Secondary | ICD-10-CM | POA: Diagnosis not present

## 2020-12-12 DIAGNOSIS — J449 Chronic obstructive pulmonary disease, unspecified: Secondary | ICD-10-CM | POA: Diagnosis not present

## 2020-12-12 DIAGNOSIS — S5002XA Contusion of left elbow, initial encounter: Secondary | ICD-10-CM | POA: Diagnosis not present

## 2020-12-12 DIAGNOSIS — M25562 Pain in left knee: Secondary | ICD-10-CM | POA: Diagnosis not present

## 2020-12-12 DIAGNOSIS — I6782 Cerebral ischemia: Secondary | ICD-10-CM | POA: Diagnosis not present

## 2020-12-12 DIAGNOSIS — M25552 Pain in left hip: Secondary | ICD-10-CM | POA: Diagnosis not present

## 2020-12-12 DIAGNOSIS — S8002XA Contusion of left knee, initial encounter: Secondary | ICD-10-CM | POA: Diagnosis not present

## 2020-12-12 DIAGNOSIS — M25522 Pain in left elbow: Secondary | ICD-10-CM | POA: Diagnosis not present

## 2020-12-12 DIAGNOSIS — M1712 Unilateral primary osteoarthritis, left knee: Secondary | ICD-10-CM | POA: Diagnosis not present

## 2020-12-12 DIAGNOSIS — I482 Chronic atrial fibrillation, unspecified: Secondary | ICD-10-CM | POA: Diagnosis not present

## 2020-12-16 DIAGNOSIS — Z6822 Body mass index (BMI) 22.0-22.9, adult: Secondary | ICD-10-CM | POA: Diagnosis not present

## 2020-12-16 DIAGNOSIS — M25552 Pain in left hip: Secondary | ICD-10-CM | POA: Diagnosis not present

## 2020-12-16 DIAGNOSIS — Z299 Encounter for prophylactic measures, unspecified: Secondary | ICD-10-CM | POA: Diagnosis not present

## 2020-12-16 DIAGNOSIS — J441 Chronic obstructive pulmonary disease with (acute) exacerbation: Secondary | ICD-10-CM | POA: Diagnosis not present

## 2020-12-16 DIAGNOSIS — J45909 Unspecified asthma, uncomplicated: Secondary | ICD-10-CM | POA: Diagnosis not present

## 2021-01-03 ENCOUNTER — Encounter: Payer: Self-pay | Admitting: *Deleted

## 2021-01-11 ENCOUNTER — Ambulatory Visit: Payer: Medicare PPO | Admitting: Cardiology

## 2021-01-11 ENCOUNTER — Encounter: Payer: Self-pay | Admitting: Cardiology

## 2021-01-11 VITALS — BP 120/62 | HR 67 | Ht 69.0 in | Wt 151.6 lb

## 2021-01-11 DIAGNOSIS — I4819 Other persistent atrial fibrillation: Secondary | ICD-10-CM

## 2021-01-11 DIAGNOSIS — I10 Essential (primary) hypertension: Secondary | ICD-10-CM | POA: Diagnosis not present

## 2021-01-11 NOTE — Progress Notes (Signed)
Cardiology Office Note  Date: 01/11/2021   ID: Jackson Martin, DOB Apr 14, 1940, MRN [id number]  PCP:  Monico Blitz, MD  Cardiologist:  Rozann Lesches, MD Electrophysiologist:  None   Chief Complaint  Patient presents with   Cardiac follow-up    History of Present Illness: Jackson Martin is an 80 y.o. male last seen in April.  He is here for a routine visit.  He does not report any active palpitations and states that he has been taking his medications regularly.  He continues to follow with Dr. Manuella Ghazi for primary care.  I reviewed his recent lab work from October as outlined below.  He does not report any bleeding problems on Eliquis and his heart rate is well controlled today on Cardizem CD.  Past Medical History:  Diagnosis Date   Anxiety    Asthmatic bronchitis    Atrial fibrillation ([facility])    Backache    Colon polyps    COPD (chronic obstructive pulmonary disease) ([place])    Diverticulitis 12/2019   Esophageal reflux    Essential hypertension    Gout    Hemorrhoid    Hyperlipidemia    Insomnia    Ischemic heart disease    MI (myocardial infarction) ([place]) 2003   Neck pain    Plantar fasciitis    Prostate cancer [facility])     Past Surgical History:  Procedure Laterality Date   COLONOSCOPY     COLONOSCOPY N/A 11/15/2016   Procedure: COLONOSCOPY;  Surgeon: Rogene Houston, MD;  [facility];  Service: Endoscopy;  Laterality: N/A;  1200   COLONOSCOPY WITH PROPOFOL N/A 01/20/2020   Procedure: COLONOSCOPY WITH PROPOFOL;  Surgeon: Rogene Houston, MD;  [facility];  Service: Endoscopy;  Laterality: N/A;  1:15, pt refused to change time   CORONARY ANGIOPLASTY WITH STENT PLACEMENT  2007   4 stents placed   POLYPECTOMY  11/15/2016   Procedure: POLYPECTOMY;  Surgeon: Rogene Houston, MD;  [facility];  Service: Endoscopy;;  colon   POLYPECTOMY  01/20/2020   Procedure: POLYPECTOMY;  Surgeon: Rogene Houston, MD;  [facility];   Service: Endoscopy;;   PROSTATECTOMY  2001    Current Outpatient Medications  Medication Sig Dispense Refill   allopurinol (ZYLOPRIM) 300 MG tablet Take 300 mg by mouth daily.      apixaban (ELIQUIS) 5 MG TABS tablet Take 1 tablet (5 mg total) by mouth 2 (two) times daily. 42 tablet 0   atorvastatin (LIPITOR) 20 MG tablet Take 20 mg by mouth at bedtime.      budesonide-formoterol (SYMBICORT) 80-4.5 MCG/ACT inhaler Inhale 2 puffs into the lungs 2 (two) times daily. (Patient taking differently: Inhale 2 puffs into the lungs 2 (two) times daily as needed (shortness of breath).) 1 Inhaler 11   citalopram (CELEXA) 20 MG tablet Take 20 mg by mouth daily.     clonazePAM (KLONOPIN) 0.5 MG tablet Take 0.5 mg by mouth 3 (three) times daily.      diltiazem (CARDIZEM CD) 240 MG 24 hr capsule Take 1 capsule by mouth once daily 90 capsule 3   Elastic Bandages & Supports (MEDICAL COMPRESSION STOCKINGS) MISC 1 each by Does not apply route as directed. 1 pair of low pressure below the knee compression stockings Dx: mild orthostatic hypotension 2 each 0   Hydrocortisone, Perianal, (PROCTO-PAK) 1 % CREA Apply to anal canal twice daily for 2 weeks and thereafter as needed. 28 g 1  lubiprostone (AMITIZA) 24 MCG capsule Take 1 capsule (24 mcg total) by mouth 2 (two) times daily with a meal. 60 capsule 2   Melatonin 10 MG TABS Take 10 mg by mouth at bedtime.      montelukast (SINGULAIR) 10 MG tablet Take 10 mg by mouth at bedtime.     sertraline (ZOLOFT) 100 MG tablet Take 100 mg by mouth daily as needed (anxiety).      No current facility-administered medications for this visit.   Allergies:  Patient has no known allergies.   ROS: No orthopnea or PND.  Physical Exam: VS:  BP 120/62   Pulse 67   Ht 5\' 9"  (1.753 m)   Wt 151 lb 9.6 oz (68.8 kg)   SpO2 96%   BMI 22.39 kg/m , BMI Body mass index is 22.39 kg/m.  Wt Readings from Last 3 Encounters:  01/11/21 151 lb 9.6 oz (68.8 kg)  07/26/20 148 lb 8 oz  (67.4 kg)  06/20/20 149 lb 12.8 oz (67.9 kg)    General: Patient appears comfortable at rest. HEENT: Conjunctiva and lids normal, wearing a mask. Neck: Supple, no elevated JVP or carotid bruits, no thyromegaly. Lungs: Clear to auscultation, nonlabored breathing at rest. Cardiac: Irregularly irregular, no S3, 1/6 systolic murmur, no pericardial rub. Extremities: No pitting edema.  ECG:  An ECG dated 12/12/2020 was personally reviewed today and demonstrated:  Atrial fibrillation with right bundle branch block.  Recent Labwork: 05/22/2020: ALT 16; AST 20; BUN 14; Creatinine, Ser 0.92; Hemoglobin 13.4; Platelets 194; Potassium 3.4; Sodium 139  October 2022: Hemoglobin 13.9, platelets 196, potassium 4.3, BUN 17, creatinine 1.14, AST 18, ALT 19  Other Studies Reviewed Today:  Echocardiogram 08/06/2016 [facility]): Normal LV wall thickness with LVEF 60-65%, normal right ventricular contraction, mild left atrial enlargement, mildly sclerotic aortic valve without stenosis, mild mitral regurgitation, mild tricuspid regurgitation, no pericardial effusion.  Assessment and Plan:  1.  Permanent atrial fibrillation.  CHA2DS2-VASc score is 4. He is asymptomatic in terms of palpitations.  Continue Cardizem CD at present dose, also Eliquis.  Lab work reviewed.  2.  Essential hypertension, blood pressure is very well controlled today.  Keep follow-up with Dr. Manuella Ghazi.  Medication Adjustments/Labs and Tests Ordered: Current medicines are reviewed at length with the patient today.  Concerns regarding medicines are outlined above.   Tests Ordered: No orders of the defined types were placed in this encounter.   Medication Changes: No orders of the defined types were placed in this encounter.   Disposition:  Follow up  6 months.  Signed, Satira Sark, MD, [facility] 01/11/2021 2:49 PM    [place] at [place], [place], [place] [address] Phone: [phone]; Fax: [phone]

## 2021-01-11 NOTE — Patient Instructions (Addendum)

## 2021-01-16 ENCOUNTER — Telehealth: Payer: Self-pay | Admitting: Cardiology

## 2021-01-16 MED ORDER — DILTIAZEM HCL ER COATED BEADS 240 MG PO CP24: 240.0000 mg | ORAL_CAPSULE | Freq: Every day | ORAL | 3 refills | Status: DC

## 2021-01-16 NOTE — Telephone Encounter (Signed)
Per Dr. Myles Gip last office not on 01/11/2021:   Continue Cardizem CD at present dose, also Eliquis.  Refilled sent to [facility].

## 2021-01-16 NOTE — Telephone Encounter (Signed)
New Message:    Patient wants to know if Dr Domenic Polite wants him  to continue to take Diltiazem? If so, he will need it called in please. He would like for you to call him and let him know please.  *STAT* If patient is at the pharmacy, call can be transferred to refill team.   1. Which medications need to be refilled? (please list name of each medication and dose if known) new prescription for Diltiazem  2. Which pharmacy/location (including street and city if local pharmacy) is medication to be sent to? [facility] [place]  3. Do they need a 30 day or 90 day supply? 90 days and refills

## 2021-01-24 DIAGNOSIS — I10 Essential (primary) hypertension: Secondary | ICD-10-CM | POA: Diagnosis not present

## 2021-01-24 DIAGNOSIS — Z299 Encounter for prophylactic measures, unspecified: Secondary | ICD-10-CM | POA: Diagnosis not present

## 2021-01-24 DIAGNOSIS — J449 Chronic obstructive pulmonary disease, unspecified: Secondary | ICD-10-CM | POA: Diagnosis not present

## 2021-01-24 DIAGNOSIS — F419 Anxiety disorder, unspecified: Secondary | ICD-10-CM | POA: Diagnosis not present

## 2021-01-24 DIAGNOSIS — F32A Depression, unspecified: Secondary | ICD-10-CM | POA: Diagnosis not present

## 2021-01-31 ENCOUNTER — Encounter (INDEPENDENT_AMBULATORY_CARE_PROVIDER_SITE_OTHER): Payer: Self-pay | Admitting: Internal Medicine

## 2021-01-31 ENCOUNTER — Ambulatory Visit (INDEPENDENT_AMBULATORY_CARE_PROVIDER_SITE_OTHER): Payer: Medicare PPO | Admitting: Internal Medicine

## 2021-01-31 ENCOUNTER — Other Ambulatory Visit: Payer: Self-pay

## 2021-01-31 DIAGNOSIS — R748 Abnormal levels of other serum enzymes: Secondary | ICD-10-CM | POA: Diagnosis not present

## 2021-01-31 MED ORDER — METAMUCIL SMOOTH TEXTURE 58.6 % PO POWD: 1.0000 | Freq: Every day | ORAL | Status: DC

## 2021-01-31 NOTE — Patient Instructions (Signed)
Physician will call with result of blood work when completed

## 2021-01-31 NOTE — Progress Notes (Signed)
Presenting complaint;  Follow-up for constipation.  Database and subjective:  Patient is 80 year old Caucasian male who is here for scheduled visit.  He was last seen on 07/26/2020.  He has chronic constipation.  He has tried various medications but none worked to his satisfaction. He also has a history of colonic polyps.  Last colonoscopy was in November 2021 with removal of 5 small polyps.  These were tubular adenomas.  He had pancolonic diverticulosis and external hemorrhoids.  Patient says he is doing well.  Friend of his told him to try milk of magnesia and he has been taking it couple of times a week.  He takes cupful at a time.  He is not having any cramping or nausea.  He remains on fiber supplement.  He says his appetite is fair.  He has lost 1 pound since his last visit.  He still can go as many as 3 days without a bowel movement. Review of his weight records reveal that he has lost 18 pounds in the last 3 years. Patient lives alone.  He usually does not eat breakfast.  He walks half to 1 mile daily.  He does complain of insomnia.  He is using Zoloft once or twice a week but it does not help.    Current Medications: Outpatient Encounter Medications as of 01/31/2021  Medication Sig   allopurinol (ZYLOPRIM) 300 MG tablet Take 300 mg by mouth daily.    apixaban (ELIQUIS) 5 MG TABS tablet Take 1 tablet (5 mg total) by mouth 2 (two) times daily.   atorvastatin (LIPITOR) 20 MG tablet Take 20 mg by mouth at bedtime.    budesonide-formoterol (SYMBICORT) 80-4.5 MCG/ACT inhaler Inhale 2 puffs into the lungs 2 (two) times daily. (Patient taking differently: Inhale 2 puffs into the lungs 2 (two) times daily as needed (shortness of breath).)   citalopram (CELEXA) 20 MG tablet Take 20 mg by mouth daily.   clonazePAM (KLONOPIN) 0.5 MG tablet Take 0.5 mg by mouth 3 (three) times daily.    diltiazem (CARDIZEM CD) 240 MG 24 hr capsule Take 1 capsule (240 mg total) by mouth daily.   Elastic Bandages &  Supports (MEDICAL COMPRESSION STOCKINGS) MISC 1 each by Does not apply route as directed. 1 pair of low pressure below the knee compression stockings Dx: mild orthostatic hypotension   Hydrocortisone, Perianal, (PROCTO-PAK) 1 % CREA Apply to anal canal twice daily for 2 weeks and thereafter as needed.   magnesium hydroxide (MILK OF MAGNESIA) 400 MG/5ML suspension Take 15 mLs by mouth at bedtime as needed for mild constipation.   montelukast (SINGULAIR) 10 MG tablet Take 10 mg by mouth at bedtime.   psyllium (METAMUCIL SMOOTH TEXTURE) 58.6 % powder Take 1 packet by mouth daily.   sertraline (ZOLOFT) 100 MG tablet Take 100 mg by mouth daily as needed (anxiety).    [DISCONTINUED] lubiprostone (AMITIZA) 24 MCG capsule Take 1 capsule (24 mcg total) by mouth 2 (two) times daily with a meal. (Patient not taking: Reported on 01/31/2021)   [DISCONTINUED] Melatonin 10 MG TABS Take 10 mg by mouth at bedtime.    No facility-administered encounter medications on file as of 01/31/2021.     Objective: Blood pressure 121/65, pulse 67, temperature 98.5 F (36.9 C), temperature source Oral, height '5\' 9"'  (1.753 m), weight 150 lb 11.2 oz (68.4 kg). Patient is alert and in no acute distress. Conjunctiva is pink. Sclera is nonicteric Oropharyngeal mucosa is normal. No neck masses or thyromegaly noted. Cardiac exam with  regular rhythm normal S1 and S2. No murmur or gallop noted. Lungs are clear to auscultation. Abdomen is flat.  He has low midline scar.  He is wearing depends.  On palpation abdomen is soft and nontender with organomegaly or masses.  Above lab data reviewed. No LE edema or clubbing noted.  Labs/studies Results:   CBC Latest Ref Rng & Units 05/22/2020 09/02/2019 03/31/2019  WBC 4.0 - 10.5 K/uL 5.2 9.0 5.6  Hemoglobin 13.0 - 17.0 g/dL 13.4 13.5 14.0  Hematocrit 39.0 - 52.0 % 40.3 41.1 42.8  Platelets 150 - 400 K/uL 194 187 250    CMP Latest Ref Rng & Units 05/22/2020 09/02/2019 03/31/2019   Glucose 70 - 99 mg/dL 99 114(H) 103(H)  BUN 8 - 23 mg/dL '14 9 8  ' Creatinine 0.61 - 1.24 mg/dL 0.92 1.11 0.93  Sodium 135 - 145 mmol/L 139 137 140  Potassium 3.5 - 5.1 mmol/L 3.4(L) 3.4(L) 3.5  Chloride 98 - 111 mmol/L 104 101 103  CO2 22 - 32 mmol/L '28 29 29  ' Calcium 8.9 - 10.3 mg/dL 8.9 8.7(L) 8.3(L)  Total Protein 6.5 - 8.1 g/dL 6.1(L) 6.5 -  Total Bilirubin 0.3 - 1.2 mg/dL 1.3(H) 1.3(H) -  Alkaline Phos 38 - 126 U/L 87 121 -  AST 15 - 41 U/L 20 16 -  ALT 0 - 44 U/L 16 10 -    Hepatic Function Latest Ref Rng & Units 05/22/2020 09/02/2019 09/28/2017  Total Protein 6.5 - 8.1 g/dL 6.1(L) 6.5 6.4(L)  Albumin 3.5 - 5.0 g/dL 3.5 3.4(L) 3.6  AST 15 - 41 U/L '20 16 20  ' ALT 0 - 44 U/L '16 10 11  ' Alk Phosphatase 38 - 126 U/L 87 121 109  Total Bilirubin 0.3 - 1.2 mg/dL 1.3(H) 1.3(H) 1.2    Above lab data reviewed.  Assessment:  #1.  Chronic constipation.  He is on fiber supplement.  He is using milk of magnesia at a low dose once or twice a week.  He should do his best to have at least 3 bowel movements a week.  He should try not to go 3 days without a bowel movement as he is at risk for impaction.  He has preserved renal function.  Therefore I do not feel there is contraindication use of MOM.  #2.  History of colonic adenomas.  He had 5 small adenomas removed 1 year ago.  If he continues to do well he may consider having another colonoscopy but will play by the year.  #3.  Weight loss.  He is lost 2 pounds this year but he has lost 18 pounds in last 3 years.  Suspect weight loss is due to diminished calorie intake as he lives alone and does not know how to cook.   Plan:  Continue high-fiber diet and fiber supplement. Patient encouraged to eat 3 meals and at least 2 snacks in between. Patient advised to take milk of magnesia on days when he does not have a bowel movement. Patient would like to return for office visit in 6 months.

## 2021-02-15 ENCOUNTER — Other Ambulatory Visit (INDEPENDENT_AMBULATORY_CARE_PROVIDER_SITE_OTHER): Payer: Self-pay | Admitting: *Deleted

## 2021-02-15 DIAGNOSIS — R748 Abnormal levels of other serum enzymes: Secondary | ICD-10-CM

## 2021-02-17 ENCOUNTER — Other Ambulatory Visit: Payer: Self-pay | Admitting: *Deleted

## 2021-02-17 MED ORDER — APIXABAN 5 MG PO TABS: 5.0000 mg | ORAL_TABLET | Freq: Two times a day (BID) | ORAL | 3 refills | Status: DC

## 2021-02-20 ENCOUNTER — Telehealth (INDEPENDENT_AMBULATORY_CARE_PROVIDER_SITE_OTHER): Payer: Self-pay | Admitting: *Deleted

## 2021-02-20 DIAGNOSIS — R748 Abnormal levels of other serum enzymes: Secondary | ICD-10-CM | POA: Diagnosis not present

## 2021-02-21 LAB — HEPATIC FUNCTION PANEL
AG Ratio: 2 (calc) (ref 1.0–2.5)
ALT: 14 U/L (ref 9–46)
AST: 17 U/L (ref 10–35)
Albumin: 4.3 g/dL (ref 3.6–5.1)
Alkaline phosphatase (APISO): 123 U/L (ref 35–144)
Bilirubin, Direct: 0.2 mg/dL (ref 0.0–0.2)
Globulin: 2.2 g/dL (calc) (ref 1.9–3.7)
Indirect Bilirubin: 0.6 mg/dL (calc) (ref 0.2–1.2)
Total Bilirubin: 0.8 mg/dL (ref 0.2–1.2)
Total Protein: 6.5 g/dL (ref 6.1–8.1)

## 2021-02-21 NOTE — Telephone Encounter (Signed)
error 

## 2021-03-13 ENCOUNTER — Telehealth: Payer: Self-pay | Admitting: *Deleted

## 2021-03-13 NOTE — Telephone Encounter (Signed)
Faxed notification received from [employer] that Eliquis 5 mg approved for this calendar year but need 3% out of pocket prescription expense met first. Per [facility] must spend total of $580.39 for year 2023 on prescriptions before receiving eliquis at no cost

## 2021-03-14 MED ORDER — APIXABAN 5 MG PO TABS: 5.0000 mg | ORAL_TABLET | Freq: Two times a day (BID) | ORAL | 0 refills | Status: DC

## 2021-03-14 NOTE — Telephone Encounter (Signed)
Patient informed after walking into office. Verbalized understanding of plan. Samples provided

## 2021-05-17 DIAGNOSIS — Z87891 Personal history of nicotine dependence: Secondary | ICD-10-CM | POA: Diagnosis not present

## 2021-05-17 DIAGNOSIS — I10 Essential (primary) hypertension: Secondary | ICD-10-CM | POA: Diagnosis not present

## 2021-05-17 DIAGNOSIS — D6869 Other thrombophilia: Secondary | ICD-10-CM | POA: Diagnosis not present

## 2021-05-17 DIAGNOSIS — Z6822 Body mass index (BMI) 22.0-22.9, adult: Secondary | ICD-10-CM | POA: Diagnosis not present

## 2021-05-17 DIAGNOSIS — Z299 Encounter for prophylactic measures, unspecified: Secondary | ICD-10-CM | POA: Diagnosis not present

## 2021-05-17 DIAGNOSIS — I25118 Atherosclerotic heart disease of native coronary artery with other forms of angina pectoris: Secondary | ICD-10-CM | POA: Diagnosis not present

## 2021-05-17 DIAGNOSIS — B354 Tinea corporis: Secondary | ICD-10-CM | POA: Diagnosis not present

## 2021-05-22 ENCOUNTER — Telehealth: Payer: Self-pay | Admitting: *Deleted

## 2021-05-22 MED ORDER — APIXABAN 5 MG PO TABS: 5.0000 mg | ORAL_TABLET | Freq: Two times a day (BID) | ORAL | 0 refills | Status: DC

## 2021-05-22 NOTE — Telephone Encounter (Signed)
[facility] contacted to check prescription out of pocket expense since 03/13/2021 since patient request eliquis samples ?Since 03/13/2021 has paid $93.04 out of pocket for prescriptions per [facility] pharmacist ? ?Contacted patient and advised eliquis samples available for pick up via voicemail ?

## 2021-06-06 ENCOUNTER — Telehealth (INDEPENDENT_AMBULATORY_CARE_PROVIDER_SITE_OTHER): Payer: Self-pay | Admitting: *Deleted

## 2021-06-06 NOTE — Telephone Encounter (Signed)
Pt called and states he goes one week at a time without BM. Has some small pieces of stool that are hard that will come out. Uses one full cap of milk of mag about twice a week. Not doing metamucil anymore that is on med list. States its been a while since he done that. Does take a protein shake that has fiber. No abdominal pain, would like to see if something could  be called in for him  ?[facility] [place].  ?Last appt was 01-31-21 ?

## 2021-06-06 NOTE — Telephone Encounter (Signed)
Per dr Laural Golden - take metamucil one packet every day and milk of mag 3 days a week. Does not want him to go more than 2 days without having a stool. Keep stool diary and call back in one month with update or sooner if worse.  ?Discussed with patient and he verbalized understanding.  ?

## 2021-06-15 DIAGNOSIS — R350 Frequency of micturition: Secondary | ICD-10-CM | POA: Diagnosis not present

## 2021-06-15 DIAGNOSIS — J449 Chronic obstructive pulmonary disease, unspecified: Secondary | ICD-10-CM | POA: Diagnosis not present

## 2021-06-15 DIAGNOSIS — N400 Enlarged prostate without lower urinary tract symptoms: Secondary | ICD-10-CM | POA: Diagnosis not present

## 2021-06-15 DIAGNOSIS — I10 Essential (primary) hypertension: Secondary | ICD-10-CM | POA: Diagnosis not present

## 2021-06-15 DIAGNOSIS — Z299 Encounter for prophylactic measures, unspecified: Secondary | ICD-10-CM | POA: Diagnosis not present

## 2021-08-01 ENCOUNTER — Encounter (INDEPENDENT_AMBULATORY_CARE_PROVIDER_SITE_OTHER): Payer: Self-pay | Admitting: Internal Medicine

## 2021-08-01 ENCOUNTER — Ambulatory Visit (INDEPENDENT_AMBULATORY_CARE_PROVIDER_SITE_OTHER): Payer: Medicare PPO | Admitting: Internal Medicine

## 2021-08-01 DIAGNOSIS — M25569 Pain in unspecified knee: Secondary | ICD-10-CM | POA: Diagnosis not present

## 2021-08-01 DIAGNOSIS — Z299 Encounter for prophylactic measures, unspecified: Secondary | ICD-10-CM | POA: Diagnosis not present

## 2021-08-01 DIAGNOSIS — I4891 Unspecified atrial fibrillation: Secondary | ICD-10-CM | POA: Diagnosis not present

## 2021-08-01 DIAGNOSIS — I10 Essential (primary) hypertension: Secondary | ICD-10-CM | POA: Diagnosis not present

## 2021-08-01 DIAGNOSIS — Z6823 Body mass index (BMI) 23.0-23.9, adult: Secondary | ICD-10-CM | POA: Diagnosis not present

## 2021-08-01 DIAGNOSIS — Z0000 Encounter for general adult medical examination without abnormal findings: Secondary | ICD-10-CM | POA: Diagnosis not present

## 2021-08-02 ENCOUNTER — Other Ambulatory Visit: Payer: Self-pay | Admitting: *Deleted

## 2021-08-02 MED ORDER — APIXABAN 5 MG PO TABS: 5.0000 mg | ORAL_TABLET | Freq: Two times a day (BID) | ORAL | 0 refills | Status: DC

## 2021-08-08 DIAGNOSIS — Z9181 History of falling: Secondary | ICD-10-CM | POA: Diagnosis not present

## 2021-08-08 DIAGNOSIS — I10 Essential (primary) hypertension: Secondary | ICD-10-CM | POA: Diagnosis not present

## 2021-08-08 DIAGNOSIS — Z23 Encounter for immunization: Secondary | ICD-10-CM | POA: Diagnosis not present

## 2021-08-08 DIAGNOSIS — M25461 Effusion, right knee: Secondary | ICD-10-CM | POA: Diagnosis not present

## 2021-08-08 DIAGNOSIS — M25569 Pain in unspecified knee: Secondary | ICD-10-CM | POA: Diagnosis not present

## 2021-08-08 DIAGNOSIS — J449 Chronic obstructive pulmonary disease, unspecified: Secondary | ICD-10-CM | POA: Diagnosis not present

## 2021-08-08 DIAGNOSIS — M25561 Pain in right knee: Secondary | ICD-10-CM | POA: Diagnosis not present

## 2021-08-08 DIAGNOSIS — D692 Other nonthrombocytopenic purpura: Secondary | ICD-10-CM | POA: Diagnosis not present

## 2021-08-08 DIAGNOSIS — Z299 Encounter for prophylactic measures, unspecified: Secondary | ICD-10-CM | POA: Diagnosis not present

## 2021-08-31 ENCOUNTER — Encounter (INDEPENDENT_AMBULATORY_CARE_PROVIDER_SITE_OTHER): Payer: Self-pay | Admitting: Gastroenterology

## 2021-08-31 ENCOUNTER — Ambulatory Visit (INDEPENDENT_AMBULATORY_CARE_PROVIDER_SITE_OTHER): Payer: Medicare HMO | Admitting: Gastroenterology

## 2021-08-31 VITALS — BP 125/76 | HR 80 | Temp 99.2°F | Ht 69.0 in | Wt 156.0 lb

## 2021-08-31 DIAGNOSIS — K5900 Constipation, unspecified: Secondary | ICD-10-CM | POA: Diagnosis not present

## 2021-08-31 NOTE — Progress Notes (Signed)
Referring Provider: Monico Blitz, MD Primary Care Physician:  Monico Blitz, MD Primary GI Physician: Duane Boston   Chief Complaint  Patient presents with   Constipation    Follow up on constipation. Has BM every 2 -3 days and if bowels dont move then he will take milk of mag.    HPI:   Jackson Martin is a 81 y.o. male with past medical history of anxiety, a fib, COPD, GERD, HTN, gout, HLD, MI, prostate cancer  Patient presenting today for constipation   Last seen 01/31/21, hx of chronic constipation, tried various meds in the past without results. Using milk of magnesia a few times per week and doing daily fiber supplement, sometimes goes 3 days without a BM, recommended to aim for atleast 3 BMs per week to avoid impaction.   Today, he states that he has a BM every 2-3 days. He is drinking plenty of water. Does not eat much fruits and veggies as he eats out a lot. He is doing daily fiber supplement and takes MOM as needed if he goes more than 3 days without a BM which provides good results. He denies rectal bleeding or melena. Appetite is good, he has gained 6 pounds since last visit, at that time he was 150lbs and had lost some weight prior to that time. He is doing a protein supplement once per day. Denies abdominal pain, nausea or vomiting. Overall feels he is doing well.   Last Colonoscopy:nov 2021, 5 polyps, all tubular adenomas, pancolonic diverticulosis and external hemorrhoids Last Endoscopy:never  Recommendations:    Past Medical History:  Diagnosis Date   Anxiety    Asthmatic bronchitis    Atrial fibrillation (HCC)    Backache    Colon polyps    COPD (chronic obstructive pulmonary disease) ([facility])    Diverticulitis 12/2019   Esophageal reflux    Essential hypertension    Gout    Hemorrhoid    Hyperlipidemia    Insomnia    Ischemic heart disease    MI (myocardial infarction) ([place]) 2003   Neck pain    Plantar fasciitis    Prostate cancer [facility])     Past  Surgical History:  Procedure Laterality Date   COLONOSCOPY     COLONOSCOPY N/A 11/15/2016   Procedure: COLONOSCOPY;  Surgeon: Rogene Houston, MD;  Location: [facility];  Service: Endoscopy;  Laterality: N/A;  1200   COLONOSCOPY WITH PROPOFOL N/A 01/20/2020   Procedure: COLONOSCOPY WITH PROPOFOL;  Surgeon: Rogene Houston, MD;  Location: [facility];  Service: Endoscopy;  Laterality: N/A;  1:15, pt refused to change time   CORONARY ANGIOPLASTY WITH STENT PLACEMENT  2007   4 stents placed   POLYPECTOMY  11/15/2016   Procedure: POLYPECTOMY;  Surgeon: Rogene Houston, MD;  Location: [facility];  Service: Endoscopy;;  colon   POLYPECTOMY  01/20/2020   Procedure: POLYPECTOMY;  Surgeon: Rogene Houston, MD;  Location: [facility];  Service: Endoscopy;;   PROSTATECTOMY  2001    Current Outpatient Medications  Medication Sig Dispense Refill   allopurinol (ZYLOPRIM) 300 MG tablet Take 300 mg by mouth daily.      apixaban (ELIQUIS) 5 MG TABS tablet Take 1 tablet (5 mg total) by mouth 2 (two) times daily. 56 tablet 0   atorvastatin (LIPITOR) 20 MG tablet Take 20 mg by mouth at bedtime.      BREZTRI AEROSPHERE 160-9-4.8 MCG/ACT AERO      citalopram (CELEXA) 20  MG tablet Take 20 mg by mouth daily.     clonazePAM (KLONOPIN) 0.5 MG tablet Take 0.5 mg by mouth 3 (three) times daily.      diltiazem (CARDIZEM CD) 240 MG 24 hr capsule Take 1 capsule (240 mg total) by mouth daily. 90 capsule 3   Elastic Bandages & Supports (MEDICAL COMPRESSION STOCKINGS) MISC 1 each by Does not apply route as directed. 1 pair of low pressure below the knee compression stockings Dx: mild orthostatic hypotension 2 each 0   magnesium hydroxide (MILK OF MAGNESIA) 400 MG/5ML suspension Take 15 mLs by mouth at bedtime as needed for mild constipation.     montelukast (SINGULAIR) 10 MG tablet Take 10 mg by mouth at bedtime.     psyllium (METAMUCIL SMOOTH TEXTURE) 58.6 % powder Take 1 packet by mouth daily.      sertraline (ZOLOFT) 100 MG tablet Take 100 mg by mouth daily as needed (anxiety).     budesonide-formoterol (SYMBICORT) 80-4.5 MCG/ACT inhaler Inhale 2 puffs into the lungs 2 (two) times daily. (Patient not taking: Reported on 08/31/2021) 1 Inhaler 11   Hydrocortisone, Perianal, (PROCTO-PAK) 1 % CREA Apply to anal canal twice daily for 2 weeks and thereafter as needed. (Patient not taking: Reported on 08/31/2021) 28 g 1   No current facility-administered medications for this visit.    Allergies as of 08/31/2021   (No Known Allergies)    Family History  Problem Relation Age of Onset   Hypertension Mother    Heart disease Mother    Heart disease Maternal Aunt    Heart disease Maternal Uncle    Heart disease Maternal Grandmother    Heart disease Maternal Grandfather    Colon cancer Brother    Breast cancer Sister    Lung cancer Sister    Lung disease Sister     Social History   Socioeconomic History   Marital status: Single    Spouse name: Not on file   Number of children: Not on file   Years of education: Not on file   Highest education level: Not on file  Occupational History   Not on file  Tobacco Use   Smoking status: Former    Packs/day: 0.75    Years: 3.00    Total pack years: 2.25    Types: Cigarettes    Quit date: 03/06/1991    Years since quitting: 30.5   Smokeless tobacco: Never  Vaping Use   Vaping Use: Never used  Substance and Sexual Activity   Alcohol use: No   Drug use: No   Sexual activity: Not on file  Other Topics Concern   Not on file  Social History Narrative   Not on file   Social Determinants of Health   Financial Resource Strain: Not on file  Food Insecurity: Not on file  Transportation Needs: Not on file  Physical Activity: Not on file  Stress: Not on file  Social Connections: Not on file   Review of systems General: negative for malaise, night sweats, fever, chills, weight loss Neck: Negative for lumps, goiter, pain and significant  neck swelling Resp: Negative for cough, wheezing, dyspnea at rest CV: Negative for chest pain, leg swelling, palpitations, orthopnea GI: denies melena, hematochezia, nausea, vomiting, diarrhea, constipation, dysphagia, odyonophagia, early satiety or unintentional weight loss.  MSK: Negative for joint pain or swelling, back pain, and muscle pain. Derm: Negative for itching or rash Psych: Denies depression, anxiety, memory loss, confusion. No homicidal or suicidal ideation.  Heme:  Negative for prolonged bleeding, bruising easily, and swollen nodes. Endocrine: Negative for cold or heat intolerance, polyuria, polydipsia and goiter. Neuro: negative for tremor, gait imbalance, syncope and seizures. The remainder of the review of systems is noncontributory.  Physical Exam: BP 125/76 (BP Location: Left Arm, Patient Position: Sitting, Cuff Size: Normal)   Pulse 80   Temp 99.2 F (37.3 C) (Oral)   Ht '5\' 9"'$  (1.753 m)   Wt 156 lb (70.8 kg)   BMI 23.04 kg/m  General:   Alert and oriented. No distress noted. Pleasant and cooperative.  Head:  Normocephalic and atraumatic. Eyes:  Conjuctiva clear without scleral icterus. Mouth:  Oral mucosa pink and moist. Good dentition. No lesions. Heart: Normal rate and rhythm, s1 and s2 heart sounds present.  Lungs: Clear lung sounds in all lobes. Respirations equal and unlabored. Abdomen:  +BS, soft, non-tender and non-distended. No rebound or guarding. No HSM or masses noted. Derm: No palmar erythema or jaundice Msk:  Symmetrical without gross deformities. Normal posture. Extremities:  Without edema. Neurologic:  Alert and  oriented x4 Psych:  Alert and cooperative. Normal mood and affect.  Invalid input(s): "6 MONTHS"   ASSESSMENT: KYLIL SWOPES is a 81 y.o. male presenting today for follow up of constipation.   Constipation well managed on daily fiber supplement with soft BM every 2-3 days, taking MOM PRN if 3 or more days without BM. Current  regimen is working well for him.   Weight is up 6 pounds since last visit. Appetite is good and he is doing protein shakes daily to help as well.   No red flag symptoms. Patient denies melena, hematochezia, nausea, vomiting, diarrhea, constipation, dysphagia, odyonophagia, early satiety or weight loss.   PLAN:  Continue with daily fiber 2. Can use MOM PRN 3. Continue with ample water intake 4. Increase fruits and veggies 5. Can continue daily protein shake  All questions were answered, patient verbalized understanding and is in agreement with plan as outlined above.    Follow Up: 1 year  Melvyn Hommes L. Alver Sorrow, MSN, APRN, AGNP-C Adult-Gerontology Nurse Practitioner [facility]

## 2021-08-31 NOTE — Patient Instructions (Signed)
It was nice to meet you! As you are doing so well currently, let's continue with current regimen of daily fiber supplement and milk of magnesia as needed, please try not to go more than 3 days without a BM.  Follow up 1 year

## 2021-09-28 DIAGNOSIS — M7061 Trochanteric bursitis, right hip: Secondary | ICD-10-CM | POA: Diagnosis not present

## 2021-09-28 DIAGNOSIS — Z299 Encounter for prophylactic measures, unspecified: Secondary | ICD-10-CM | POA: Diagnosis not present

## 2021-09-29 DIAGNOSIS — I10 Essential (primary) hypertension: Secondary | ICD-10-CM | POA: Diagnosis not present

## 2021-09-29 DIAGNOSIS — I4891 Unspecified atrial fibrillation: Secondary | ICD-10-CM | POA: Diagnosis not present

## 2021-09-29 DIAGNOSIS — Z299 Encounter for prophylactic measures, unspecified: Secondary | ICD-10-CM | POA: Diagnosis not present

## 2021-09-29 DIAGNOSIS — M25551 Pain in right hip: Secondary | ICD-10-CM | POA: Diagnosis not present

## 2021-09-30 ENCOUNTER — Encounter (HOSPITAL_COMMUNITY): Payer: Self-pay

## 2021-09-30 ENCOUNTER — Other Ambulatory Visit: Payer: Self-pay

## 2021-09-30 ENCOUNTER — Emergency Department (HOSPITAL_COMMUNITY): Payer: Medicare HMO

## 2021-09-30 ENCOUNTER — Inpatient Hospital Stay (HOSPITAL_COMMUNITY)
Admission: EM | Admit: 2021-09-30 | Discharge: 2021-10-09 | DRG: 522 | Disposition: A | Discharge status: Skilled Nursing Facility | Payer: Medicare HMO | Attending: Family Medicine | Admitting: Family Medicine

## 2021-09-30 DIAGNOSIS — S72011A Unspecified intracapsular fracture of right femur, initial encounter for closed fracture: Secondary | ICD-10-CM | POA: Diagnosis not present

## 2021-09-30 DIAGNOSIS — Z8249 Family history of ischemic heart disease and other diseases of the circulatory system: Secondary | ICD-10-CM

## 2021-09-30 DIAGNOSIS — Z9079 Acquired absence of other genital organ(s): Secondary | ICD-10-CM | POA: Diagnosis not present

## 2021-09-30 DIAGNOSIS — T07XXXA Unspecified multiple injuries, initial encounter: Secondary | ICD-10-CM | POA: Diagnosis not present

## 2021-09-30 DIAGNOSIS — I350 Nonrheumatic aortic (valve) stenosis: Secondary | ICD-10-CM | POA: Diagnosis not present

## 2021-09-30 DIAGNOSIS — M25572 Pain in left ankle and joints of left foot: Secondary | ICD-10-CM | POA: Diagnosis not present

## 2021-09-30 DIAGNOSIS — I480 Paroxysmal atrial fibrillation: Secondary | ICD-10-CM | POA: Diagnosis present

## 2021-09-30 DIAGNOSIS — D62 Acute posthemorrhagic anemia: Secondary | ICD-10-CM | POA: Diagnosis not present

## 2021-09-30 DIAGNOSIS — I4891 Unspecified atrial fibrillation: Secondary | ICD-10-CM | POA: Diagnosis not present

## 2021-09-30 DIAGNOSIS — R0600 Dyspnea, unspecified: Secondary | ICD-10-CM | POA: Diagnosis not present

## 2021-09-30 DIAGNOSIS — Z79899 Other long term (current) drug therapy: Secondary | ICD-10-CM | POA: Diagnosis not present

## 2021-09-30 DIAGNOSIS — K219 Gastro-esophageal reflux disease without esophagitis: Secondary | ICD-10-CM | POA: Diagnosis present

## 2021-09-30 DIAGNOSIS — I2510 Atherosclerotic heart disease of native coronary artery without angina pectoris: Secondary | ICD-10-CM | POA: Diagnosis present

## 2021-09-30 DIAGNOSIS — M25551 Pain in right hip: Secondary | ICD-10-CM | POA: Diagnosis not present

## 2021-09-30 DIAGNOSIS — F32A Depression, unspecified: Secondary | ICD-10-CM | POA: Diagnosis present

## 2021-09-30 DIAGNOSIS — Y92009 Unspecified place in unspecified non-institutional (private) residence as the place of occurrence of the external cause: Secondary | ICD-10-CM | POA: Diagnosis not present

## 2021-09-30 DIAGNOSIS — W1830XA Fall on same level, unspecified, initial encounter: Secondary | ICD-10-CM | POA: Diagnosis present

## 2021-09-30 DIAGNOSIS — Z8546 Personal history of malignant neoplasm of prostate: Secondary | ICD-10-CM | POA: Diagnosis not present

## 2021-09-30 DIAGNOSIS — L89899 Pressure ulcer of other site, unspecified stage: Secondary | ICD-10-CM | POA: Diagnosis present

## 2021-09-30 DIAGNOSIS — I248 Other forms of acute ischemic heart disease: Secondary | ICD-10-CM | POA: Diagnosis not present

## 2021-09-30 DIAGNOSIS — S72001A Fracture of unspecified part of neck of right femur, initial encounter for closed fracture: Secondary | ICD-10-CM | POA: Diagnosis not present

## 2021-09-30 DIAGNOSIS — R9431 Abnormal electrocardiogram [ECG] [EKG]: Secondary | ICD-10-CM | POA: Diagnosis not present

## 2021-09-30 DIAGNOSIS — R42 Dizziness and giddiness: Secondary | ICD-10-CM | POA: Diagnosis present

## 2021-09-30 DIAGNOSIS — I4811 Longstanding persistent atrial fibrillation: Secondary | ICD-10-CM | POA: Diagnosis not present

## 2021-09-30 DIAGNOSIS — M109 Gout, unspecified: Secondary | ICD-10-CM | POA: Diagnosis present

## 2021-09-30 DIAGNOSIS — Z7951 Long term (current) use of inhaled steroids: Secondary | ICD-10-CM | POA: Diagnosis not present

## 2021-09-30 DIAGNOSIS — D649 Anemia, unspecified: Secondary | ICD-10-CM | POA: Diagnosis not present

## 2021-09-30 DIAGNOSIS — I219 Acute myocardial infarction, unspecified: Secondary | ICD-10-CM | POA: Diagnosis not present

## 2021-09-30 DIAGNOSIS — J449 Chronic obstructive pulmonary disease, unspecified: Secondary | ICD-10-CM | POA: Diagnosis not present

## 2021-09-30 DIAGNOSIS — Z955 Presence of coronary angioplasty implant and graft: Secondary | ICD-10-CM

## 2021-09-30 DIAGNOSIS — I252 Old myocardial infarction: Secondary | ICD-10-CM | POA: Diagnosis not present

## 2021-09-30 DIAGNOSIS — Z7901 Long term (current) use of anticoagulants: Secondary | ICD-10-CM

## 2021-09-30 DIAGNOSIS — F419 Anxiety disorder, unspecified: Secondary | ICD-10-CM

## 2021-09-30 DIAGNOSIS — R0609 Other forms of dyspnea: Secondary | ICD-10-CM | POA: Diagnosis not present

## 2021-09-30 DIAGNOSIS — I4821 Permanent atrial fibrillation: Secondary | ICD-10-CM | POA: Diagnosis not present

## 2021-09-30 DIAGNOSIS — Z87891 Personal history of nicotine dependence: Secondary | ICD-10-CM | POA: Diagnosis not present

## 2021-09-30 DIAGNOSIS — I10 Essential (primary) hypertension: Secondary | ICD-10-CM | POA: Diagnosis not present

## 2021-09-30 DIAGNOSIS — G4700 Insomnia, unspecified: Secondary | ICD-10-CM | POA: Diagnosis present

## 2021-09-30 DIAGNOSIS — Z043 Encounter for examination and observation following other accident: Secondary | ICD-10-CM | POA: Diagnosis not present

## 2021-09-30 DIAGNOSIS — E785 Hyperlipidemia, unspecified: Secondary | ICD-10-CM

## 2021-09-30 DIAGNOSIS — M1612 Unilateral primary osteoarthritis, left hip: Secondary | ICD-10-CM | POA: Diagnosis not present

## 2021-09-30 DIAGNOSIS — J984 Other disorders of lung: Secondary | ICD-10-CM | POA: Diagnosis not present

## 2021-09-30 DIAGNOSIS — Z471 Aftercare following joint replacement surgery: Secondary | ICD-10-CM | POA: Diagnosis not present

## 2021-09-30 DIAGNOSIS — R531 Weakness: Secondary | ICD-10-CM | POA: Diagnosis not present

## 2021-09-30 DIAGNOSIS — Z01810 Encounter for preprocedural cardiovascular examination: Secondary | ICD-10-CM | POA: Diagnosis not present

## 2021-09-30 DIAGNOSIS — Z7401 Bed confinement status: Secondary | ICD-10-CM | POA: Diagnosis not present

## 2021-09-30 DIAGNOSIS — S72031D Displaced midcervical fracture of right femur, subsequent encounter for closed fracture with routine healing: Secondary | ICD-10-CM | POA: Diagnosis not present

## 2021-09-30 DIAGNOSIS — R000 Tachycardia, unspecified: Secondary | ICD-10-CM | POA: Diagnosis not present

## 2021-09-30 DIAGNOSIS — Z96641 Presence of right artificial hip joint: Secondary | ICD-10-CM | POA: Diagnosis not present

## 2021-09-30 LAB — TYPE AND SCREEN
ABO/RH(D): O NEG
Antibody Screen: NEGATIVE

## 2021-09-30 LAB — BASIC METABOLIC PANEL
Anion gap: 8 (ref 5–15)
BUN: 26 mg/dL — ABNORMAL HIGH (ref 8–23)
CO2: 26 mmol/L (ref 22–32)
Calcium: 9.4 mg/dL (ref 8.9–10.3)
Chloride: 106 mmol/L (ref 98–111)
Creatinine, Ser: 1.17 mg/dL (ref 0.61–1.24)
GFR, Estimated: >60 mL/min (ref >60)
Glucose, Bld: 109 mg/dL — ABNORMAL HIGH (ref 70–99)
Potassium: 3.9 mmol/L (ref 3.5–5.1)
Sodium: 140 mmol/L (ref 135–145)

## 2021-09-30 LAB — CBC WITH DIFFERENTIAL/PLATELET
Abs Immature Granulocytes: 0.02 10*3/uL (ref 0.00–0.07)
Basophils Absolute: 0 10*3/uL (ref 0.0–0.1)
Basophils Relative: 0 %
Eosinophils Absolute: 0 10*3/uL (ref 0.0–0.5)
Eosinophils Relative: 0 %
HCT: 42.1 % (ref 39.0–52.0)
Hemoglobin: 14 g/dL (ref 13.0–17.0)
Immature Granulocytes: 0 %
Lymphocytes Relative: 4 %
Lymphs Abs: 0.4 10*3/uL — ABNORMAL LOW (ref 0.7–4.0)
MCH: 31.1 pg (ref 26.0–34.0)
MCHC: 33.3 g/dL (ref 30.0–36.0)
MCV: 93.6 fL (ref 80.0–100.0)
Monocytes Absolute: 0.8 10*3/uL (ref 0.1–1.0)
Monocytes Relative: 7 %
Neutro Abs: 9.6 10*3/uL — ABNORMAL HIGH (ref 1.7–7.7)
Neutrophils Relative %: 89 %
Platelets: 219 10*3/uL (ref 150–400)
RBC: 4.5 MIL/uL (ref 4.22–5.81)
RDW: 13 % (ref 11.5–15.5)
WBC: 10.8 10*3/uL — ABNORMAL HIGH (ref 4.0–10.5)
nRBC: 0 % (ref 0.0–0.2)

## 2021-09-30 LAB — CK: Total CK: 1581 U/L — ABNORMAL HIGH (ref 49–397)

## 2021-09-30 LAB — PROTIME-INR
INR: 1.2 (ref 0.8–1.2)
Prothrombin Time: 15.5 seconds — ABNORMAL HIGH (ref 11.4–15.2)

## 2021-09-30 MED ORDER — CLONAZEPAM 0.5 MG PO TABS
0.5000 mg | ORAL_TABLET | Freq: Three times a day (TID) | ORAL | Status: DC
  Administered 2021-09-30 – 2021-10-01 (×3): 0.5 mg via ORAL
  Filled 2021-09-30 (×3): qty 1

## 2021-09-30 MED ORDER — ONDANSETRON HCL 4 MG PO TABS: 4.0000 mg | ORAL_TABLET | Freq: Four times a day (QID) | ORAL | Status: DC | PRN

## 2021-09-30 MED ORDER — HYDROMORPHONE HCL 1 MG/ML IJ SOLN
1.0000 mg | INTRAMUSCULAR | Status: DC | PRN
  Administered 2021-09-30 – 2021-10-01 (×3): 1 mg via INTRAVENOUS
  Filled 2021-09-30 (×3): qty 1

## 2021-09-30 MED ORDER — DILTIAZEM HCL 25 MG/5ML IV SOLN
20.0000 mg | Freq: Once | INTRAVENOUS | Status: AC
  Administered 2021-09-30: 20 mg via INTRAVENOUS
  Filled 2021-09-30: qty 5

## 2021-09-30 MED ORDER — DOCUSATE SODIUM 100 MG PO CAPS
100.0000 mg | ORAL_CAPSULE | Freq: Two times a day (BID) | ORAL | Status: DC
  Administered 2021-09-30 – 2021-10-01 (×2): 100 mg via ORAL
  Filled 2021-09-30 (×2): qty 1

## 2021-09-30 MED ORDER — FENTANYL CITRATE PF 50 MCG/ML IJ SOSY
50.0000 ug | PREFILLED_SYRINGE | INTRAMUSCULAR | Status: AC | PRN
  Administered 2021-09-30 (×2): 50 ug via INTRAVENOUS
  Filled 2021-09-30 (×2): qty 1

## 2021-09-30 MED ORDER — SERTRALINE HCL 100 MG PO TABS
100.0000 mg | ORAL_TABLET | Freq: Every day | ORAL | Status: DC
  Administered 2021-10-01 – 2021-10-09 (×8): 100 mg via ORAL
  Filled 2021-09-30: qty 1
  Filled 2021-09-30: qty 2
  Filled 2021-09-30 (×3): qty 1
  Filled 2021-09-30: qty 2
  Filled 2021-09-30 (×3): qty 1

## 2021-09-30 MED ORDER — FLUTICASONE FUROATE-VILANTEROL 100-25 MCG/ACT IN AEPB
1.0000 | INHALATION_SPRAY | Freq: Every day | RESPIRATORY_TRACT | Status: DC
  Administered 2021-09-30 – 2021-10-09 (×6): 1 via RESPIRATORY_TRACT
  Filled 2021-09-30 (×3): qty 28

## 2021-09-30 MED ORDER — POLYETHYLENE GLYCOL 3350 17 G PO PACK: 17.0000 g | PACK | Freq: Every day | ORAL | Status: DC | PRN

## 2021-09-30 MED ORDER — DILTIAZEM HCL ER COATED BEADS 120 MG PO CP24
240.0000 mg | ORAL_CAPSULE | Freq: Every day | ORAL | Status: DC
  Administered 2021-09-30 – 2021-10-09 (×10): 240 mg via ORAL
  Filled 2021-09-30: qty 1
  Filled 2021-09-30 (×6): qty 2
  Filled 2021-09-30: qty 1
  Filled 2021-09-30: qty 2
  Filled 2021-09-30: qty 1

## 2021-09-30 MED ORDER — ONDANSETRON HCL 4 MG/2ML IJ SOLN: 4.0000 mg | Freq: Four times a day (QID) | INTRAMUSCULAR | Status: DC | PRN

## 2021-09-30 MED ORDER — MONTELUKAST SODIUM 10 MG PO TABS
10.0000 mg | ORAL_TABLET | Freq: Every day | ORAL | Status: DC
  Administered 2021-09-30 – 2021-10-09 (×10): 10 mg via ORAL
  Filled 2021-09-30 (×10): qty 1

## 2021-09-30 MED ORDER — ONDANSETRON HCL 4 MG/2ML IJ SOLN
4.0000 mg | Freq: Once | INTRAMUSCULAR | Status: AC
  Administered 2021-09-30: 4 mg via INTRAVENOUS
  Filled 2021-09-30: qty 2

## 2021-09-30 NOTE — ED Triage Notes (Signed)
Pt arrived via [facility] due to fall that occurred last night while walking into the bathroom. Says he has been on the floor since last night. Complains of right hip pain.

## 2021-09-30 NOTE — H&P (Signed)
History and Physical    Patient: Jackson Martin ZOX:[id number] DOB: 08-Apr-1940 DOA: 09/30/2021 DOS: the patient was seen and examined on 09/30/2021 PCP: Jackson Blitz, MD  Patient coming from: Home  Chief Complaint:  Chief Complaint  Patient presents with   Fall   HPI: [name] is a 81 y.o. male with medical history significant of coronary artery disease and history of MI, atrial fibrillation rate controlled on Eliquis, hypertension, hyperlipidemia, anxiety, history of prostate cancer.  The patient also has a history of anxiety controlled on sertraline and Klonopin.  The patient presents to the hospital after a fall last night.  He was unable to get up off the ground.  Patient struck his right hip on the ground and was unable to get up off the ground.  He was found by family members approximately 12 hours later.  His family members lives below him and occasionally help.  Patient denies headache, back pain, or any other pain.  He was brought to the hospital for further evaluation.  Patient's pain is worse with movement and improved with laying still.  The patient is on Cardizem and Eliquis.  The last time the patient took Eliquis was Thursday.  He did not take the Eliquis yesterday (Friday) or today.  Review of Systems: As mentioned in the history of present illness. All other systems reviewed and are negative. Past Medical History:  Diagnosis Date   Anxiety    Asthmatic bronchitis    Atrial fibrillation (HCC)    Backache    Colon polyps    COPD (chronic obstructive pulmonary disease) ([place])    Diverticulitis 12/2019   Esophageal reflux    Essential hypertension    Gout    Hemorrhoid    Hyperlipidemia    Insomnia    Ischemic heart disease    MI (myocardial infarction) ([place]) 2003   Neck pain    Plantar fasciitis    Prostate cancer [facility])    Past Surgical History:  Procedure Laterality Date   COLONOSCOPY     COLONOSCOPY N/A 11/15/2016   Procedure: COLONOSCOPY;  Surgeon:  Jackson Houston, MD;  Location: [facility];  Service: Endoscopy;  Laterality: N/A;  1200   COLONOSCOPY WITH PROPOFOL N/A 01/20/2020   Procedure: COLONOSCOPY WITH PROPOFOL;  Surgeon: Jackson Houston, MD;  Location: [facility];  Service: Endoscopy;  Laterality: N/A;  1:15, pt refused to change time   CORONARY ANGIOPLASTY WITH STENT PLACEMENT  2007   4 stents placed   POLYPECTOMY  11/15/2016   Procedure: POLYPECTOMY;  Surgeon: Jackson Houston, MD;  Location: [facility];  Service: Endoscopy;;  colon   POLYPECTOMY  01/20/2020   Procedure: POLYPECTOMY;  Surgeon: Jackson Houston, MD;  Location: [facility];  Service: Endoscopy;;   PROSTATECTOMY  2001   Social History:  reports that he quit smoking about 30 years ago. His smoking use included cigarettes. He has a 2.25 pack-year smoking history. He has never used smokeless tobacco. He reports that he does not drink alcohol and does not use drugs.  No Known Allergies  Family History  Problem Relation Age of Onset   Hypertension Mother    Heart disease Mother    Heart disease Maternal Aunt    Heart disease Maternal Uncle    Heart disease Maternal Grandmother    Heart disease Maternal Grandfather    Colon cancer Brother    Breast cancer Sister    Lung cancer Sister    Lung  disease Sister     Prior to Admission medications   Medication Sig Start Date End Date Taking? Authorizing Provider  allopurinol (ZYLOPRIM) 300 MG tablet Take 300 mg by mouth daily.     [provider]  apixaban (ELIQUIS) 5 MG TABS tablet Take 1 tablet (5 mg total) by mouth 2 (two) times daily. 08/02/21   Jackson Sark, MD  atorvastatin (LIPITOR) 20 MG tablet Take 20 mg by mouth at bedtime.     [provider]  BREZTRI AEROSPHERE 160-9-4.8 MCG/ACT AERO  06/17/21   [provider]  budesonide-formoterol (SYMBICORT) 80-4.5 MCG/ACT inhaler Inhale 2 puffs into the lungs 2 (two) times daily. Patient not taking: Reported on 08/31/2021  09/02/19   Jackson Martin., MD  citalopram (CELEXA) 20 MG tablet Take 20 mg by mouth daily.    [provider]  clonazePAM (KLONOPIN) 0.5 MG tablet Take 0.5 mg by mouth 3 (three) times daily.  10/19/16   [provider]  diltiazem (CARDIZEM CD) 240 MG 24 hr capsule Take 1 capsule (240 mg total) by mouth daily. 01/16/21   Jackson Sark, MD  Elastic Bandages & Supports ([place]) [place] 1 each by Does not apply route as directed. 1 pair of low pressure below the knee compression stockings Dx: mild orthostatic hypotension 04/21/19   Jackson Sark, MD  Hydrocortisone, Perianal, (PROCTO-PAK) 1 % CREA Apply to anal canal twice daily for 2 weeks and thereafter as needed. Patient not taking: Reported on 08/31/2021 05/31/20   Jackson Houston, MD  magnesium hydroxide (MILK OF MAGNESIA) 400 MG/5ML suspension Take 15 mLs by mouth at bedtime as needed for mild constipation.    [provider]  montelukast (SINGULAIR) 10 MG tablet Take 10 mg by mouth at bedtime.    [provider]  psyllium (METAMUCIL SMOOTH TEXTURE) 58.6 % powder Take 1 packet by mouth daily. 01/31/21   Jackson Houston, MD  sertraline (ZOLOFT) 100 MG tablet Take 100 mg by mouth daily as needed (anxiety). 12/04/19   [provider]    Physical Exam: Vitals:   09/30/21 1424 09/30/21 1424 09/30/21 1502 09/30/21 1525  BP:   134/88 128/73  Pulse:    (!) 101  Resp:   14 15  Temp:      SpO2: (!) 88% 99% 99% 100%  Weight:      Height:       General: Elderly male. Awake and alert and oriented x3. No acute cardiopulmonary distress.  HEENT: Normocephalic atraumatic.  Right and left ears normal in appearance.  Pupils equal, round, reactive to light. Extraocular muscles are intact. Sclerae anicteric and noninjected.  Moist mucosal membranes. No mucosal lesions.  Neck: Neck supple without lymphadenopathy. No carotid bruits. No masses palpated.  Cardiovascular: Irregularly  irregular rate. No murmurs, rubs, gallops auscultated. No JVD.  Respiratory: Good respiratory effort with no wheezes, rales, rhonchi. Lungs clear to auscultation bilaterally.  No accessory muscle use. Abdomen: Soft, nontender, nondistended. Active bowel sounds. No masses or hepatosplenomegaly  Skin: No rashes, lesions, or ulcerations.  Dry, warm to touch. 2+ dorsalis pedis and radial pulses. Musculoskeletal: Right leg shortened and externally rotated.  Unable to move right lower extremity. Neurovascularly intact distally. Otherwise, good ROM.  No contractures  Psychiatric: Intact judgment and insight. Pleasant and cooperative. Neurologic: No focal neurological deficits. Strength is 5/5 and symmetric in upper and lower extremities.  Cranial nerves II through XII are grossly intact.   Data Reviewed: Results for  orders placed or performed during the hospital encounter of 09/30/21 (from the past 24 hour(s))  Basic metabolic panel     Status: Abnormal   Collection Time: 09/30/21  1:27 PM  Result Value Ref Range   Sodium 140 135 - 145 mmol/L   Potassium 3.9 3.5 - 5.1 mmol/L   Chloride 106 98 - 111 mmol/L   CO2 26 22 - 32 mmol/L   Glucose, Bld 109 (H) 70 - 99 mg/dL   BUN 26 (H) 8 - 23 mg/dL   Creatinine, Ser 1.17 0.61 - 1.24 mg/dL   Calcium 9.4 8.9 - 10.3 mg/dL   GFR, Estimated >60 >60 mL/min   Anion gap 8 5 - 15  CBC with Differential     Status: Abnormal   Collection Time: 09/30/21  1:27 PM  Result Value Ref Range   WBC 10.8 (H) 4.0 - 10.5 K/uL   RBC 4.50 4.22 - 5.81 MIL/uL   Hemoglobin 14.0 13.0 - 17.0 g/dL   HCT 42.1 39.0 - 52.0 %   MCV 93.6 80.0 - 100.0 fL   MCH 31.1 26.0 - 34.0 pg   MCHC 33.3 30.0 - 36.0 g/dL   RDW 13.0 11.5 - 15.5 %   Platelets 219 150 - 400 K/uL   nRBC 0.0 0.0 - 0.2 %   Neutrophils Relative % 89 %   Neutro Abs 9.6 (H) 1.7 - 7.7 K/uL   Lymphocytes Relative 4 %   Lymphs Abs 0.4 (L) 0.7 - 4.0 K/uL   Monocytes Relative 7 %   Monocytes Absolute 0.8 0.1 - 1.0  K/uL   Eosinophils Relative 0 %   Eosinophils Absolute 0.0 0.0 - 0.5 K/uL   Basophils Relative 0 %   Basophils Absolute 0.0 0.0 - 0.1 K/uL   Immature Granulocytes 0 %   Abs Immature Granulocytes 0.02 0.00 - 0.07 K/uL  Protime-INR     Status: Abnormal   Collection Time: 09/30/21  1:27 PM  Result Value Ref Range   Prothrombin Time 15.5 (H) 11.4 - 15.2 seconds   INR 1.2 0.8 - 1.2  Type and screen [facility]     Status: None   Collection Time: 09/30/21  1:27 PM  Result Value Ref Range   ABO/RH(D) O NEG    Antibody Screen NEG    Sample Expiration      10/03/2021,2359 Performed at [facility], [address], [place], [place] [address]    DG Hip Unilat With Pelvis 2-3 Views Right  Addendum Date: 09/30/2021   ADDENDUM REPORT: 09/30/2021 16:00 ADDENDUM: The patient was sent for an additional lateral view of the right hip. Poor penetration markedly limits evaluation of the additional lateral view. The femoral head is not well visualized on the additional lateral view. No additional fractures are noted. Electronically Signed   By: Dorise Bullion III M.D.   On: 09/30/2021 16:00   Result Date: 09/30/2021 CLINICAL DATA:  Hip fracture. EXAM: DG HIP (WITH OR WITHOUT PELVIS) 2-3V RIGHT COMPARISON:  None Available. FINDINGS: There is a subcapital right hip fracture. No dislocation of the femoral head. No pelvic bone fractures identified. Limited views of the left hip are normal. IMPRESSION: Subcapital right hip fracture.  No dislocation. Electronically Signed: By: Dorise Bullion III M.D. On: 09/30/2021 13:51   DG Chest 1 View  Result Date: 09/30/2021 CLINICAL DATA:  Fall. Hip fracture. EXAM: CHEST  1 VIEW COMPARISON:  One view chest x-ray 09/02/2019 FINDINGS: Heart size is normal. Atherosclerotic calcifications are present  at the aortic arch. No edema or effusion is present. Scarring is noted at the left greater than right lung apex. Degenerative changes are present in both shoulders.  IMPRESSION: 1. No acute cardiopulmonary disease. 2. Atherosclerosis. Electronically Signed   By: San Morelle M.D.   On: 09/30/2021 13:51     Assessment and Plan: No notes have been filed under this hospital service. Service: Hospitalist  Principal Problem:   Closed right hip fracture ([place]) Active Problems:   COPD  GOLD 0/ ? AB   Anxiety   Atrial fibrillation (HCC)   Essential hypertension   Hyperlipidemia   History of MI (myocardial infarction)  Closed hip fracture Admit Hold Eliquis Ortho to see Plan for surgery after cardiology consult on Monday N.p.o. after midnight on Sunday Atrial fibrillation Rate control with Cardizem Check INR in the morning Patient currently without shortness of breath or chest pain Hypertension Continue antihypertensives COPD No shortness of breath Continue long-acting controller inhalers Anxiety Continue SSRI History of MI    Advance Care Planning:   Code Status: Prior full code confirmed by patient  Consults: Ortho  Family Communication: None  Severity of Illness: The appropriate patient status for this patient is INPATIENT. Inpatient status is judged to be reasonable and necessary in order to provide the required intensity of service to ensure the patient's safety. The patient's presenting symptoms, physical exam findings, and initial radiographic and laboratory data in the context of their chronic comorbidities is felt to place them at high risk for further clinical deterioration. Furthermore, it is not anticipated that the patient will be medically stable for discharge from the hospital within 2 midnights of admission.   * I certify that at the point of admission it is my clinical judgment that the patient will require inpatient hospital care spanning beyond 2 midnights from the point of admission due to high intensity of service, high risk for further deterioration and high frequency of surveillance required.*  Author: Truett Mainland, DO 09/30/2021 4:01 PM  For on call review [email or web address].

## 2021-09-30 NOTE — ED Notes (Signed)
Patient transported to X-ray 

## 2021-09-30 NOTE — ED Provider Notes (Signed)
[facility] EMERGENCY DEPARTMENT Provider Note   CSN: [id number] Arrival date & time: 09/30/21  1312     History  Chief Complaint  Patient presents with   Lytle Michaels    Jackson Martin is a 81 y.o. male.   Fall  This patient is an 81 year old male with some degree of leg pain and hip pain on the right over time, he has been seen by orthopedics and referred to physical therapy.  He has been seen by his family doctor, no one can tell him why he hurts.  The patient is on Eliquis chronically, he also takes medication such as Celexa, Klonopin, Cardizem, atorvastatin as well as a history of some depression and anxiety on sertraline.  He presents to the hospital after having a fall last night.  He was not able to get up off of the ground.  He thinks that he passed out when he fell backwards while going to the bathroom.  He was complaining of right-sided hip pain which limited his ability to get up off the ground and was found over 12 hours later by his family member who lives below him.  The patient was not able to get to a phone to call for help.  He denies a headache or back pain     Home Medications Prior to Admission medications   Medication Sig Start Date End Date Taking? Authorizing Provider  allopurinol (ZYLOPRIM) 300 MG tablet Take 300 mg by mouth daily.     [provider]  apixaban (ELIQUIS) 5 MG TABS tablet Take 1 tablet (5 mg total) by mouth 2 (two) times daily. 08/02/21   Satira Sark, MD  atorvastatin (LIPITOR) 20 MG tablet Take 20 mg by mouth at bedtime.     [provider]  BREZTRI AEROSPHERE 160-9-4.8 MCG/ACT AERO  06/17/21   [provider]  budesonide-formoterol (SYMBICORT) 80-4.5 MCG/ACT inhaler Inhale 2 puffs into the lungs 2 (two) times daily. Patient not taking: Reported on 08/31/2021 09/02/19   Elodia Florence., MD  citalopram (CELEXA) 20 MG tablet Take 20 mg by mouth daily.    [provider]  clonazePAM (KLONOPIN) 0.5 MG  tablet Take 0.5 mg by mouth 3 (three) times daily.  10/19/16   [provider]  diltiazem (CARDIZEM CD) 240 MG 24 hr capsule Take 1 capsule (240 mg total) by mouth daily. 01/16/21   Satira Sark, MD  Elastic Bandages & Supports ([place]) [place] 1 each by Does not apply route as directed. 1 pair of low pressure below the knee compression stockings Dx: mild orthostatic hypotension 04/21/19   Satira Sark, MD  Hydrocortisone, Perianal, (PROCTO-PAK) 1 % CREA Apply to anal canal twice daily for 2 weeks and thereafter as needed. Patient not taking: Reported on 08/31/2021 05/31/20   Rogene Houston, MD  magnesium hydroxide (MILK OF MAGNESIA) 400 MG/5ML suspension Take 15 mLs by mouth at bedtime as needed for mild constipation.    [provider]  montelukast (SINGULAIR) 10 MG tablet Take 10 mg by mouth at bedtime.    [provider]  psyllium (METAMUCIL SMOOTH TEXTURE) 58.6 % powder Take 1 packet by mouth daily. 01/31/21   Rogene Houston, MD  sertraline (ZOLOFT) 100 MG tablet Take 100 mg by mouth daily as needed (anxiety). 12/04/19   [provider]      Allergies    Patient has no known allergies.    Review of Systems   Review of Systems  All other systems reviewed and are negative.   Physical Exam Updated Vital Signs BP 134/88   Pulse 84   Temp 97.8 F (36.6 C)   Resp 14   Ht 1.778 m ('5\' 10"'$ )   Wt 68 kg   SpO2 99%   BMI 21.52 kg/m  Physical Exam Vitals and nursing note reviewed.  Constitutional:      General: He is not in acute distress.    Appearance: He is well-developed.  HENT:     Head: Normocephalic and atraumatic.     Mouth/Throat:     Pharynx: No oropharyngeal exudate.  Eyes:     General: No scleral icterus.       Right eye: No discharge.        Left eye: No discharge.     Conjunctiva/sclera: Conjunctivae normal.     Pupils: Pupils are equal, round, and reactive to light.  Neck:     Thyroid: No  thyromegaly.     Vascular: No JVD.  Cardiovascular:     Rate and Rhythm: Normal rate and regular rhythm.     Heart sounds: Normal heart sounds. No murmur heard.    No friction rub. No gallop.  Pulmonary:     Effort: Pulmonary effort is normal. No respiratory distress.     Breath sounds: Normal breath sounds. No wheezing or rales.  Abdominal:     General: Bowel sounds are normal. There is no distension.     Palpations: Abdomen is soft. There is no mass.     Tenderness: There is no abdominal tenderness.  Musculoskeletal:        General: Tenderness, deformity and signs of injury present.     Cervical back: Normal range of motion and neck supple.     Right lower leg: No edema.     Left lower leg: No edema.     Comments: The patient has decreased range of motion of the right hip with significant pain, the right leg is shortened and externally rotated.  Lymphadenopathy:     Cervical: No cervical adenopathy.  Skin:    General: Skin is warm and dry.     Findings: No erythema or rash.  Neurological:     Mental Status: He is alert.     Coordination: Coordination normal.     Comments: Normal sensation to the feet, normal pulses at the feet bilaterally, normal capillary refill  Psychiatric:        Behavior: Behavior normal.     ED Results / Procedures / Treatments   Labs (all labs ordered are listed, but only abnormal results are displayed) Labs Reviewed  BASIC METABOLIC PANEL - Abnormal; Notable for the following components:      Result Value   Glucose, Bld 109 (*)    BUN 26 (*)    All other components within normal limits  CBC WITH DIFFERENTIAL/PLATELET - Abnormal; Notable for the following components:   WBC 10.8 (*)    Neutro Abs 9.6 (*)    Lymphs Abs 0.4 (*)    All other components within normal limits  PROTIME-INR - Abnormal; Notable for the following components:   Prothrombin Time 15.5 (*)    All other components within normal limits  TYPE AND SCREEN    EKG EKG  Interpretation  Date/Time:  Saturday September 30 2021 14:01:09 EDT Ventricular Rate:  136 PR Interval:    QRS Duration: 126 QT Interval:  335 QTC Calculation: 504 R Axis:   87 Text Interpretation: Atrial fibrillation  Ventricular premature complex IVCD, consider atypical RBBB Nonspecific ST depression Confirmed by Noemi Chapel [phone]) on 09/30/2021 3:05:19 PM  Radiology DG Hip Unilat With Pelvis 2-3 Views Right  Result Date: 09/30/2021 CLINICAL DATA:  Hip fracture. EXAM: DG HIP (WITH OR WITHOUT PELVIS) 2-3V RIGHT COMPARISON:  None Available. FINDINGS: There is a subcapital right hip fracture. No dislocation of the femoral head. No pelvic bone fractures identified. Limited views of the left hip are normal. IMPRESSION: Subcapital right hip fracture.  No dislocation. Electronically Signed   By: Dorise Bullion III M.D.   On: 09/30/2021 13:51   DG Chest 1 View  Result Date: 09/30/2021 CLINICAL DATA:  Fall. Hip fracture. EXAM: CHEST  1 VIEW COMPARISON:  One view chest x-ray 09/02/2019 FINDINGS: Heart size is normal. Atherosclerotic calcifications are present at the aortic arch. No edema or effusion is present. Scarring is noted at the left greater than right lung apex. Degenerative changes are present in both shoulders. IMPRESSION: 1. No acute cardiopulmonary disease. 2. Atherosclerosis. Electronically Signed   By: San Morelle M.D.   On: 09/30/2021 13:51    Procedures Procedures    Medications Ordered in ED Medications  fentaNYL (SUBLIMAZE) injection 50 mcg (50 mcg Intravenous Given 09/30/21 1409)  ondansetron (ZOFRAN) injection 4 mg (4 mg Intravenous Given 09/30/21 1410)  diltiazem (CARDIZEM) injection 20 mg (20 mg Intravenous Given 09/30/21 1454)    ED Course/ Medical Decision Making/ A&P                           Medical Decision Making Amount and/or Complexity of Data Reviewed Labs: ordered. Radiology: ordered.  Risk Prescription drug management. Decision regarding  hospitalization.   This patient presents to the ED for concern of hip injury and fall., this involves an extensive number of treatment options, and is a complaint that carries with it a high risk of complications and morbidity.  The differential diagnosis includes This patient is somewhat debilitated, unfortunately he takes Eliquis, last dose was last night.  He likely has a hip fracture given his clinical exam and will need further testing including x-rays of his chest for preop x-ray, EKG and preop labs as well as an x-ray of his hips.  If he does not have a hip fracture it is certainly questionable why he has a leg length discrepancy.    Co morbidities that complicate the patient evaluation  Eliquis Elderly Chronic hip pain Persistent atrial fibrillation   Additional history obtained:  Additional history obtained from electronic medical record, the patient has not had any recent imaging in our system of his hip. External records from outside source obtained and reviewed including prior office visits with family doctor going back through April, history of persistent A-fib followed by cardiology   Lab Tests:  I Ordered, and personally interpreted labs.  The pertinent results include: CBC without a leukocytosis, metabolic function is relatively normal   Imaging Studies ordered:  I ordered imaging studies including chest x-ray and x-ray of the right hip I independently visualized and interpreted imaging which showed subcapital fracture on the right side, clear x-ray I agree with the radiologist interpretation   Cardiac Monitoring: / EKG:  The patient was maintained on a cardiac monitor.  I personally viewed and interpreted the cardiac monitored which showed an underlying rhythm of: Atrial fibrillation, given dose of Cardizem for helping with rate control   Consultations Obtained:  I requested consultation with the orthopedist Dr. Aline Brochure,  and discussed lab and imaging  findings as well as pertinent plan - they recommend: Admission and operative therapy likely on Monday after cardiology clearance.  He feels comfortable keeping the patient here and off of the Eliquis, n.p.o. after midnight on Sunday Paged hospitalist for admission   Problem List / ED Course / Critical interventions / Medication management  Patient with hip fracture I ordered medication including pain medications for hip fracture Reevaluation of the patient after these medicines showed that the patient improved but has ongoing pain especially with any movement of the leg I have reviewed the patients home medicines and have made adjustments as needed   Social Determinants of Health:  Elderly   Test / Admission - Considered:  We will admit to the hospital         Final Clinical Impression(s) / ED Diagnoses Final diagnoses:  Closed subcapital fracture of right femur, initial encounter [facility])    Rx / DC Orders ED Discharge Orders     None         Noemi Chapel, MD 09/30/21 1506

## 2021-10-01 DIAGNOSIS — J449 Chronic obstructive pulmonary disease, unspecified: Secondary | ICD-10-CM | POA: Diagnosis not present

## 2021-10-01 DIAGNOSIS — I10 Essential (primary) hypertension: Secondary | ICD-10-CM | POA: Diagnosis not present

## 2021-10-01 DIAGNOSIS — S72001A Fracture of unspecified part of neck of right femur, initial encounter for closed fracture: Secondary | ICD-10-CM | POA: Diagnosis not present

## 2021-10-01 DIAGNOSIS — I4811 Longstanding persistent atrial fibrillation: Secondary | ICD-10-CM | POA: Diagnosis not present

## 2021-10-01 LAB — BASIC METABOLIC PANEL
Anion gap: 7 (ref 5–15)
BUN: 25 mg/dL — ABNORMAL HIGH (ref 8–23)
CO2: 26 mmol/L (ref 22–32)
Calcium: 8.6 mg/dL — ABNORMAL LOW (ref 8.9–10.3)
Chloride: 106 mmol/L (ref 98–111)
Creatinine, Ser: 1.1 mg/dL (ref 0.61–1.24)
GFR, Estimated: >60 mL/min (ref >60)
Glucose, Bld: 93 mg/dL (ref 70–99)
Potassium: 4.5 mmol/L (ref 3.5–5.1)
Sodium: 139 mmol/L (ref 135–145)

## 2021-10-01 LAB — CBC
HCT: 39.5 % (ref 39.0–52.0)
Hemoglobin: 12.9 g/dL — ABNORMAL LOW (ref 13.0–17.0)
MCH: 31.6 pg (ref 26.0–34.0)
MCHC: 32.7 g/dL (ref 30.0–36.0)
MCV: 96.8 fL (ref 80.0–100.0)
Platelets: 192 10*3/uL (ref 150–400)
RBC: 4.08 MIL/uL — ABNORMAL LOW (ref 4.22–5.81)
RDW: 13.1 % (ref 11.5–15.5)
WBC: 7.5 10*3/uL (ref 4.0–10.5)
nRBC: 0 % (ref 0.0–0.2)

## 2021-10-01 LAB — SURGICAL PCR SCREEN
MRSA, PCR: NEGATIVE
Staphylococcus aureus: NEGATIVE

## 2021-10-01 LAB — PROTIME-INR
INR: 1.2 (ref 0.8–1.2)
Prothrombin Time: 14.9 seconds (ref 11.4–15.2)

## 2021-10-01 MED ORDER — POLYETHYLENE GLYCOL 3350 17 G PO PACK
17.0000 g | PACK | Freq: Every day | ORAL | Status: DC
  Administered 2021-10-01 – 2021-10-09 (×5): 17 g via ORAL
  Filled 2021-10-01 (×8): qty 1

## 2021-10-01 MED ORDER — OXYCODONE HCL 5 MG PO TABS
10.0000 mg | ORAL_TABLET | ORAL | Status: DC | PRN
  Administered 2021-10-01 – 2021-10-05 (×8): 10 mg via ORAL
  Filled 2021-10-01 (×9): qty 2

## 2021-10-01 MED ORDER — CHLORHEXIDINE GLUCONATE 4 % EX LIQD
60.0000 mL | Freq: Once | CUTANEOUS | Status: AC
  Filled 2021-10-01: qty 15

## 2021-10-01 MED ORDER — FENTANYL CITRATE PF 50 MCG/ML IJ SOSY
50.0000 ug | PREFILLED_SYRINGE | INTRAMUSCULAR | Status: DC | PRN
  Administered 2021-10-01 – 2021-10-03 (×7): 50 ug via INTRAVENOUS
  Filled 2021-10-01 (×7): qty 1

## 2021-10-01 MED ORDER — METHOCARBAMOL 750 MG PO TABS
750.0000 mg | ORAL_TABLET | Freq: Three times a day (TID) | ORAL | Status: DC
  Administered 2021-10-01 – 2021-10-09 (×26): 750 mg via ORAL
  Filled 2021-10-01 (×3): qty 1
  Filled 2021-10-01: qty 2
  Filled 2021-10-01 (×4): qty 1
  Filled 2021-10-01 (×2): qty 2
  Filled 2021-10-01 (×4): qty 1
  Filled 2021-10-01: qty 2
  Filled 2021-10-01: qty 1
  Filled 2021-10-01: qty 2
  Filled 2021-10-01: qty 1
  Filled 2021-10-01: qty 2
  Filled 2021-10-01 (×8): qty 1

## 2021-10-01 MED ORDER — SENNOSIDES-DOCUSATE SODIUM 8.6-50 MG PO TABS
2.0000 | ORAL_TABLET | Freq: Every day | ORAL | Status: DC
  Administered 2021-10-01 – 2021-10-09 (×9): 2 via ORAL
  Filled 2021-10-01 (×9): qty 2

## 2021-10-01 MED ORDER — CLONAZEPAM 0.5 MG PO TABS
0.5000 mg | ORAL_TABLET | Freq: Two times a day (BID) | ORAL | Status: DC
  Administered 2021-10-02 – 2021-10-09 (×15): 0.5 mg via ORAL
  Filled 2021-10-01 (×15): qty 1

## 2021-10-01 NOTE — Consult Note (Signed)
Reason for Consult: Right hip fracture Referring Physician: COURAGE EMOKEPAE  Jackson Martin is an 81 y.o. male.  HPI: 81 year old male history of atrial fibrillation, on Eliquis and Cardizem, COPD without oxygen requirement, hypertension, ischemic heart disease with previous MI status post 4 stents placed in 2007 presents with right hip fracture on July 30 but injury occurred July 29 patient fell was by himself as he lives alone and laid on the ground for several hours  Last Eliquis was July 28 in the evening   Past Medical History:  Diagnosis Date   Anxiety    Asthmatic bronchitis    Atrial fibrillation ([place])    Backache    Colon polyps    COPD (chronic obstructive pulmonary disease) ([place])    Diverticulitis 12/2019   Esophageal reflux    Essential hypertension    Gout    Hemorrhoid    Hyperlipidemia    Insomnia    Ischemic heart disease    MI (myocardial infarction) ([place]) 2003   Neck pain    Plantar fasciitis    Prostate cancer [facility])     Past Surgical History:  Procedure Laterality Date   COLONOSCOPY     COLONOSCOPY N/A 11/15/2016   Procedure: COLONOSCOPY;  Surgeon: Rogene Houston, MD;  Location[facility];  Service: Endoscopy;  Laterality: N/A;  1200   COLONOSCOPY WITH PROPOFOL N/A 01/20/2020   Procedure: COLONOSCOPY WITH PROPOFOL;  Surgeon: Rogene Houston, MD;  Location: [facility];  Service: Endoscopy;  Laterality: N/A;  1:15, pt refused to change time   CORONARY ANGIOPLASTY WITH STENT PLACEMENT  2007   4 stents placed   POLYPECTOMY  11/15/2016   Procedure: POLYPECTOMY;  Surgeon: Rogene Houston, MD;  Location: [facility];  Service: Endoscopy;;  colon   POLYPECTOMY  01/20/2020   Procedure: POLYPECTOMY;  Surgeon: Rogene Houston, MD;  [facility]: [facility];  Service: Endoscopy;;   PROSTATECTOMY  2001    Family History  Problem Relation Age of Onset   Hypertension Mother    Heart disease Mother    Heart disease Maternal Aunt    Heart disease  Maternal Uncle    Heart disease Maternal Grandmother    Heart disease Maternal Grandfather    Colon cancer Brother    Breast cancer Sister    Lung cancer Sister    Lung disease Sister     Social History:  reports that he quit smoking about 30 years ago. His smoking use included cigarettes. He has a 2.25 pack-year smoking history. He has never used smokeless tobacco. He reports that he does not drink alcohol and does not use drugs.  Allergies: No Known Allergies  Medications:   Current Outpatient Medications  Medication Instructions   alfuzosin (UROXATRAL) 10 mg, Oral, Daily   allopurinol (ZYLOPRIM) 300 mg, Oral, Daily   apixaban (ELIQUIS) 5 mg, Oral, 2 times daily   atorvastatin (LIPITOR) 20 mg, Oral, Daily at bedtime   buPROPion (WELLBUTRIN XL) 150 mg, Oral, Daily   citalopram (CELEXA) 20 mg, Oral, Daily   clonazePAM (KLONOPIN) 0.5 mg, Oral, 3 times daily   clotrimazole-betamethasone (LOTRISONE) cream 1 Application, Topical, Daily   diltiazem (CARDIZEM CD) 240 mg, Oral, Daily   Elastic Bandages & Supports (MEDICAL COMPRESSION STOCKINGS) MISC 1 each, Does not apply, As directed, 1 pair of low pressure below the knee compression stockings<BR>Dx: mild orthostatic hypotension   HYDROcodone-acetaminophen (NORCO/VICODIN) 5-325 MG tablet 1 tablet, Oral, 2 times daily PRN   magnesium hydroxide (  MILK OF MAGNESIA) 400 MG/5ML suspension 15 mLs, Oral, At bedtime PRN   montelukast (SINGULAIR) 10 mg, Oral, Daily   psyllium (METAMUCIL SMOOTH TEXTURE) 58.6 % powder 1 packet, Oral, Daily   sertraline (ZOLOFT) 100 mg, Oral, Daily PRN     No Known Allergies   Results for orders placed or performed during the hospital encounter of 09/30/21 (from the past 48 hour(s))  Basic metabolic panel     Status: Abnormal   Collection Time: 09/30/21  1:27 PM  Result Value Ref Range   Sodium 140 135 - 145 mmol/L   Potassium 3.9 3.5 - 5.1 mmol/L   Chloride 106 98 - 111 mmol/L   CO2 26 22 - 32 mmol/L    Glucose, Bld 109 (H) 70 - 99 mg/dL    Comment: Glucose reference range applies only to samples taken after fasting for at least 8 hours.   BUN 26 (H) 8 - 23 mg/dL   Creatinine, Ser 1.17 0.61 - 1.24 mg/dL   Calcium 9.4 8.9 - 10.3 mg/dL   GFR, Estimated >60 >60 mL/min    Comment: (NOTE) Calculated using the CKD-EPI Creatinine Equation (2021)    Anion gap 8 5 - 15    Comment: Performed at [facility], [address], [place], [place] [address]  CBC with Differential     Status: Abnormal   Collection Time: 09/30/21  1:27 PM  Result Value Ref Range   WBC 10.8 (H) 4.0 - 10.5 K/uL   RBC 4.50 4.22 - 5.81 MIL/uL   Hemoglobin 14.0 13.0 - 17.0 g/dL   HCT 42.1 39.0 - 52.0 %   MCV 93.6 80.0 - 100.0 fL   MCH 31.1 26.0 - 34.0 pg   MCHC 33.3 30.0 - 36.0 g/dL   RDW 13.0 11.5 - 15.5 %   Platelets 219 150 - 400 K/uL   nRBC 0.0 0.0 - 0.2 %   Neutrophils Relative % 89 %   Neutro Abs 9.6 (H) 1.7 - 7.7 K/uL   Lymphocytes Relative 4 %   Lymphs Abs 0.4 (L) 0.7 - 4.0 K/uL   Monocytes Relative 7 %   Monocytes Absolute 0.8 0.1 - 1.0 K/uL   Eosinophils Relative 0 %   Eosinophils Absolute 0.0 0.0 - 0.5 K/uL   Basophils Relative 0 %   Basophils Absolute 0.0 0.0 - 0.1 K/uL   Immature Granulocytes 0 %   Abs Immature Granulocytes 0.02 0.00 - 0.07 K/uL    Comment: Performed at [facility], [address], [place], [place] [address]  Protime-INR     Status: Abnormal   Collection Time: 09/30/21  1:27 PM  Result Value Ref Range   Prothrombin Time 15.5 (H) 11.4 - 15.2 seconds   INR 1.2 0.8 - 1.2    Comment: (NOTE) INR goal varies based on device and disease states. Performed at [facility], [address] [place], [place] [address]   Type and screen [facility]     Status: None   Collection Time: 09/30/21  1:27 PM  Result Value Ref Range   ABO/RH(D) O NEG    Antibody Screen NEG    Sample Expiration      10/03/2021,2359 Performed at [facility], [address] [place], [place] [address]   CK      Status: Abnormal   Collection Time: 09/30/21  1:27 PM  Result Value Ref Range   Total CK 1,581 (H) 49 - 397 U/L    Comment: Performed at [facility],  [place], [place] [address]  Basic metabolic panel     Status: Abnormal   Collection Time: 10/01/21  5:38 AM  Result Value Ref Range   Sodium 139 135 - 145 mmol/L   Potassium 4.5 3.5 - 5.1 mmol/L   Chloride 106 98 - 111 mmol/L   CO2 26 22 - 32 mmol/L   Glucose, Bld 93 70 - 99 mg/dL    Comment: Glucose reference range applies only to samples taken after fasting for at least 8 hours.   BUN 25 (H) 8 - 23 mg/dL   Creatinine, Ser 1.10 0.61 - 1.24 mg/dL   Calcium 8.6 (L) 8.9 - 10.3 mg/dL   GFR, Estimated >60 >60 mL/min    Comment: (NOTE) Calculated using the CKD-EPI Creatinine Equation (2021)    Anion gap 7 5 - 15    Comment: Performed at [facility], [address] [place], [place] [address]  CBC     Status: Abnormal   Collection Time: 10/01/21  5:38 AM  Result Value Ref Range   WBC 7.5 4.0 - 10.5 K/uL   RBC 4.08 (L) 4.22 - 5.81 MIL/uL   Hemoglobin 12.9 (L) 13.0 - 17.0 g/dL   HCT 39.5 39.0 - 52.0 %   MCV 96.8 80.0 - 100.0 fL   MCH 31.6 26.0 - 34.0 pg   MCHC 32.7 30.0 - 36.0 g/dL   RDW 13.1 11.5 - 15.5 %   Platelets 192 150 - 400 K/uL   nRBC 0.0 0.0 - 0.2 %    Comment: Performed at [facility], [address], [place], [place] [address]  Protime-INR     Status: None   Collection Time: 10/01/21  5:38 AM  Result Value Ref Range   Prothrombin Time 14.9 11.4 - 15.2 seconds   INR 1.2 0.8 - 1.2    Comment: (NOTE) INR goal varies based on device and disease states. Performed at [facility], [address], [place], [place] [address]     DG Hip Unilat With Pelvis 2-3 Views Right  Addendum Date: 09/30/2021   ADDENDUM REPORT: 09/30/2021 16:00 ADDENDUM: The patient was sent for an additional lateral view of the right hip. Poor penetration markedly limits evaluation of the additional lateral view. The femoral head is  not well visualized on the additional lateral view. No additional fractures are noted. Electronically Signed   By: Dorise Bullion III M.D.   On: 09/30/2021 16:00   Result Date: 09/30/2021 CLINICAL DATA:  Hip fracture. EXAM: DG HIP (WITH OR WITHOUT PELVIS) 2-3V RIGHT COMPARISON:  None Available. FINDINGS: There is a subcapital right hip fracture. No dislocation of the femoral head. No pelvic bone fractures identified. Limited views of the left hip are normal. IMPRESSION: Subcapital right hip fracture.  No dislocation. Electronically Signed: By: Dorise Bullion III M.D. On: 09/30/2021 13:51   DG Chest 1 View  Result Date: 09/30/2021 CLINICAL DATA:  Fall. Hip fracture. EXAM: CHEST  1 VIEW COMPARISON:  One view chest x-ray 09/02/2019 FINDINGS: Heart size is normal. Atherosclerotic calcifications are present at the aortic arch. No edema or effusion is present. Scarring is noted at the left greater than right lung apex. Degenerative changes are present in both shoulders. IMPRESSION: 1. No acute cardiopulmonary disease. 2. Atherosclerosis. Electronically Signed   By: San Morelle M.D.   On: 09/30/2021 13:51    Review of Systems apparently the patient was seeing physical therapist for pain in his right hip   Blood pressure 128/74, pulse Marland Kitchen)  105, temperature 98.1 F (36.7 C), temperature source Oral, resp. rate 17, height '5\' 10"'$  (1.778 m), weight 68 kg, SpO2 94 %. Physical Exam  Constitutional: General appearance ectomorphic no gross deformities other than the shortening external rotation of the right leg  Cardiovascular no swelling or varicosities; palpation of pulses normal temperature normal without edema or tenderness  Lymph nodes groin normal  Skin integrity normal in all 4 extremities  Neuro coordination finger-to-nose normal; deep tendon reflexes normal upper extremities, sensation normal all 4 extremities  Psych alert and oriented x 3, no evidence of depression anxiety  agitation  Musculoskeletal gait unable to ambulate  Right and left upper extremity as well as left lower extremity  Inspection and palpation normal, Range of motion no contractures and no subluxation of the joints and muscle tone normal without tremor  Right lower extremity tenderness proximal femur external rotation shortening classic of hip fracture.  Range of motion assessment deferred because of the fracture muscle tone normal knee and ankle normal alignment  Assessment/Plan:  Right hip and pelvis x-ray.  My independent interpretation: Right femoral neck fracture 100% displacement no evidence of hip arthritis  81 year old male on Eliquis history of heart disease stent placement MI hypertension COPD without oxygen requirement lives alone has started to use a walker recently fell laid on the ground for several hours brought in for evaluation found to have a hip fracture  The Eliquis will need to be stopped for 48 hours.  He also needs a cardiology consult which will delay his surgery for 24 hours.  After cardiology sees him if cleared for surgery here we can do the right hip bipolar replacement   The procedure has been fully reviewed with the patient; The risks and benefits of surgery have been discussed and explained and understood. Alternative treatment has also been reviewed, questions were encouraged and answered. The postoperative plan is also been reviewed.   Arther Abbott 10/01/2021, 9:49 AM

## 2021-10-01 NOTE — Plan of Care (Signed)
  Problem: Clinical Measurements: Goal: Will remain free from infection Outcome: Progressing   Problem: Clinical Measurements: Goal: Ability to maintain clinical measurements within normal limits will improve Outcome: Progressing   Problem: Clinical Measurements: Goal: Cardiovascular complication will be avoided Outcome: Progressing   Problem: Activity: Goal: Risk for activity intolerance will decrease Outcome: Progressing   Problem: Nutrition: Goal: Adequate nutrition will be maintained Outcome: Progressing

## 2021-10-01 NOTE — Progress Notes (Signed)
PROGRESS NOTE     Jackson Martin, is a 81 y.o. male, DOB - [birth year], IRC:[id number]  Admit date - 09/30/2021   Admitting Physician Jackson Mainland, DO  Outpatient Primary MD for the patient is Jackson Blitz, MD  LOS - 1  Chief Complaint  Patient presents with   Fall        Brief Narrative:   81 y.o. male with medical history significant of coronary artery disease and history of MI, atrial fibrillation rate controlled on Eliquis, HLD, HTN,  anxiety, history of prostate cancer admitted on 09/30/2021 with right hip fracture after mechanical fall at home     -Assessment and Plan: 1)Rt hip fracture (subcapital)--- status post mechanical fall -Further management per orthopedic team -Last dose of Eliquis 09/29/2021 PM dose --10/01/21  ---Right hip pain persist.... Patient reports poorly controlled right hip pain -Pain medications adjusted as follows okay to use IV fentanyl and p.o. oxycodone and methocarbamol for pain control -Orthopedic team requesting cardiology clearance/preop -Echo pending  2)H/o CAD/prior MI and prior stents--- no ACS symptoms -Cardiology preop clearance requested by orthopedic team pending -Echo pending Patient does not appear to be on antiplatelet agent, patient does not appear to be on statins or beta-blockers---??? Why -Await echo and cardiology input  3)PAFib--- continue Cardizem for rate control -Eliquis on hold due to right hip fracture -Last dose of Eliquis was PM dose of 09/29/2021  4)COPD--- no acute exacerbation -Continue bronchodilators  5) anxiety disorder--continue Zoloft and Klonopin  Disposition/Need for in-Hospital Stay- patient unable to be discharged at this time due to --right hip fracture awaiting operative fixation after cardiology preop clearance -Anticipate the patient will need SNF rehab  Status is: Inpatient   Disposition: The patient is from: Home              Anticipated d/c is to: SNF              Anticipated d/c date is:  2 days              Patient currently is not medically stable to d/c. Barriers: Not Clinically Stable-   Code Status :  -  Code Status: Full Code   Family Communication:    NA (patient is alert, awake and coherent)   DVT Prophylaxis  :   - SCDs  SCDs Start: 09/30/21 1823   Lab Results  Component Value Date   PLT 192 10/01/2021    Inpatient Medications  Scheduled Meds:  [START ON 10/02/2021] clonazePAM  0.5 mg Oral BID   diltiazem  240 mg Oral Daily   fluticasone furoate-vilanterol  1 puff Inhalation Daily   methocarbamol  750 mg Oral TID   montelukast  10 mg Oral QHS   polyethylene glycol  17 g Oral Daily   senna-docusate  2 tablet Oral QHS   sertraline  100 mg Oral Daily   Continuous Infusions: PRN Meds:.fentaNYL (SUBLIMAZE) injection, ondansetron **OR** ondansetron (ZOFRAN) IV, oxyCODONE   Anti-infectives (From admission, onward)    None         Subjective: Jackson Martin today has no fevers, no emesis,  No chest pain,   -Right hip pain persist.... Patient reports poorly controlled right hip pain -No dyspnea on the palpitations no dizziness -   Objective: Vitals:   09/30/21 2217 10/01/21 0425 10/01/21 0726 10/01/21 1233  BP: 133/79 128/74  126/81  Pulse: 99 (!) 105  (!) 107  Resp: '16 17  19  '$ Temp:  98.1 F (36.7 C)  98.4 F (36.9 C)  TempSrc:  Oral    SpO2: 98% 93% 94% 92%  Weight:      Height:        Intake/Output Summary (Last 24 hours) at 10/01/2021 1645 Last data filed at 10/01/2021 1300 Gross per 24 hour  Intake 480 ml  Output --  Net 480 ml   Filed Weights   09/30/21 1319  Weight: 68 kg    Physical Exam  Gen:- Awake Alert,  in no apparent distress  HEENT:- [place].AT, No sclera icterus Neck-Supple Neck,No JVD,.  Lungs-  CTAB , fair symmetrical air movement CV- S1, S2 normal, regular  Abd-  +ve B.Sounds, Abd Soft, No tenderness,    Extremity/Skin:- No  edema, pedal pulses present  Psych-affect is appropriate, oriented x3 Neuro-no new  focal deficits, no tremors MSK--right hip pain  Data Reviewed: I have personally reviewed following labs and imaging studies  CBC: Recent Labs  Lab 09/30/21 1327 10/01/21 0538  WBC 10.8* 7.5  NEUTROABS 9.6*  --   HGB 14.0 12.9*  HCT 42.1 39.5  MCV 93.6 96.8  PLT 219 169   Basic Metabolic Panel: Recent Labs  Lab 09/30/21 1327 10/01/21 0538  NA 140 139  K 3.9 4.5  CL 106 106  CO2 26 26  GLUCOSE 109* 93  BUN 26* 25*  CREATININE 1.17 1.10  CALCIUM 9.4 8.6*   GFR: Estimated Creatinine Clearance: 49.8 mL/min (by C-G formula based on SCr of 1.1 mg/dL). Liver Function Tests: No results for input(s): "AST", "ALT", "ALKPHOS", "BILITOT", "PROT", "ALBUMIN" in the last 168 hours. Cardiac Enzymes: Recent Labs  Lab 09/30/21 1327  CKTOTAL 1,581*   BNP (last 3 results) No results for input(s): "PROBNP" in the last 8760 hours. HbA1C: No results for input(s): "HGBA1C" in the last 72 hours. Sepsis Labs: '@LABRCNTIP'$ (procalcitonin:4,lacticidven:4) ) Recent Results (from the past 240 hour(s))  Surgical PCR screen     Status: None   Collection Time: 10/01/21  2:01 PM   Specimen: Nasal Mucosa; Nasal Swab  Result Value Ref Range Status   MRSA, PCR NEGATIVE NEGATIVE Final   Staphylococcus aureus NEGATIVE NEGATIVE Final    Comment: (NOTE) The Xpert SA Assay (FDA approved for NASAL specimens in patients 55 years of age and older), is one component of a comprehensive surveillance program. It is not intended to diagnose infection nor to guide or monitor treatment. Performed at [facility], [address], [place], [place] [address]     Radiology Studies: DG Hip Unilat With Pelvis 2-3 Views Right  Addendum Date: 09/30/2021   ADDENDUM REPORT: 09/30/2021 16:00 ADDENDUM: The patient was sent for an additional lateral view of the right hip. Poor penetration markedly limits evaluation of the additional lateral view. The femoral head is not well visualized on the additional lateral view.  No additional fractures are noted. Electronically Signed   By: Jackson Martin M.D.   On: 09/30/2021 16:00   Result Date: 09/30/2021 CLINICAL DATA:  Hip fracture. EXAM: DG HIP (WITH OR WITHOUT PELVIS) 2-3V RIGHT COMPARISON:  None Available. FINDINGS: There is a subcapital right hip fracture. No dislocation of the femoral head. No pelvic bone fractures identified. Limited views of the left hip are normal. IMPRESSION: Subcapital right hip fracture.  No dislocation. Electronically Signed: By: Jackson Martin M.D. On: 09/30/2021 13:51   DG Chest 1 View  Result Date: 09/30/2021 CLINICAL DATA:  Fall. Hip fracture. EXAM: CHEST  1 VIEW COMPARISON:  One view chest x-ray 09/02/2019 FINDINGS: Heart  size is normal. Atherosclerotic calcifications are present at the aortic arch. No edema or effusion is present. Scarring is noted at the left greater than right lung apex. Degenerative changes are present in both shoulders. IMPRESSION: 1. No acute cardiopulmonary disease. 2. Atherosclerosis. Electronically Signed   By: San Morelle M.D.   On: 09/30/2021 13:51     Scheduled Meds:  [START ON 10/02/2021] clonazePAM  0.5 mg Oral BID   diltiazem  240 mg Oral Daily   fluticasone furoate-vilanterol  1 puff Inhalation Daily   methocarbamol  750 mg Oral TID   montelukast  10 mg Oral QHS   polyethylene glycol  17 g Oral Daily   senna-docusate  2 tablet Oral QHS   sertraline  100 mg Oral Daily   Continuous Infusions:   LOS: 1 day    Roxan Hockey M.D on 10/01/2021 at 4:45 PM  Go to [email or web address] - for contact info  [facility] - Office  [phone]  If 7PM-7AM, please contact night-coverage [email or web address] 10/01/2021, 4:45 PM

## 2021-10-02 ENCOUNTER — Encounter (HOSPITAL_COMMUNITY): Payer: Self-pay | Admitting: Family Medicine

## 2021-10-02 ENCOUNTER — Encounter (HOSPITAL_COMMUNITY): Payer: Self-pay | Admitting: Anesthesiology

## 2021-10-02 ENCOUNTER — Inpatient Hospital Stay (HOSPITAL_COMMUNITY): Payer: Medicare HMO

## 2021-10-02 ENCOUNTER — Encounter (HOSPITAL_COMMUNITY)
Admission: EM | Disposition: A | Discharge status: Skilled Nursing Facility | Payer: Self-pay | Source: Home / Self Care | Attending: Family Medicine

## 2021-10-02 ENCOUNTER — Other Ambulatory Visit (HOSPITAL_COMMUNITY): Payer: Self-pay | Admitting: *Deleted

## 2021-10-02 DIAGNOSIS — I350 Nonrheumatic aortic (valve) stenosis: Secondary | ICD-10-CM | POA: Diagnosis not present

## 2021-10-02 DIAGNOSIS — I2510 Atherosclerotic heart disease of native coronary artery without angina pectoris: Secondary | ICD-10-CM | POA: Diagnosis not present

## 2021-10-02 DIAGNOSIS — I4811 Longstanding persistent atrial fibrillation: Secondary | ICD-10-CM | POA: Diagnosis not present

## 2021-10-02 DIAGNOSIS — I4891 Unspecified atrial fibrillation: Secondary | ICD-10-CM

## 2021-10-02 DIAGNOSIS — Z01810 Encounter for preprocedural cardiovascular examination: Secondary | ICD-10-CM | POA: Diagnosis not present

## 2021-10-02 DIAGNOSIS — R9431 Abnormal electrocardiogram [ECG] [EKG]: Secondary | ICD-10-CM

## 2021-10-02 DIAGNOSIS — J449 Chronic obstructive pulmonary disease, unspecified: Secondary | ICD-10-CM | POA: Diagnosis not present

## 2021-10-02 DIAGNOSIS — E785 Hyperlipidemia, unspecified: Secondary | ICD-10-CM

## 2021-10-02 DIAGNOSIS — I10 Essential (primary) hypertension: Secondary | ICD-10-CM | POA: Diagnosis not present

## 2021-10-02 DIAGNOSIS — S72001A Fracture of unspecified part of neck of right femur, initial encounter for closed fracture: Secondary | ICD-10-CM | POA: Diagnosis not present

## 2021-10-02 LAB — CBC
HCT: 39 % (ref 39.0–52.0)
Hemoglobin: 12.7 g/dL — ABNORMAL LOW (ref 13.0–17.0)
MCH: 31.4 pg (ref 26.0–34.0)
MCHC: 32.6 g/dL (ref 30.0–36.0)
MCV: 96.3 fL (ref 80.0–100.0)
Platelets: 220 10*3/uL (ref 150–400)
RBC: 4.05 MIL/uL — ABNORMAL LOW (ref 4.22–5.81)
RDW: 13 % (ref 11.5–15.5)
WBC: 8.5 10*3/uL (ref 4.0–10.5)
nRBC: 0 % (ref 0.0–0.2)

## 2021-10-02 LAB — ECHOCARDIOGRAM COMPLETE
AR max vel: 1.04 cm2
AV Area VTI: 1.23 cm2
AV Area mean vel: 1.12 cm2
AV Mean grad: 11.5 mmHg
AV Peak grad: 25.6 mmHg
Ao pk vel: 2.53 m/s
Area-P 1/2: 3.37 cm2
Height: 70 in
S' Lateral: 2.6 cm
Weight: 2400 oz

## 2021-10-02 LAB — RENAL FUNCTION PANEL
Albumin: 3.7 g/dL (ref 3.5–5.0)
Anion gap: 8 (ref 5–15)
BUN: 28 mg/dL — ABNORMAL HIGH (ref 8–23)
CO2: 28 mmol/L (ref 22–32)
Calcium: 9.1 mg/dL (ref 8.9–10.3)
Chloride: 105 mmol/L (ref 98–111)
Creatinine, Ser: 1.13 mg/dL (ref 0.61–1.24)
GFR, Estimated: >60 mL/min (ref >60)
Glucose, Bld: 111 mg/dL — ABNORMAL HIGH (ref 70–99)
Phosphorus: 3 mg/dL (ref 2.5–4.6)
Potassium: 4.2 mmol/L (ref 3.5–5.1)
Sodium: 141 mmol/L (ref 135–145)

## 2021-10-02 LAB — GLUCOSE, CAPILLARY
Glucose-Capillary: 126 mg/dL — ABNORMAL HIGH (ref 70–99)
Glucose-Capillary: 127 mg/dL — ABNORMAL HIGH (ref 70–99)

## 2021-10-02 SURGERY — HEMIARTHROPLASTY, HIP, DIRECT ANTERIOR APPROACH, FOR FRACTURE
Anesthesia: Choice | Site: Hip | Laterality: Right

## 2021-10-02 MED ORDER — BUPIVACAINE LIPOSOME 1.3 % IJ SUSP
INTRAMUSCULAR | Status: AC
  Filled 2021-10-02: qty 20

## 2021-10-02 MED ORDER — TRANEXAMIC ACID-NACL 1000-0.7 MG/100ML-% IV SOLN: 1000.0000 mg | INTRAVENOUS | Status: DC

## 2021-10-02 MED ORDER — CEFAZOLIN SODIUM-DEXTROSE 2-4 GM/100ML-% IV SOLN: 2.0000 g | INTRAVENOUS | Status: DC

## 2021-10-02 MED ORDER — REGADENOSON 0.4 MG/5ML IV SOLN
0.4000 mg | Freq: Once | INTRAVENOUS | Status: DC
  Filled 2021-10-02: qty 5

## 2021-10-02 MED ORDER — BUPIVACAINE-EPINEPHRINE (PF) 0.5% -1:200000 IJ SOLN
INTRAMUSCULAR | Status: AC
  Filled 2021-10-02: qty 30

## 2021-10-02 MED ORDER — CHLORHEXIDINE GLUCONATE 4 % EX LIQD
CUTANEOUS | Status: AC
  Administered 2021-10-02: 4 via TOPICAL
  Filled 2021-10-02: qty 15

## 2021-10-02 MED ORDER — HEPARIN SODIUM (PORCINE) 5000 UNIT/ML IJ SOLN
5000.0000 [IU] | Freq: Once | INTRAMUSCULAR | Status: AC
  Administered 2021-10-02: 5000 [IU] via SUBCUTANEOUS
  Filled 2021-10-02: qty 1

## 2021-10-02 MED ORDER — TRANEXAMIC ACID-NACL 1000-0.7 MG/100ML-% IV SOLN
INTRAVENOUS | Status: AC
  Filled 2021-10-02: qty 100

## 2021-10-02 MED ORDER — POVIDONE-IODINE 10 % EX SWAB: 2.0000 | Freq: Once | CUTANEOUS | Status: DC

## 2021-10-02 MED ORDER — METOPROLOL TARTRATE 50 MG PO TABS
50.0000 mg | ORAL_TABLET | Freq: Once | ORAL | Status: AC
  Administered 2021-10-02: 50 mg via ORAL
  Filled 2021-10-02: qty 1

## 2021-10-02 MED ORDER — SODIUM CHLORIDE 0.9 % IV SOLN: INTRAVENOUS | Status: DC

## 2021-10-02 MED ORDER — CEFAZOLIN SODIUM-DEXTROSE 2-4 GM/100ML-% IV SOLN
INTRAVENOUS | Status: AC
  Filled 2021-10-02: qty 100

## 2021-10-02 NOTE — Anesthesia Preprocedure Evaluation (Deleted)
Anesthesia Evaluation  Patient identified by MRN, date of birth, ID band Patient awake    Reviewed: Allergy & Precautions, H&P , NPO status , Patient's Chart, lab work & pertinent test results, reviewed documented beta blocker date and time   Airway Mallampati: II  TM Distance: >3 FB Neck ROM: full    Dental no notable dental hx.    Pulmonary asthma , COPD, former smoker,    Pulmonary exam normal breath sounds clear to auscultation       Cardiovascular Exercise Tolerance: Good hypertension, + dysrhythmias Atrial Fibrillation  Rhythm:irregular Rate:Normal     Neuro/Psych PSYCHIATRIC DISORDERS Anxiety negative neurological ROS     GI/Hepatic Neg liver ROS, PUD, GERD  Medicated,  Endo/Other  negative endocrine ROS  Renal/GU negative Renal ROS  negative genitourinary   Musculoskeletal   Abdominal   Peds  Hematology negative hematology ROS (+)   Anesthesia Other Findings   Reproductive/Obstetrics negative OB ROS                             Anesthesia Physical Anesthesia Plan  ASA: 3  Anesthesia Plan: General and General ETT   Post-op Pain Management:    Induction:   PONV Risk Score and Plan: Ondansetron  Airway Management Planned:   Additional Equipment:   Intra-op Plan:   Post-operative Plan:   Informed Consent: I have reviewed the patients History and Physical, chart, labs and discussed the procedure including the risks, benefits and alternatives for the proposed anesthesia with the patient or authorized representative who has indicated his/her understanding and acceptance.     Dental Advisory Given  Plan Discussed with: CRNA  Anesthesia Plan Comments:         Anesthesia Quick Evaluation

## 2021-10-02 NOTE — Progress Notes (Signed)
Patients heart rate running between 104-123, patient reported complaints of pain 5/10, refused pain medication earlier in shift. MD Courage made aware.

## 2021-10-02 NOTE — Progress Notes (Signed)
*  PRELIMINARY RESULTS* Echocardiogram 2D Echocardiogram has been performed.  Jackson Martin 10/02/2021, 11:29 AM

## 2021-10-02 NOTE — TOC Initial Note (Signed)
Transition of Care [facility]) - Initial/Assessment Note    Patient Details  Name: Jackson Martin MRN: [id number] Date of Birth: 1940-09-06  Transition of Care [facility]) CM/SW Contact:    Boneta Lucks, RN Phone Number: 10/02/2021, 12:59 PM  Clinical Narrative:      Patient is in [place], admitted with Closed right hip fracture. Likely with need SNF.  TOC submitted for PASSR# and started FL2, TOC to follow.         Expected Discharge Plan: Skilled Nursing Facility Barriers to Discharge: Continued Medical Work up   Patient Goals and CMS Choice Patient states their goals for this hospitalization and ongoing recovery are:: to get better.     Expected Discharge Plan and Services Expected Discharge Plan: [place]       Activities of Daily Living Home Assistive Devices/Equipment: None ADL Screening (condition at time of admission) Patient's cognitive ability adequate to safely complete daily activities?: No Is the patient deaf or have difficulty hearing?: No Does the patient have difficulty seeing, even when wearing glasses/contacts?: No Does the patient have difficulty concentrating, remembering, or making decisions?: No Patient able to express need for assistance with ADLs?: Yes Does the patient have difficulty dressing or bathing?: No Independently performs ADLs?: Yes (appropriate for developmental age) Does the patient have difficulty walking or climbing stairs?: No Weakness of Legs: Both Weakness of Arms/Hands: None   Admission diagnosis:  Closed right hip fracture ([place]) [S72.001A] Closed subcapital fracture of right femur, initial encounter ([place]) [S72.011A] Patient Active Problem List   Diagnosis Date Noted   Closed right hip fracture ([place]) 09/30/2021   Anxiety 09/30/2021   Atrial fibrillation ([place]) 09/30/2021   Essential hypertension 09/30/2021   Hyperlipidemia 09/30/2021   Elevated alkaline phosphatase level 01/31/2021   Constipation 05/31/2020   Ulcerated  external hemorrhoids 05/31/2020   Diverticulitis 09/02/2019   Blood in stool 09/23/2017   Dyspnea on exertion 12/25/2016   COPD  GOLD 0/ ? AB 12/25/2016   Gastrointestinal hemorrhage 10/29/2016   History of MI (myocardial infarction) 2003   PCP:  Monico Blitz, MD Pharmacy:   [facility], [place] [address] [place] [address] Phone: [phone] Fax: [phone]    Readmission Risk Interventions    10/02/2021   12:58 PM  Readmission Risk Prevention Plan  Post Dischage Appt Not Complete  Medication Screening Complete  Transportation Screening Complete

## 2021-10-02 NOTE — Progress Notes (Signed)
Pt procedure cancelled. Dr. Briant Cedar into see pt & explain. Pt verbalized understanding. Report given to [facility], LPN.

## 2021-10-02 NOTE — Consult Note (Addendum)
Cardiology Consultation:   Patient ID: Jackson Martin MRN: [id number]; DOB: June 11, 1940  Admit date: 09/30/2021 Date of Consult: 10/02/2021  PCP:  Monico Blitz, [facility] Providers Cardiologist:  Rozann Lesches, MD        Patient Profile:   Jackson Martin is a 81 y.o. male with a hx of Afib on eliquis who is being seen 10/02/2021 for the evaluation of preop clearance for hip arthroplasty after fall/fracture at the request of [facility] .  History of Present Illness:   Jackson Martin is an 81 yo with history of Persistent AF on Eliquis, ischemic heart disease in PMH but unclear what has been done in the past, HTN, HLD, anxiety.  Patient admitted 09/30/21 with and right hip fracture for surgery today. Last eliquis dose 09/29/21. Patient tells me he got up from his recliner and walked across the room, became dizzy and hit the floor falling backwards. ER reports he was found 12 hrs later by family. He says he has no help or family and he does no cooking or cleaning and his house is a mess. He orders take out and says he doesn't eat much. He says he had a heart attack more than 10 yrs ago and went to [facility] and life flight to [place] where he had 4 stents put in. No records in care everywhere. He denies chest pain but stopped walking his daily 1 block 4 months ago because of DOE. Can go up a flight of stairs but sometimes has to stop due to SOB. Patient says he's been dizzy for a couple years.   Past Medical History:  Diagnosis Date   Anxiety    Asthmatic bronchitis    Atrial fibrillation (HCC)    Backache    Colon polyps    COPD (chronic obstructive pulmonary disease) ([place])    Diverticulitis 12/2019   Esophageal reflux    Essential hypertension    Gout    Hemorrhoid    Hyperlipidemia    Insomnia    Ischemic heart disease    MI (myocardial infarction) ([place]) 2003   Neck pain    Plantar fasciitis    Prostate cancer [facility])     Past Surgical History:  Procedure  Laterality Date   COLONOSCOPY     COLONOSCOPY N/A 11/15/2016   Procedure: COLONOSCOPY;  Surgeon: Rogene Houston, MD;  [facility];  Service: Endoscopy;  Laterality: N/A;  1200   COLONOSCOPY WITH PROPOFOL N/A 01/20/2020   Procedure: COLONOSCOPY WITH PROPOFOL;  Surgeon: Rogene Houston, MD;  Location: [facility];  Service: Endoscopy;  Laterality: N/A;  1:15, pt refused to change time   CORONARY ANGIOPLASTY WITH STENT PLACEMENT  2007   4 stents placed   POLYPECTOMY  11/15/2016   Procedure: POLYPECTOMY;  Surgeon: Rogene Houston, MD;  Location: [facility];  Service: Endoscopy;;  colon   POLYPECTOMY  01/20/2020   Procedure: POLYPECTOMY;  Surgeon: Rogene Houston, MD;  [facility];  Service: Endoscopy;;   PROSTATECTOMY  2001     Home Medications:  Prior to Admission medications   Medication Sig Start Date End Date Taking? Authorizing Provider  alfuzosin (UROXATRAL) 10 MG 24 hr tablet Take 10 mg by mouth daily. 06/15/21  Yes [provider]  allopurinol (ZYLOPRIM) 300 MG tablet Take 300 mg by mouth daily.    Yes [provider]  apixaban (ELIQUIS) 5 MG TABS tablet Take 1 tablet (5 mg total) by mouth  2 (two) times daily. 08/02/21  Yes Satira Sark, MD  atorvastatin (LIPITOR) 20 MG tablet Take 20 mg by mouth at bedtime.    Yes [provider]  buPROPion (WELLBUTRIN XL) 150 MG 24 hr tablet Take 150 mg by mouth daily. 09/11/21  Yes [provider]  citalopram (CELEXA) 20 MG tablet Take 20 mg by mouth daily.   Yes [provider]  clonazePAM (KLONOPIN) 0.5 MG tablet Take 0.5 mg by mouth 3 (three) times daily.  10/19/16  Yes [provider]  diltiazem (CARDIZEM CD) 240 MG 24 hr capsule Take 1 capsule (240 mg total) by mouth daily. 01/16/21  Yes Satira Sark, MD  HYDROcodone-acetaminophen (NORCO/VICODIN) 5-325 MG tablet Take 1 tablet by mouth 2 (two) times daily as needed. 09/29/21  Yes [provider]   magnesium hydroxide (MILK OF MAGNESIA) 400 MG/5ML suspension Take 15 mLs by mouth at bedtime as needed for mild constipation.   Yes [provider]  montelukast (SINGULAIR) 10 MG tablet Take 10 mg by mouth daily.   Yes [provider]  psyllium (METAMUCIL SMOOTH TEXTURE) 58.6 % powder Take 1 packet by mouth daily. 01/31/21  Yes Rehman, Mechele Dawley, MD  sertraline (ZOLOFT) 100 MG tablet Take 100 mg by mouth daily as needed (anxiety). 12/04/19  Yes [provider]  clotrimazole-betamethasone (LOTRISONE) cream Apply 1 Application topically daily. 05/17/21   [provider]  Elastic Bandages & Supports ([place]) Wright 1 each by Does not apply route as directed. 1 pair of low pressure below the knee compression stockings Dx: mild orthostatic hypotension 04/21/19   Satira Sark, MD    Inpatient Medications: Scheduled Meds:  clonazePAM  0.5 mg Oral BID   diltiazem  240 mg Oral Daily   fluticasone furoate-vilanterol  1 puff Inhalation Daily   methocarbamol  750 mg Oral TID   montelukast  10 mg Oral QHS   polyethylene glycol  17 g Oral Daily   senna-docusate  2 tablet Oral QHS   sertraline  100 mg Oral Daily   Continuous Infusions:  PRN Meds: fentaNYL (SUBLIMAZE) injection, ondansetron **OR** ondansetron (ZOFRAN) IV, oxyCODONE  Allergies:   No Known Allergies  Social History:   Social History   Socioeconomic History   Marital status: Single    Spouse name: Not on file   Number of children: Not on file   Years of education: Not on file   Highest education level: Not on file  Occupational History   Not on file  Tobacco Use   Smoking status: Former    Packs/day: 0.75    Years: 3.00    Total pack years: 2.25    Types: Cigarettes    Quit date: 03/06/1991    Years since quitting: 30.5   Smokeless tobacco: Never  Vaping Use   Vaping Use: Never used  Substance and Sexual Activity   Alcohol use: No   Drug use: No   Sexual  activity: Not on file  Other Topics Concern   Not on file  Social History Narrative   Not on file   Social Determinants of Health   Financial Resource Strain: Not on file  Food Insecurity: Not on file  Transportation Needs: Not on file  Physical Activity: Not on file  Stress: Not on file  Social Connections: Not on file  Intimate Partner Violence: Not on file    Family History:     Family History  Problem Relation Age of Onset  Hypertension Mother    Heart disease Mother    Heart disease Maternal Aunt    Heart disease Maternal Uncle    Heart disease Maternal Grandmother    Heart disease Maternal Grandfather    Colon cancer Brother    Breast cancer Sister    Lung cancer Sister    Lung disease Sister      ROS:  Please see the history of present illness.  Review of Systems  Constitutional: Positive for malaise/fatigue.  HENT: Negative.    Cardiovascular:  Positive for dyspnea on exertion.  Respiratory: Negative.    Endocrine: Negative.   Hematologic/Lymphatic: Negative.   Musculoskeletal:  Positive for arthritis, joint pain and stiffness.  Gastrointestinal: Negative.   Genitourinary: Negative.   Neurological:  Positive for dizziness, loss of balance and weakness.  Psychiatric/Behavioral:  The patient is nervous/anxious.     All other ROS reviewed and negative.     Physical Exam/Data:   Vitals:   10/01/21 0726 10/01/21 1233 10/01/21 2057 10/02/21 0500  BP:  126/81 123/76 134/78  Pulse:  (!) 107 (!) 104 97  Resp:  '19 18 20  '$ Temp:  98.4 F (36.9 C) 99.1 F (37.3 C) 98.5 F (36.9 C)  TempSrc:   Oral Oral  SpO2: 94% 92% 90% 97%  Weight:      Height:        Intake/Output Summary (Last 24 hours) at 10/02/2021 0821 Last data filed at 10/01/2021 2240 Gross per 24 hour  Intake 780 ml  Output --  Net 780 ml      09/30/2021    1:19 PM 08/31/2021   12:37 PM 01/31/2021   12:17 PM  Last 3 Weights  Weight (lbs) 150 lb 156 lb 150 lb 11.2 oz  Weight (kg) 68.04  kg 70.761 kg 68.357 kg     Body mass index is 21.52 kg/m.  General:  Thin, elderly, missing teeth, in no acute distress  HEENT: normal Neck: no JVD Vascular: No carotid bruits; Distal pulses 2+ bilaterally Cardiac:  normal S1, S2; irreg irreg 568/L 2/6 systolic murmur apex Lungs:  clear to auscultation bilaterally, no wheezing, rhonchi or rales  Abd: soft, nontender, no hepatomegaly  Ext: no edema Musculoskeletal:  No deformities, BUE and BLE strength normal and equal Skin: warm and dry  Neuro:  CNs 2-12 intact, no focal abnormalities noted Psych:  anxious  EKG:  The EKG was personally reviewed and demonstrates:  Afib 136/m RBBB Telemetry:  Telemetry was personally reviewed and demonstrates:  Afib 100/m PVC's  Relevant CV Studies: Echocardiogram 08/06/2016 [facility]): Normal LV wall thickness with LVEF 60-65%, normal right ventricular contraction, mild left atrial enlargement, mildly sclerotic aortic valve without stenosis, mild mitral regurgitation, mild tricuspid regurgitation, no pericardial effusion.  Laboratory Data:  High Sensitivity Troponin:  No results for input(s): "TROPONINIHS" in the last 720 hours.   Chemistry Recent Labs  Lab 09/30/21 1327 10/01/21 0538 10/02/21 0634  NA 140 139 141  K 3.9 4.5 4.2  CL 106 106 105  CO2 '26 26 28  '$ GLUCOSE 109* 93 111*  BUN 26* 25* 28*  CREATININE 1.17 1.10 1.13  CALCIUM 9.4 8.6* 9.1  GFRNONAA >60 >60 >60  ANIONGAP '8 7 8    '$ Recent Labs  Lab 10/02/21 0634  ALBUMIN 3.7   Lipids No results for input(s): "CHOL", "TRIG", "HDL", "LABVLDL", "LDLCALC", "CHOLHDL" in the last 168 hours.  Hematology Recent Labs  Lab 09/30/21 1327 10/01/21 0538 10/02/21 0634  WBC 10.8* 7.5 8.5  RBC 4.50 4.08* 4.05*  HGB 14.0 12.9* 12.7*  HCT 42.1 39.5 39.0  MCV 93.6 96.8 96.3  MCH 31.1 31.6 31.4  MCHC 33.3 32.7 32.6  RDW 13.0 13.1 13.0  PLT 219 192 220   Thyroid No results for input(s): "TSH", "FREET4" in the last 168 hours.   BNPNo results for input(s): "BNP", "PROBNP" in the last 168 hours.  DDimer No results for input(s): "DDIMER" in the last 168 hours.   Radiology/Studies:  DG Hip Unilat With Pelvis 2-3 Views Right  Addendum Date: 09/30/2021   ADDENDUM REPORT: 09/30/2021 16:00 ADDENDUM: The patient was sent for an additional lateral view of the right hip. Poor penetration markedly limits evaluation of the additional lateral view. The femoral head is not well visualized on the additional lateral view. No additional fractures are noted. Electronically Signed   By: Dorise Bullion III JacksonD.   On: 09/30/2021 16:00   Result Date: 09/30/2021 CLINICAL DATA:  Hip fracture. EXAM: DG HIP (WITH OR WITHOUT PELVIS) 2-3V RIGHT COMPARISON:  None Available. FINDINGS: There is a subcapital right hip fracture. No dislocation of the femoral head. No pelvic bone fractures identified. Limited views of the left hip are normal. IMPRESSION: Subcapital right hip fracture.  No dislocation. Electronically Signed: By: Dorise Bullion III JacksonD. On: 09/30/2021 13:51   DG Chest 1 View  Result Date: 09/30/2021 CLINICAL DATA:  Fall. Hip fracture. EXAM: CHEST  1 VIEW COMPARISON:  One view chest x-ray 09/02/2019 FINDINGS: Heart size is normal. Atherosclerotic calcifications are present at the aortic arch. No edema or effusion is present. Scarring is noted at the left greater than right lung apex. Degenerative changes are present in both shoulders. IMPRESSION: 1. No acute cardiopulmonary disease. 2. Atherosclerosis. Electronically Signed   By: [place] Morelle JacksonD.   On: 09/30/2021 13:51     Assessment and Plan:   Preop clearance for hemiarthroplasty of right hip after fall/fracture. Patient had dizziness prior to fall. Hard to tell if orthostatic or possibly due to anxiety meds. Also stopped walking 4 months ago because of DOE. History of CAD but details unknown-says he had MI and 4 stents >10 yrs ago at [facility] could be anginal equivalent  Mets < 4.  Ideally he should have a lexiscan myoview prior to clearing for hip surgery but he is in significant pain. Echo ordered. Will discuss with Dr. Harrington Challenger.  Fall/right hip fracture? Secondary to  dizziness with fall. Has been dizzy for awhile and got up from recliner and became dizzy walking across room and fell backwards.ER note says he laidd on floor 12 hrs before he was found.  PAF on diltiazem and eliquis-held since 09/29/21, rate 90-100's  HTN   History of CAD-details not clear, patient says he had an MI and life flight from [facility] to [place] and has 4 stents> 10 yrs ago. Stopped walking 1 block 4 months ago because of DOE, no chest pain.   COPD/asthma-never smoked  Elevated BNP 1581, does not appear to be fluid overloaded. Echo ordered.  Anxiety on zoloft/klonopin/wellbutrin       For questions or updates, please contact [place] Please consult [email or web address] for contact info under    Signed, Ermalinda Barrios, PA-C  10/02/2021 8:21 AM  Patient seen and examined   I agree with findings as noted above by Gerrianne Scale    Pt is an 81 yo with hx of CAD  (stents at [facility], can't  remember when, last entry 1997 from cardiology, no note)  He was last seen in cardiology by Myles Gip in Nov 2022  The pt lives alone   Admits to doing very little   Gets SOB with activity        No CP   Says he had CP prior to stents He had spell taht he can't remember much about    Found himself on floowr  Now with hip Fx  Currently denies pain  no SOB at rest   Neck;   JVP is normal  No bruits Lungs are CTA Cardiac Irreg irreg    Gr III/VI systolic murmur   (early peaking ) base    Abd is supple  No hepatomegaly    Ext are without edema    2+ PT pulses   EKG with atrial fib  Echo just done   LVEF and RVEF are normal    Mild/mod AS   Impression  Preop risk assessment   Pt with Hx of CAD (remote stents) presents with Hip Fx    (fell, cant remember what happened)  Gets SOB with minimal  activity   No CP      Echo with normal LV systolic function   Mild to moderate AS  EKG with afib  Overall  with CAD hx and dyspnea on minimal exertion, patient's risk for cardiac complication is high  Not possible to define closer as he is not active to allow this.        Spell where he was on floor, ? If syncope     IF possible, would recomm a Lexiscan myoview to r/o large area of ischemia Continue telemetry for now  .  Will continue to follow.  Reviewed with anesthesia   Dorris Carnes MD

## 2021-10-02 NOTE — Progress Notes (Signed)
Patient ID: Jackson Martin, male   DOB: 08/03/1940, 81 y.o.   MRN: [id number]  Right femoral neck fracture  Planned surgery for right bipolar replacement canceled  Reason for cancellation: Patient needs further cardiac work-up  Based on patient's history of cardiac disease, recommend transfer to [facility] for further testing and final definitive treatment.

## 2021-10-02 NOTE — Progress Notes (Signed)
PROGRESS NOTE     Jackson Martin, is a 81 y.o. male, DOB - 1941-01-03, WNU:[id number]  Admit date - 09/30/2021   Admitting Physician Truett Mainland, DO  Outpatient Primary MD for the patient is Monico Blitz, MD  LOS - 2  Chief Complaint  Patient presents with   Fall        Brief Narrative:   81 y.o. male with medical history significant of coronary artery disease and history of MI, atrial fibrillation rate controlled on Eliquis, HLD, HTN,  anxiety, history of prostate cancer admitted on 09/30/2021 with right hip fracture after mechanical fall at home     -Assessment and Plan: 1)Rt hip fracture (subcapital)--- status post mechanical fall -Further management per orthopedic team -Last dose of Eliquis 09/29/2021 PM dose --10/02/21  ---Right hip pain pain control improving -c/n  IV fentanyl prn  -c/n prn oxycodone and methocarbamol for pain control -Orthopedic team requesting cardiology clearance/preop -Echo from 10/02/2021 with EF of 60 to 65%,, no regional wall motion normalities, no LVH, mild to moderate aortic stenosis noted -[facility] to [facility] transfer:  Needs cardiac clearance prior to Hip surgery.  Dr. Harrington Challenger (cardiology) planning stress test at [facility] on 10/03/21.  -- Please consult ortho at [facility] when cleared for surgery.    2)H/o CAD/prior MI and prior stents--- no ACS symptoms -Cardiology preop clearance requested by orthopedic team pending Patient does not appear to be on antiplatelet agent, patient does not appear to be on statins or beta-blockers---??? Why -Stress test on 10/03/2021 at [facility] as above -N.p.o. overnight  3)PAFib--- continue Cardizem for rate control -Eliquis on hold due to right hip fracture -Last dose of Eliquis was PM dose of 09/29/2021  4)COPD--- no acute exacerbation -Continue bronchodilators  5) anxiety disorder--continue Zoloft and Klonopin  Disposition/Need for in-Hospital Stay- patient unable to be discharged at this time due to --right hip fracture  awaiting operative fixation after cardiology preop clearance - going to [facility]-----[facility] will be following----he needs Rt Hip ORIF, but too high risk to do surgery here at [facility]--------hopefully ortho at [facility] can fix his Hip after cardiology clears him pending Stress test 10/03/21 --- Please consult ortho at [facility] when cleared for surgery.   -Anticipate the patient will need SNF rehab  Status is: Inpatient   Disposition: The patient is from: Home              Anticipated d/c is to: SNF              Anticipated d/c date is: 3 days              Patient currently is not medically stable to d/c. Barriers: Not Clinically Stable-   Code Status :  -  Code Status: Full Code   Family Communication:    NA (patient is alert, awake and coherent)   DVT Prophylaxis  :   - SCDs  SCDs Start: 09/30/21 1823   Lab Results  Component Value Date   PLT 220 10/02/2021    Inpatient Medications  Scheduled Meds:  clonazePAM  0.5 mg Oral BID   diltiazem  240 mg Oral Daily   fluticasone furoate-vilanterol  1 puff Inhalation Daily   methocarbamol  750 mg Oral TID   montelukast  10 mg Oral QHS   polyethylene glycol  17 g Oral Daily   regadenoson  0.4 mg Intravenous Once   senna-docusate  2 tablet Oral QHS   sertraline  100 mg Oral Daily   Continuous Infusions:  ceFAZolin     tranexamic acid     PRN Meds:.ceFAZolin, fentaNYL (SUBLIMAZE) injection, ondansetron **OR** ondansetron (ZOFRAN) IV, oxyCODONE, tranexamic acid   Anti-infectives (From admission, onward)    Start     Dose/Rate Route Frequency Ordered Stop   10/02/21 1146  ceFAZolin (ANCEF) 2-4 GM/100ML-% IVPB       Note to Pharmacy: Augustin Schooling R: cabinet override      10/02/21 1146 10/02/21 2359   10/02/21 1145  ceFAZolin (ANCEF) IVPB 2g/100 mL premix  Status:  Discontinued        2 g 200 mL/hr over 30 Minutes Intravenous On call to O.R. 10/02/21 1142 10/02/21 1317         Subjective: Jackson Martin today has no fevers, no emesis,  No  chest pain,   -Right hip pain control improving -No dyspnea on the palpitations no dizziness --[facility] transfer:  Needs cardiac clearance prior to Hip surgery.  Dr. Harrington Challenger (cardiology) planning stress test at [facility] on 10/03/21.  Please consult ortho when cleared for surgery.    Objective: Vitals:   10/01/21 2057 10/02/21 0500 10/02/21 0900 10/02/21 1139  BP: 123/76 134/78 (!) 158/77 128/81  Pulse: (!) 104 97    Resp: '18 20  16  '$ Temp: 99.1 F (37.3 C) 98.5 F (36.9 C)  98.5 F (36.9 C)  TempSrc: Oral Oral  Oral  SpO2: 90% 97%  95%  Weight:      Height:        Intake/Output Summary (Last 24 hours) at 10/02/2021 1329 Last data filed at 10/01/2021 2240 Gross per 24 hour  Intake 420 ml  Output --  Net 420 ml   Filed Weights   09/30/21 1319  Weight: 68 kg    Physical Exam  Gen:- Awake Alert,  in no apparent distress  HEENT:- [place].AT, No sclera icterus Neck-Supple Neck,No JVD,.  Lungs-  CTAB , fair symmetrical air movement CV- S1, S2 normal, regular  Abd-  +ve B.Sounds, Abd Soft, No tenderness,    Extremity/Skin:- No  edema, pedal pulses present  Psych-affect is appropriate, oriented x3 Neuro-no new focal deficits, no tremors MSK--right hip pain, right lower extremity shortened and rotated  Data Reviewed: I have personally reviewed following labs and imaging studies  CBC: Recent Labs  Lab 09/30/21 1327 10/01/21 0538 10/02/21 0634  WBC 10.8* 7.5 8.5  NEUTROABS 9.6*  --   --   HGB 14.0 12.9* 12.7*  HCT 42.1 39.5 39.0  MCV 93.6 96.8 96.3  PLT 219 192 329   Basic Metabolic Panel: Recent Labs  Lab 09/30/21 1327 10/01/21 0538 10/02/21 0634  NA 140 139 141  K 3.9 4.5 4.2  CL 106 106 105  CO2 '26 26 28  '$ GLUCOSE 109* 93 111*  BUN 26* 25* 28*  CREATININE 1.17 1.10 1.13  CALCIUM 9.4 8.6* 9.1  PHOS  --   --  3.0   GFR: Estimated Creatinine Clearance: 48.5 mL/min (by C-G formula based on SCr of 1.13 mg/dL). Liver Function Tests: Recent Labs  Lab 10/02/21 0634   ALBUMIN 3.7   Cardiac Enzymes: Recent Labs  Lab 09/30/21 1327  CKTOTAL 1,581*   BNP (last 3 results) No results for input(s): "PROBNP" in the last 8760 hours. HbA1C: No results for input(s): "HGBA1C" in the last 72 hours. Sepsis Labs: '@LABRCNTIP'$ (procalcitonin:4,lacticidven:4) ) Recent Results (from the past 240 hour(s))  Surgical PCR screen     Status: None   Collection Time: 10/01/21  2:01 PM   Specimen:  Nasal Mucosa; Nasal Swab  Result Value Ref Range Status   MRSA, PCR NEGATIVE NEGATIVE Final   Staphylococcus aureus NEGATIVE NEGATIVE Final    Comment: (NOTE) The Xpert SA Assay (FDA approved for NASAL specimens in patients 55 years of age and older), is one component of a comprehensive surveillance program. It is not intended to diagnose infection nor to guide or monitor treatment. Performed at [facility], [address], [place], [place] [address]     Radiology Studies: ECHOCARDIOGRAM COMPLETE  Result Date: 10/02/2021    ECHOCARDIOGRAM REPORT   Patient Name:   CEJAY CAMBRE Date of Exam: 10/02/2021 Medical Rec #:  [id number]         Height:       70.0 in Accession #:    [id number]        Weight:       150.0 lb Date of Birth:  1940/08/12          BSA:          1.847 m Patient Age:    40 years          BP:           158/77 mmHg Patient Gender: M                 HR:           97 bpm. Exam Location:  [facility] Procedure: 2D Echo, Cardiac Doppler and Color Doppler Indications:    Abnormal ECG R94.31  History:        Patient has prior history of Echocardiogram examinations, most                 recent 08/06/2016. Previous Myocardial Infarction and CAD, COPD,                 Arrythmias:Atrial Fibrillation; Risk Factors:Hypertension and                 Dyslipidemia. Prostate cancer ([place]) (From Hx).  Sonographer:    Alvino Chapel RCS Referring Phys: [phone] Brianca Fortenberry IMPRESSIONS  1. Left ventricular ejection fraction, by estimation, is 60 to 65%. The left ventricle has normal  function. The left ventricle has no regional wall motion abnormalities. Left ventricular diastolic parameters are indeterminate.  2. Right ventricular systolic function is normal. The right ventricular size is mildly enlarged. There is normal pulmonary artery systolic pressure.  3. Left atrial size was moderately dilated.  4. Right atrial size was mildly dilated.  5. The mitral valve is normal in structure. Mild mitral valve regurgitation.  6. AV is thickened, calcified with restrticted motion. Peak and mean gradients through the valve are 27 and 12 mm Hg respectviely AVA (VTI) is 1.43 cm2 . Dimensionless index is 0.39 Overall, all consistent with mild to moderate AS. Marland Kitchen The aortic valve is  tricuspid. Aortic valve regurgitation is not visualized.  7. The inferior vena cava is dilated in size with >50% respiratory variability, suggesting right atrial pressure of 8 mmHg. FINDINGS  Left Ventricle: Left ventricular ejection fraction, by estimation, is 60 to 65%. The left ventricle has normal function. The left ventricle has no regional wall motion abnormalities. The left ventricular internal cavity size was normal in size. There is  no left ventricular hypertrophy. Left ventricular diastolic parameters are indeterminate. Right Ventricle: The right ventricular size is mildly enlarged. Right vetricular wall thickness was not assessed. Right ventricular systolic function is normal. There is normal pulmonary artery systolic pressure. The tricuspid  regurgitant velocity is 2.82 m/s, and with an assumed right atrial pressure of 3 mmHg, the estimated right ventricular systolic pressure is 53.2 mmHg. Left Atrium: Left atrial size was moderately dilated. Right Atrium: Right atrial size was mildly dilated. Pericardium: There is no evidence of pericardial effusion. Mitral Valve: The mitral valve is normal in structure. Mild mitral valve regurgitation. Tricuspid Valve: The tricuspid valve is normal in structure. Tricuspid valve  regurgitation is mild. Aortic Valve: AV is thickened, calcified with restrticted motion. Peak and mean gradients through the valve are 27 and 12 mm Hg respectviely AVA (VTI) is 1.43 cm2 . Dimensionless index is 0.39 Overall, all consistent with mild to moderate AS. The aortic valve is tricuspid. Aortic valve regurgitation is not visualized. Aortic valve mean gradient measures 11.5 mmHg. Aortic valve peak gradient measures 25.6 mmHg. Aortic valve area, by VTI measures 1.23 cm. Pulmonic Valve: The pulmonic valve was grossly normal. Pulmonic valve regurgitation is not visualized. Aorta: The aortic root is normal in size and structure. Venous: The inferior vena cava is dilated in size with greater than 50% respiratory variability, suggesting right atrial pressure of 8 mmHg. IAS/Shunts: No atrial level shunt detected by color flow Doppler.  LEFT VENTRICLE PLAX 2D LVIDd:         4.00 cm LVIDs:         2.60 cm LV PW:         1.00 cm LV IVS:        1.00 cm LVOT diam:     2.00 cm LV SV:         51 LV SV Index:   28 LVOT Area:     3.14 cm  RIGHT VENTRICLE RV S prime:     17.60 cm/s TAPSE (M-mode): 2.0 cm LEFT ATRIUM             Index        RIGHT ATRIUM           Index LA diam:        4.85 cm 2.63 cm/m   RA Area:     21.70 cm LA Vol (A2C):   84.6 ml 45.80 ml/m  RA Volume:   61.50 ml  33.30 ml/m LA Vol (A4C):   81.6 ml 44.18 ml/m LA Biplane Vol: 89.9 ml 48.67 ml/m  AORTIC VALVE AV Area (Vmax):    1.04 cm AV Area (Vmean):   1.12 cm AV Area (VTI):     1.23 cm AV Vmax:           253.00 cm/s AV Vmean:          153.000 cm/s AV VTI:            0.416 m AV Peak Grad:      25.6 mmHg AV Mean Grad:      11.5 mmHg LVOT Vmax:         83.40 cm/s LVOT Vmean:        54.500 cm/s LVOT VTI:          0.163 m LVOT/AV VTI ratio: 0.39  AORTA Ao Root diam: 3.80 cm MITRAL VALVE                TRICUSPID VALVE MV Area (PHT): 3.37 cm     TR Peak grad:   31.8 mmHg MV Decel Time: 225 msec     TR Vmax:        282.00 cm/s MV E velocity: 154.00  cm/s  SHUNTS                             Systemic VTI:  0.16 m                             Systemic Diam: 2.00 cm Dorris Carnes MD Electronically signed by Dorris Carnes MD Signature Date/Time: 10/02/2021/12:04:39 PM    Final    DG Hip Unilat With Pelvis 2-3 Views Right  Addendum Date: 09/30/2021   ADDENDUM REPORT: 09/30/2021 16:00 ADDENDUM: The patient was sent for an additional lateral view of the right hip. Poor penetration markedly limits evaluation of the additional lateral view. The femoral head is not well visualized on the additional lateral view. No additional fractures are noted. Electronically Signed   By: Dorise Bullion III M.D.   On: 09/30/2021 16:00   Result Date: 09/30/2021 CLINICAL DATA:  Hip fracture. EXAM: DG HIP (WITH OR WITHOUT PELVIS) 2-3V RIGHT COMPARISON:  None Available. FINDINGS: There is a subcapital right hip fracture. No dislocation of the femoral head. No pelvic bone fractures identified. Limited views of the left hip are normal. IMPRESSION: Subcapital right hip fracture.  No dislocation. Electronically Signed: By: Dorise Bullion III M.D. On: 09/30/2021 13:51   DG Chest 1 View  Result Date: 09/30/2021 CLINICAL DATA:  Fall. Hip fracture. EXAM: CHEST  1 VIEW COMPARISON:  One view chest x-ray 09/02/2019 FINDINGS: Heart size is normal. Atherosclerotic calcifications are present at the aortic arch. No edema or effusion is present. Scarring is noted at the left greater than right lung apex. Degenerative changes are present in both shoulders. IMPRESSION: 1. No acute cardiopulmonary disease. 2. Atherosclerosis. Electronically Signed   By: San Morelle M.D.   On: 09/30/2021 13:51     Scheduled Meds:  clonazePAM  0.5 mg Oral BID   diltiazem  240 mg Oral Daily   fluticasone furoate-vilanterol  1 puff Inhalation Daily   methocarbamol  750 mg Oral TID   montelukast  10 mg Oral QHS   polyethylene glycol  17 g Oral Daily   regadenoson  0.4 mg  Intravenous Once   senna-docusate  2 tablet Oral QHS   sertraline  100 mg Oral Daily   Continuous Infusions:  ceFAZolin     tranexamic acid       LOS: 2 days    Roxan Hockey M.D on 10/02/2021 at 1:29 PM  Go to [email or web address] - for contact info  [facility] - Office  [phone]  If 7PM-7AM, please contact night-coverage [email or web address] 10/02/2021, 1:29 PM

## 2021-10-03 ENCOUNTER — Inpatient Hospital Stay (HOSPITAL_COMMUNITY): Payer: Medicare HMO

## 2021-10-03 DIAGNOSIS — S72001A Fracture of unspecified part of neck of right femur, initial encounter for closed fracture: Secondary | ICD-10-CM | POA: Diagnosis not present

## 2021-10-03 DIAGNOSIS — R0600 Dyspnea, unspecified: Secondary | ICD-10-CM

## 2021-10-03 DIAGNOSIS — I4811 Longstanding persistent atrial fibrillation: Secondary | ICD-10-CM | POA: Diagnosis not present

## 2021-10-03 DIAGNOSIS — I4891 Unspecified atrial fibrillation: Secondary | ICD-10-CM | POA: Diagnosis not present

## 2021-10-03 LAB — NM MYOCAR MULTI W/SPECT W/WALL MOTION / EF
Estimated workload: 1
Exercise duration (min): 5 min
Exercise duration (sec): 25 s
MPHR: 138 {beats}/min
Peak HR: 121 {beats}/min
Percent HR: 87 %
Rest HR: 104 {beats}/min
ST Depression (mm): 0 mm

## 2021-10-03 LAB — GLUCOSE, CAPILLARY: Glucose-Capillary: 100 mg/dL — ABNORMAL HIGH (ref 70–99)

## 2021-10-03 MED ORDER — TECHNETIUM TC 99M TETROFOSMIN IV KIT
30.2000 | PACK | Freq: Once | INTRAVENOUS | Status: AC | PRN
  Administered 2021-10-03: 30.2 via INTRAVENOUS

## 2021-10-03 MED ORDER — REGADENOSON 0.4 MG/5ML IV SOLN
INTRAVENOUS | Status: AC
  Administered 2021-10-03: 0.4 mg via INTRAVENOUS
  Filled 2021-10-03: qty 5

## 2021-10-03 MED ORDER — ACETAMINOPHEN 650 MG RE SUPP: 650.0000 mg | Freq: Four times a day (QID) | RECTAL | Status: DC | PRN

## 2021-10-03 MED ORDER — ACETAMINOPHEN 325 MG PO TABS
650.0000 mg | ORAL_TABLET | Freq: Four times a day (QID) | ORAL | Status: DC | PRN
  Administered 2021-10-03 – 2021-10-08 (×6): 650 mg via ORAL
  Filled 2021-10-03 (×5): qty 2

## 2021-10-03 MED ORDER — TECHNETIUM TC 99M TETROFOSMIN IV KIT
10.0000 | PACK | Freq: Once | INTRAVENOUS | Status: AC | PRN
  Administered 2021-10-03: 10 via INTRAVENOUS

## 2021-10-03 MED ORDER — REGADENOSON 0.4 MG/5ML IV SOLN
0.4000 mg | Freq: Once | INTRAVENOUS | Status: AC
  Filled 2021-10-03: qty 5

## 2021-10-03 MED ORDER — DOCUSATE SODIUM 100 MG PO CAPS
100.0000 mg | ORAL_CAPSULE | Freq: Two times a day (BID) | ORAL | Status: DC
  Administered 2021-10-03 – 2021-10-06 (×6): 100 mg via ORAL
  Filled 2021-10-03 (×6): qty 1

## 2021-10-03 NOTE — Plan of Care (Deleted)
Jackson Martin underwent nuclear SPECT for cardiac risk stratification. Suspect gut artifact with normal EF and no wall motion abn. The study is low risk. He is acceptable cardiac risk for right hip bipolar replacement.

## 2021-10-03 NOTE — Consult Note (Signed)
Reason for Consult:Right hip fx Referring Physician: Berle Mull Time called: [id number] Time at bedside: Jackson Martin is an 81 y.o. male.  HPI: [place] fell at home a couple of days ago. He had immediate right hip pain and could not get up. He was discovered by family about 12h later and brought to [facility]. X-rays showed a right hip fx and surgery was recommended but pt needs specialized cardiac clearance so he was transferred to [facility] to receive that. The pt is somewhat confused this morning and doesn't remember the fall. He lives at home alone and does not use any assistive devices.  Past Medical History:  Diagnosis Date   Anxiety    Asthmatic bronchitis    Atrial fibrillation (HCC)    Backache    Colon polyps    COPD (chronic obstructive pulmonary disease) ([place])    Diverticulitis 12/2019   Esophageal reflux    Essential hypertension    Gout    Hemorrhoid    Hyperlipidemia    Insomnia    Ischemic heart disease    MI (myocardial infarction) ([place]) 2003   Neck pain    Plantar fasciitis    Prostate cancer [facility]     Past Surgical History:  Procedure Laterality Date   COLONOSCOPY     COLONOSCOPY N/A 11/15/2016   Procedure: COLONOSCOPY;  Surgeon: Rogene Houston, MD;  Location[facility];  Service: Endoscopy;  Laterality: N/A;  1200   COLONOSCOPY WITH PROPOFOL N/A 01/20/2020   Procedure: COLONOSCOPY WITH PROPOFOL;  Surgeon: Rogene Houston, MD;  Location: [facility];  Service: Endoscopy;  Laterality: N/A;  1:15, pt refused to change time   CORONARY ANGIOPLASTY WITH STENT PLACEMENT  2007   4 stents placed   POLYPECTOMY  11/15/2016   Procedure: POLYPECTOMY;  Surgeon: Rogene Houston, MD;  Location: [facility];  Service: Endoscopy;;  colon   POLYPECTOMY  01/20/2020   Procedure: POLYPECTOMY;  Surgeon: Rogene Houston, MD;  [facility];  Service: Endoscopy;;   PROSTATECTOMY  2001    Family History  Problem Relation Age of Onset   Hypertension Mother     Heart disease Mother    Heart disease Maternal Aunt    Heart disease Maternal Uncle    Heart disease Maternal Grandmother    Heart disease Maternal Grandfather    Colon cancer Brother    Breast cancer Sister    Lung cancer Sister    Lung disease Sister     Social History:  reports that he quit smoking about 30 years ago. His smoking use included cigarettes. He has a 2.25 pack-year smoking history. He has never used smokeless tobacco. He reports that he does not drink alcohol and does not use drugs.  Allergies: No Known Allergies  Medications: I have reviewed the patient's current medications.  Results for orders placed or performed during the hospital encounter of 09/30/21 (from the past 48 hour(s))  Surgical PCR screen     Status: None   Collection Time: 10/01/21  2:01 PM   Specimen: Nasal Mucosa; Nasal Swab  Result Value Ref Range   MRSA, PCR NEGATIVE NEGATIVE   Staphylococcus aureus NEGATIVE NEGATIVE    Comment: (NOTE) The Xpert SA Assay (FDA approved for NASAL specimens in patients 42 years of age and older), is one component of a comprehensive surveillance program. It is not intended to diagnose infection nor to guide or monitor treatment. Performed at [facility], [place]  [address] [place], [place] [address]   CBC     Status: Abnormal   Collection Time: 10/02/21  6:34 AM  Result Value Ref Range   WBC 8.5 4.0 - 10.5 K/uL   RBC 4.05 (L) 4.22 - 5.81 MIL/uL   Hemoglobin 12.7 (L) 13.0 - 17.0 g/dL   HCT 39.0 39.0 - 52.0 %   MCV 96.3 80.0 - 100.0 fL   MCH 31.4 26.0 - 34.0 pg   MCHC 32.6 30.0 - 36.0 g/dL   RDW 13.0 11.5 - 15.5 %   Platelets 220 150 - 400 K/uL   nRBC 0.0 0.0 - 0.2 %    Comment: Performed at [facility], [address] [place], [place] [address]  Renal function panel     Status: Abnormal   Collection Time: 10/02/21  6:34 AM  Result Value Ref Range   Sodium 141 135 - 145 mmol/L   Potassium 4.2 3.5 - 5.1 mmol/L   Chloride 105 98 - 111 mmol/L   CO2 28 22 -  32 mmol/L   Glucose, Bld 111 (H) 70 - 99 mg/dL    Comment: Glucose reference range applies only to samples taken after fasting for at least 8 hours.   BUN 28 (H) 8 - 23 mg/dL   Creatinine, Ser 1.13 0.61 - 1.24 mg/dL   Calcium 9.1 8.9 - 10.3 mg/dL   Phosphorus 3.0 2.5 - 4.6 mg/dL   Albumin 3.7 3.5 - 5.0 g/dL   GFR, Estimated >60 >60 mL/min    Comment: (NOTE) Calculated using the CKD-EPI Creatinine Equation (2021)    Anion gap 8 5 - 15    Comment: Performed at [facility], [address] [place], [place] [address]  Glucose, capillary     Status: Abnormal   Collection Time: 10/02/21  1:29 PM  Result Value Ref Range   Glucose-Capillary 126 (H) 70 - 99 mg/dL    Comment: Glucose reference range applies only to samples taken after fasting for at least 8 hours.  Glucose, capillary     Status: Abnormal   Collection Time: 10/02/21  5:14 PM  Result Value Ref Range   Glucose-Capillary 127 (H) 70 - 99 mg/dL    Comment: Glucose reference range applies only to samples taken after fasting for at least 8 hours.  Glucose, capillary     Status: Abnormal   Collection Time: 10/03/21 12:11 AM  Result Value Ref Range   Glucose-Capillary 100 (H) 70 - 99 mg/dL    Comment: Glucose reference range applies only to samples taken after fasting for at least 8 hours.    ECHOCARDIOGRAM COMPLETE  Result Date: 10/02/2021    ECHOCARDIOGRAM REPORT   Patient Name:   Jackson Martin Date of Exam: 10/02/2021 Medical Rec #:  [id number]         Height:       70.0 in Accession #:    [id number]        Weight:       150.0 lb Date of Birth:  12-02-1940          BSA:          1.847 m Patient Age:    68 years          BP:           158/77 mmHg Patient Gender: M                 HR:           97 bpm.  Exam Location:  [facility] Procedure: 2D Echo, Cardiac Doppler and Color Doppler Indications:    Abnormal ECG R94.31  History:        Patient has prior history of Echocardiogram examinations, most                 recent 08/06/2016.  Previous Myocardial Infarction and CAD, COPD,                 Arrythmias:Atrial Fibrillation; Risk Factors:Hypertension and                 Dyslipidemia. Prostate cancer ([place]) (From Hx).  Sonographer:    Alvino Chapel RCS Referring Phys: [phone] COURAGE EMOKPAE IMPRESSIONS  1. Left ventricular ejection fraction, by estimation, is 60 to 65%. The left ventricle has normal function. The left ventricle has no regional wall motion abnormalities. Left ventricular diastolic parameters are indeterminate.  2. Right ventricular systolic function is normal. The right ventricular size is mildly enlarged. There is normal pulmonary artery systolic pressure.  3. Left atrial size was moderately dilated.  4. Right atrial size was mildly dilated.  5. The mitral valve is normal in structure. Mild mitral valve regurgitation.  6. AV is thickened, calcified with restrticted motion. Peak and mean gradients through the valve are 27 and 12 mm Hg respectviely AVA (VTI) is 1.43 cm2 . Dimensionless index is 0.39 Overall, all consistent with mild to moderate AS. Marland Kitchen The aortic valve is  tricuspid. Aortic valve regurgitation is not visualized.  7. The inferior vena cava is dilated in size with >50% respiratory variability, suggesting right atrial pressure of 8 mmHg. FINDINGS  Left Ventricle: Left ventricular ejection fraction, by estimation, is 60 to 65%. The left ventricle has normal function. The left ventricle has no regional wall motion abnormalities. The left ventricular internal cavity size was normal in size. There is  no left ventricular hypertrophy. Left ventricular diastolic parameters are indeterminate. Right Ventricle: The right ventricular size is mildly enlarged. Right vetricular wall thickness was not assessed. Right ventricular systolic function is normal. There is normal pulmonary artery systolic pressure. The tricuspid regurgitant velocity is 2.82 m/s, and with an assumed right atrial pressure of 3 mmHg, the estimated right  ventricular systolic pressure is 71.6 mmHg. Left Atrium: Left atrial size was moderately dilated. Right Atrium: Right atrial size was mildly dilated. Pericardium: There is no evidence of pericardial effusion. Mitral Valve: The mitral valve is normal in structure. Mild mitral valve regurgitation. Tricuspid Valve: The tricuspid valve is normal in structure. Tricuspid valve regurgitation is mild. Aortic Valve: AV is thickened, calcified with restrticted motion. Peak and mean gradients through the valve are 27 and 12 mm Hg respectviely AVA (VTI) is 1.43 cm2 . Dimensionless index is 0.39 Overall, all consistent with mild to moderate AS. The aortic valve is tricuspid. Aortic valve regurgitation is not visualized. Aortic valve mean gradient measures 11.5 mmHg. Aortic valve peak gradient measures 25.6 mmHg. Aortic valve area, by VTI measures 1.23 cm. Pulmonic Valve: The pulmonic valve was grossly normal. Pulmonic valve regurgitation is not visualized. Aorta: The aortic root is normal in size and structure. Venous: The inferior vena cava is dilated in size with greater than 50% respiratory variability, suggesting right atrial pressure of 8 mmHg. IAS/Shunts: No atrial level shunt detected by color flow Doppler.  LEFT VENTRICLE PLAX 2D LVIDd:         4.00 cm LVIDs:         2.60 cm LV PW:  1.00 cm LV IVS:        1.00 cm LVOT diam:     2.00 cm LV SV:         51 LV SV Index:   28 LVOT Area:     3.14 cm  RIGHT VENTRICLE RV S prime:     17.60 cm/s TAPSE (M-mode): 2.0 cm LEFT ATRIUM             Index        RIGHT ATRIUM           Index LA diam:        4.85 cm 2.63 cm/m   RA Area:     21.70 cm LA Vol (A2C):   84.6 ml 45.80 ml/m  RA Volume:   61.50 ml  33.30 ml/m LA Vol (A4C):   81.6 ml 44.18 ml/m LA Biplane Vol: 89.9 ml 48.67 ml/m  AORTIC VALVE AV Area (Vmax):    1.04 cm AV Area (Vmean):   1.12 cm AV Area (VTI):     1.23 cm AV Vmax:           253.00 cm/s AV Vmean:          153.000 cm/s AV VTI:            0.416 m AV  Peak Grad:      25.6 mmHg AV Mean Grad:      11.5 mmHg LVOT Vmax:         83.40 cm/s LVOT Vmean:        54.500 cm/s LVOT VTI:          0.163 m LVOT/AV VTI ratio: 0.39  AORTA Ao Root diam: 3.80 cm MITRAL VALVE                TRICUSPID VALVE MV Area (PHT): 3.37 cm     TR Peak grad:   31.8 mmHg MV Decel Time: 225 msec     TR Vmax:        282.00 cm/s MV E velocity: 154.00 cm/s                             SHUNTS                             Systemic VTI:  0.16 m                             Systemic Diam: 2.00 cm Dorris Carnes MD Electronically signed by Dorris Carnes MD Signature Date/Time: 10/02/2021/12:04:39 PM    Final     Review of Systems  Unable to perform ROS: Mental status change   Blood pressure 135/82, pulse 100, temperature 98.4 F (36.9 C), resp. rate 18, height '5\' 10"'$  (1.778 m), weight 68 kg, SpO2 94 %. Physical Exam Constitutional:      General: He is not in acute distress.    Appearance: He is well-developed. He is not diaphoretic.  HENT:     Head: Normocephalic and atraumatic.  Eyes:     General: No scleral icterus.       Right eye: No discharge.        Left eye: No discharge.     Conjunctiva/sclera: Conjunctivae normal.  Cardiovascular:     Rate and Rhythm: Normal rate and regular rhythm.  Pulmonary:     Effort: Pulmonary  effort is normal. No respiratory distress.  Musculoskeletal:     Cervical back: Normal range of motion.     Comments: RLE No traumatic wounds, ecchymosis, or rash  Mod TTP hip  No knee or ankle effusion  Knee stable to varus/ valgus and anterior/posterior stress  Sens DPN, SPN, TN intact  Motor EHL, ext, flex, evers 5/5  DP 1+, PT 1+, No significant edema  Skin:    General: Skin is warm and dry.  Neurological:     Mental Status: He is alert.  Psychiatric:        Mood and Affect: Mood normal.        Behavior: Behavior normal.     Assessment/Plan: Right hip fx -- Will need THA when cleared by cardiology. Surgeon and timing TBD. Ok to give diet  today.    Lisette Abu, PA-C Orthopedic Surgery [phone] 10/03/2021, 11:02 AM

## 2021-10-03 NOTE — Progress Notes (Signed)
   Carla Drape presented for a nuclear stress test today.  No immediate complications.  Stress imaging is pending at this time.  Preliminary EKG findings may be listed in the chart, but the stress test result will not be finalized until perfusion imaging is complete.  1 day study, [place] to read.  Rosaria Ferries, PA-C 10/03/2021, 1:50 PM

## 2021-10-03 NOTE — Hospital Course (Signed)
Past medical history of CAD, paroxysmal A-fib, on Eliquis, HLD, HTN, anxiety, prostate cancer.  Presented to [facility] with a mechanical fall and found to have right subcapital hip fracture. Orthopedic was consulted who recommended cardiology evaluation.  Patient was recommended to have a stress test for which patient was transferred to [facility]. Currently stress test is negative for reversible ischemia. Orthopedic consulted for surgical repair although due to schedule availability, surgery may be delayed to Thursday or Friday.  N.p.o. after midnight for Wednesday in case patient can be placed on schedule.

## 2021-10-03 NOTE — Plan of Care (Signed)

## 2021-10-03 NOTE — Progress Notes (Addendum)
Progress Note  Patient Name: Jackson SHEHADEH Date of Encounter: 10/03/2021  [facility] Cardiologist: Rozann Lesches, MD   Subjective   No chest pain or SOB, wants to have the surgery.  Inpatient Medications    Scheduled Meds:  clonazePAM  0.5 mg Oral BID   diltiazem  240 mg Oral Daily   docusate sodium  100 mg Oral BID   fluticasone furoate-vilanterol  1 puff Inhalation Daily   methocarbamol  750 mg Oral TID   montelukast  10 mg Oral QHS   polyethylene glycol  17 g Oral Daily   regadenoson       regadenoson  0.4 mg Intravenous Once   senna-docusate  2 tablet Oral QHS   sertraline  100 mg Oral Daily   Continuous Infusions:  PRN Meds: acetaminophen **OR** acetaminophen, fentaNYL (SUBLIMAZE) injection, ondansetron **OR** ondansetron (ZOFRAN) IV, oxyCODONE, regadenoson   Vital Signs    Vitals:   10/03/21 0118 10/03/21 0518 10/03/21 0710 10/03/21 0843  BP: (!) 143/83 (!) 148/88  135/82  Pulse: 92 (!) 105  100  Resp: '16 15  18  '$ Temp: 98.6 F (37 C)   98.4 F (36.9 C)  TempSrc: Oral     SpO2: 93% 93% 95% 94%  Weight:      Height:        Intake/Output Summary (Last 24 hours) at 10/03/2021 1301 Last data filed at 10/03/2021 0604 Gross per 24 hour  Intake 601.96 ml  Output 450 ml  Net 151.96 ml      09/30/2021    1:19 PM 08/31/2021   12:37 PM 01/31/2021   12:17 PM  Last 3 Weights  Weight (lbs) 150 lb 156 lb 150 lb 11.2 oz  Weight (kg) 68.04 kg 70.761 kg 68.357 kg      Telemetry    Atrial fib, occ PVCs - Personally Reviewed  ECG    Atrial fib, HR 105, ?atyp RBBB w/ QRS 122 ms - Personally Reviewed  Physical Exam   GEN: No acute distress.   Neck: No JVD Cardiac: Irreg R&R, 2/6 murmur, no rubs, or gallops.  Respiratory: Clear to auscultation bilaterally. GI: Soft, nontender, non-distended  MS: No edema; + deformity R hip. Neuro:  Nonfocal  Psych: Normal affect   Labs    High Sensitivity Troponin:  No results for input(s): "TROPONINIHS" in the  last 720 hours.   Chemistry Recent Labs  Lab 09/30/21 1327 10/01/21 0538 10/02/21 0634  NA 140 139 141  K 3.9 4.5 4.2  CL 106 106 105  CO2 '26 26 28  '$ GLUCOSE 109* 93 111*  BUN 26* 25* 28*  CREATININE 1.17 1.10 1.13  CALCIUM 9.4 8.6* 9.1  ALBUMIN  --   --  3.7  GFRNONAA >60 >60 >60  ANIONGAP '8 7 8    '$ Lipids No results for input(s): "CHOL", "TRIG", "HDL", "LABVLDL", "LDLCALC", "CHOLHDL" in the last 168 hours.  Hematology Recent Labs  Lab 09/30/21 1327 10/01/21 0538 10/02/21 0634  WBC 10.8* 7.5 8.5  RBC 4.50 4.08* 4.05*  HGB 14.0 12.9* 12.7*  HCT 42.1 39.5 39.0  MCV 93.6 96.8 96.3  MCH 31.1 31.6 31.4  MCHC 33.3 32.7 32.6  RDW 13.0 13.1 13.0  PLT 219 192 220   Thyroid No results for input(s): "TSH", "FREET4" in the last 168 hours.  BNPNo results for input(s): "BNP", "PROBNP" in the last 168 hours.  DDimer No results for input(s): "DDIMER" in the last 168 hours.   Radiology  ECHOCARDIOGRAM COMPLETE  Result Date: 10/02/2021    ECHOCARDIOGRAM REPORT   Patient Name:   Jackson Martin Date of Exam: 10/02/2021 Medical Rec #:  [id number]         Height:       70.0 in Accession #:    [id number]        Weight:       150.0 lb Date of Birth:  1940/12/25          BSA:          1.847 m Patient Age:    35 years          BP:           158/77 mmHg Patient Gender: M                 HR:           97 bpm. [facility]am Location[facility]  [facility] Procedure: 2D Echo, Cardiac Doppler and Color Doppler Indications:    Abnormal ECG R94.31  History:        Patient has prior history of Echocardiogram examinations, most                 recent 08/06/2016. Previous Myocardial Infarction and CAD, COPD,                 Arrythmias:Atrial Fibrillation; Risk Factors:Hypertension and                 Dyslipidemia. Prostate cancer ([place]) (From Hx).  Sonographer:    Alvino Chapel RCS Referring Phys: [phone] COURAGE EMOKPAE IMPRESSIONS  1. Left ventricular ejection fraction, by estimation, is 60 to 65%. The left ventricle has normal  function. The left ventricle has no regional wall motion abnormalities. Left ventricular diastolic parameters are indeterminate.  2. Right ventricular systolic function is normal. The right ventricular size is mildly enlarged. There is normal pulmonary artery systolic pressure.  3. Left atrial size was moderately dilated.  4. Right atrial size was mildly dilated.  5. The mitral valve is normal in structure. Mild mitral valve regurgitation.  6. AV is thickened, calcified with restrticted motion. Peak and mean gradients through the valve are 27 and 12 mm Hg respectviely AVA (VTI) is 1.43 cm2 . Dimensionless index is 0.39 Overall, all consistent with mild to moderate AS. Marland Kitchen The aortic valve is  tricuspid. Aortic valve regurgitation is not visualized.  7. The inferior vena cava is dilated in size with >50% respiratory variability, suggesting right atrial pressure of 8 mmHg. FINDINGS  Left Ventricle: Left ventricular ejection fraction, by estimation, is 60 to 65%. The left ventricle has normal function. The left ventricle has no regional wall motion abnormalities. The left ventricular internal cavity size was normal in size. There is  no left ventricular hypertrophy. Left ventricular diastolic parameters are indeterminate. Right Ventricle: The right ventricular size is mildly enlarged. Right vetricular wall thickness was not assessed. Right ventricular systolic function is normal. There is normal pulmonary artery systolic pressure. The tricuspid regurgitant velocity is 2.82 m/s, and with an assumed right atrial pressure of 3 mmHg, the estimated right ventricular systolic pressure is 74.1 mmHg. Left Atrium: Left atrial size was moderately dilated. Right Atrium: Right atrial size was mildly dilated. Pericardium: There is no evidence of pericardial effusion. Mitral Valve: The mitral valve is normal in structure. Mild mitral valve regurgitation. Tricuspid Valve: The tricuspid valve is normal in structure. Tricuspid valve  regurgitation is mild. Aortic Valve: AV is thickened, calcified with restrticted  motion. Peak and mean gradients through the valve are 27 and 12 mm Hg respectviely AVA (VTI) is 1.43 cm2 . Dimensionless index is 0.39 Overall, all consistent with mild to moderate AS. The aortic valve is tricuspid. Aortic valve regurgitation is not visualized. Aortic valve mean gradient measures 11.5 mmHg. Aortic valve peak gradient measures 25.6 mmHg. Aortic valve area, by VTI measures 1.23 cm. Pulmonic Valve: The pulmonic valve was grossly normal. Pulmonic valve regurgitation is not visualized. Aorta: The aortic root is normal in size and structure. Venous: The inferior vena cava is dilated in size with greater than 50% respiratory variability, suggesting right atrial pressure of 8 mmHg. IAS/Shunts: No atrial level shunt detected by color flow Doppler.  LEFT VENTRICLE PLAX 2D LVIDd:         4.00 cm LVIDs:         2.60 cm LV PW:         1.00 cm LV IVS:        1.00 cm LVOT diam:     2.00 cm LV SV:         51 LV SV Index:   28 LVOT Area:     3.14 cm  RIGHT VENTRICLE RV S prime:     17.60 cm/s TAPSE (M-mode): 2.0 cm LEFT ATRIUM             Index        RIGHT ATRIUM           Index LA diam:        4.85 cm 2.63 cm/m   RA Area:     21.70 cm LA Vol (A2C):   84.6 ml 45.80 ml/m  RA Volume:   61.50 ml  33.30 ml/m LA Vol (A4C):   81.6 ml 44.18 ml/m LA Biplane Vol: 89.9 ml 48.67 ml/m  AORTIC VALVE AV Area (Vmax):    1.04 cm AV Area (Vmean):   1.12 cm AV Area (VTI):     1.23 cm AV Vmax:           253.00 cm/s AV Vmean:          153.000 cm/s AV VTI:            0.416 m AV Peak Grad:      25.6 mmHg AV Mean Grad:      11.5 mmHg LVOT Vmax:         83.40 cm/s LVOT Vmean:        54.500 cm/s LVOT VTI:          0.163 m LVOT/AV VTI ratio: 0.39  AORTA Ao Root diam: 3.80 cm MITRAL VALVE                TRICUSPID VALVE MV Area (PHT): 3.37 cm     TR Peak grad:   31.8 mmHg MV Decel Time: 225 msec     TR Vmax:        282.00 cm/s MV E velocity: 154.00  cm/s                             SHUNTS                             Systemic VTI:  0.16 m  Systemic Diam: 2.00 cm Dorris Carnes MD Electronically signed by Dorris Carnes MD Signature Date/Time: 10/02/2021/12:04:39 PM    Final     Cardiac Studies   MYOVIEW: pending  ECHO: 10/02/2021  1. Left ventricular ejection fraction, by estimation, is 60 to 65%. The  left ventricle has normal function. The left ventricle has no regional  wall motion abnormalities. Left ventricular diastolic parameters are  indeterminate.   2. Right ventricular systolic function is normal. The right ventricular  size is mildly enlarged. There is normal pulmonary artery systolic  pressure.   3. Left atrial size was moderately dilated.   4. Right atrial size was mildly dilated.   5. The mitral valve is normal in structure. Mild mitral valve  regurgitation.   6. AV is thickened, calcified with restrticted motion. Peak and mean  gradients through the valve are 27 and 12 mm Hg respectviely AVA (VTI) is 1.43 cm2 . Dimensionless index is 0.39 Overall, all consistent with mild  to moderate AS. Marland Kitchen The aortic valve is tricuspid. Aortic valve regurgitation is not visualized.   7. The inferior vena cava is dilated in size with >50% respiratory  variability, suggesting right atrial pressure of 8 mmHg.   Patient Profile     81 y.o. male with a hx of Afib on Eliquis, HTN, HLD, anxiety, COPD, MI no further details available), gout, who is being seen 10/02/2021 for preop clearance for hip arthroplasty after fall/fracture.   Assessment & Plan    Preop cardiovascular evaluation - poor baseline functional status -  In Care Everywhere, there is an indication that he was admitted on [place] 09/1995, no other information available. He thinks that was when he had his MI and got 4 stents. - for MV today.   2. Afib on Eliquis - HR was elevated when he came in, has improved w/ metoprolol - pta on Eliquis, this is on  hold.  - no hx CVA, is on subcu heparin 5000 u tid. - pta was on Cardizem CD 240 mg qd, got that this am, SBP 105 after that so no med titration - resume Eliquis when ok w/ Surgery  3. R hip fx - Surgery saw today, time of THA tbd, per their note, ok to eat today - will resume diet after MV  4. Mild-mod AS - AoV was sclerotic in 2018, now w/ mild-mod stenosis - no hx syncope, follow  Otherwise, per IM/Surgery   For questions or updates, please contact [place] Please consult [email or web address] for contact info under        Signed, Rosaria Ferries, PA-C  10/03/2021, 1:01 PM

## 2021-10-03 NOTE — Progress Notes (Signed)
  Progress Note Patient: Jackson Martin VOP:[id number] DOB: 1940-10-19 DOA: 09/30/2021  DOS: the patient was seen and examined on 10/03/2021  Brief hospital course: Past medical history of CAD, paroxysmal A-fib, on Eliquis, HLD, HTN, anxiety, prostate cancer.  Presented to [facility] with a mechanical fall and found to have right subcapital hip fracture. Orthopedic was consulted who recommended cardiology evaluation.  Patient was recommended to have a stress test for which patient was transferred to [facility]. Currently stress test is negative for reversible ischemia. Orthopedic consulted for surgical repair although due to schedule availability, surgery may be delayed to Thursday or Friday.  N.p.o. after midnight for Wednesday in case patient can be placed on schedule.   Assessment and Plan: Right hip subcapital fracture. Mechanical fall. Last dose of Eliquis was 7/28. Presented to [facility] was consulted they are also requesting cardiology clearance. Currently orthopedic at [facility] consulted. We will keep n.p.o. after midnight for Wednesday although most likely patient will get a surgery on Thursday or Friday. Continue to hold Eliquis. Stress test was negative for any reversible ischemia.  CAD Does not have any chest pain. Cardiology consulted. Currently on no medication. Echocardiogram shows preserved EF. Patient was sent to [facility] for stress test. Stress test was negative for any reversible ischemia.  Paroxysmal A-fib. On Cardizem which continue. Eliquis on hold. Last dose 7/28.  COPD. Cardiac Patient ordered Monitor.  Anxiety. Continue Zoloft and Klonopin.  Subjective: Pain well controlled.  No nausea no vomiting no fever no chills no current chest pain.  Physical Exam: Vitals:   10/03/21 1314 10/03/21 1315 10/03/21 1317 10/03/21 1608  BP:  105/64 110/71 (!) 141/84  Pulse: (!) 122 (!) 116 (!) 115 100  Resp:     18  Temp:    97.9 F (36.6 C)  TempSrc:      SpO2:    96%  Weight:      Height:       General: Appear in mild distress; no visible Abnormal Neck Mass Or lumps, Conjunctiva normal Cardiovascular: S1 and S2 Present, no Murmur, Respiratory: good respiratory effort, Bilateral Air entry present and CTA, no Crackles, no wheezes Abdomen: Bowel Sound present, Non tender  Extremities: no Pedal edema Neurology: alert and oriented to time, place, and person  Gait not checked due to patient safety concerns   Data Reviewed: I have Reviewed nursing notes, Vitals, and Lab results since pt's last encounter. Pertinent lab results CBC and BMP I have ordered test including CBC and BMP    Family Communication: None at bedside  Disposition: Status is: Inpatient Remains inpatient appropriate because: Hip fracture requiring surgery  Author: Berle Mull, MD 10/03/2021 8:31 PM  Please look on [email or web address] to find out who is on call.

## 2021-10-03 NOTE — Care Management Important Message (Signed)
Important Message  Patient Details  Name: Jackson Martin MRN: [id number] Date of Birth: [birth year]   Medicare Important Message Given:  Yes     Hannah Beat 10/03/2021, 11:55 AM

## 2021-10-03 NOTE — Progress Notes (Signed)
Patient ID: Jackson Martin, male   DOB: 1940/11/19, 81 y.o.   MRN: [id number] I have seen and examined the patient and agree with Hilbert Odor, PA-C's consultation note.  In summary, the patient has a displaced right hip femoral neck fracture.  He was admitted to [facility] in [place] this past Saturday, July 29.  Apparently he was scheduled for surgery yesterday by orthopedics in [place] and the surgery was canceled due to the need for cardiac clearance.  The patient was then transferred here to [facility] and showed appear today.  I was not made aware that the patient was being transferred to here.  Orthopedics has been consulted to now address this injury.  He has eaten today and is in need of cardiac clearance for surgery can be scheduled.  Unfortunately I am in the office all day tomorrow and none of my colleagues can schedule him at this hospital tomorrow due to a packed OR schedule.  I can potentially schedule his surgery for after 5 PM tomorrow but a lot of of that will depend on how call goes tonight.  Unfortunately the situation is what it is.  I will have to check about the potential for surgery either Thursday or Friday of this week unless one of my colleagues in town can address this injury sooner.

## 2021-10-04 DIAGNOSIS — F419 Anxiety disorder, unspecified: Secondary | ICD-10-CM | POA: Diagnosis not present

## 2021-10-04 DIAGNOSIS — I4891 Unspecified atrial fibrillation: Secondary | ICD-10-CM | POA: Diagnosis not present

## 2021-10-04 DIAGNOSIS — J449 Chronic obstructive pulmonary disease, unspecified: Secondary | ICD-10-CM | POA: Diagnosis not present

## 2021-10-04 DIAGNOSIS — S72001A Fracture of unspecified part of neck of right femur, initial encounter for closed fracture: Secondary | ICD-10-CM | POA: Diagnosis not present

## 2021-10-04 LAB — GLUCOSE, CAPILLARY: Glucose-Capillary: 103 mg/dL — ABNORMAL HIGH (ref 70–99)

## 2021-10-04 NOTE — Progress Notes (Signed)
Patient ID: Jackson Martin, male   DOB: 05-15-1940, 81 y.o.   MRN: [id number] There has been no acute changes in the patient's medical status.  I did review the notes from the hospitalist who is graciously taking care of this patient.  Apparently his cardiac stress test was negative.  From an orthopedic standpoint, there is no availability here at [facility] for surgery on the patient's right hip today.  I am requesting that the patient be transferred to [facility] where there is more OR availability over the next few days in order to get the patient on the OR schedule for addressing his right hip fracture.

## 2021-10-04 NOTE — Progress Notes (Addendum)
PROGRESS NOTE    Jackson Martin  WJX:[id number] DOB: 02/15/1941 DOA: 09/30/2021 PCP: Monico Blitz, MD   Brief Narrative: Jackson Martin is a 81 y.o. male with a history of CAD, MI, atrial fibrillation on Eliquis, hypertension, hyperlipidemia, anxiety, prostate cancer. Patient presented secondary to a fall and found to have a right hip fracture. Patient was transferred from [facility] for cardiology evaluation for perioperative risk assessment. Orthopedic surgery consulted for surgical fixation.  Assessment and Plan:  Right hip fracture Secondary to fall. Orthopedic surgery consulted with plan for surgical fixation. Unknown timing of surgery at this time.  CAD No chest pain currently. Cardiology consulted for perioperative risk assessment and recommended nuclear stress test. Nuclear stress test with non-reversible ischemia.  Paroxysmal atrial fibrillation Patient is managed on Eliquis and diltiazem. Eliquis held on admission. -Continue diltiazem.   COPD -Continue Breo Ellipta and Singulair  Anxiety -Continue home Zoloft and Klonopin  Pressure injury Lower/bilateral perineum. Present on admission.   DVT prophylaxis: SCDs Code Status:   Code Status: Full Code Family Communication: None at bedside Disposition Plan: Discharge pending orthopedic surgery management/recommendations and eventual PT/OT recommendations   Consultants:  Orthopedic surgery Cardiology  Procedures:  Nuclear stress test (8/1)  Antimicrobials: None    Subjective: Patient reports no significant issues overnight. Hoping to get surgery done.  Objective: BP (!) 139/97   Pulse (!) 109   Temp 98 F (36.7 C) (Oral)   Resp 17   Ht '5\' 10"'$  (1.778 m)   Wt 68 kg   SpO2 98%   BMI 21.52 kg/m   Examination:  General exam: Appears calm and comfortable Respiratory system: Clear to auscultation. Respiratory effort normal. Cardiovascular system: S1 & S2 heard, RRR. No murmurs, rubs,  gallops or clicks. Gastrointestinal system: Abdomen is nondistended, soft and nontender. No organomegaly or masses felt. Normal bowel sounds heard. Central nervous system: Alert and oriented. No focal neurological deficits. Musculoskeletal: No edema. No calf tenderness Skin: No cyanosis. No rashes Psychiatry: Judgement and insight appear normal. Mood & affect appropriate.    Data Reviewed: I have personally reviewed following labs and imaging studies  CBC Lab Results  Component Value Date   WBC 8.5 10/02/2021   RBC 4.05 (L) 10/02/2021   HGB 12.7 (L) 10/02/2021   HCT 39.0 10/02/2021   MCV 96.3 10/02/2021   MCH 31.4 10/02/2021   PLT 220 10/02/2021   MCHC 32.6 10/02/2021   RDW 13.0 10/02/2021   LYMPHSABS 0.4 (L) 09/30/2021   MONOABS 0.8 09/30/2021   EOSABS 0.0 09/30/2021   BASOSABS 0.0 21/30/8657     Last metabolic panel Lab Results  Component Value Date   NA 141 10/02/2021   K 4.2 10/02/2021   CL 105 10/02/2021   CO2 28 10/02/2021   BUN 28 (H) 10/02/2021   CREATININE 1.13 10/02/2021   GLUCOSE 111 (H) 10/02/2021   GFRNONAA >60 10/02/2021   GFRAA >60 09/02/2019   CALCIUM 9.1 10/02/2021   PHOS 3.0 10/02/2021   PROT 6.5 02/20/2021   ALBUMIN 3.7 10/02/2021   BILITOT 0.8 02/20/2021   ALKPHOS 87 05/22/2020   AST 17 02/20/2021   ALT 14 02/20/2021   ANIONGAP 8 10/02/2021    GFR: Estimated Creatinine Clearance: 48.5 mL/min (by C-G formula based on SCr of 1.13 mg/dL).  Recent Results (from the past 240 hour(s))  Surgical PCR screen     Status: None   Collection Time: 10/01/21  2:01 PM   Specimen: Nasal Mucosa; Nasal Swab  Result Value Ref Range Status   MRSA, PCR NEGATIVE NEGATIVE Final   Staphylococcus aureus NEGATIVE NEGATIVE Final    Comment: (NOTE) The Xpert SA Assay (FDA approved for NASAL specimens in patients 2 years of age and older), is one component of a comprehensive surveillance program. It is not intended to diagnose infection nor to guide or monitor  treatment. Performed at [facility], [address] [place], [place] [address]       Radiology Studies: NM Myocar Multi W/Spect W/Wall Motion / EF  Result Date: 10/03/2021 CLINICAL DATA:  Dyspnea on exertion EXAM: MYOCARDIAL IMAGING WITH SPECT (REST AND PHARMACOLOGIC-STRESS) GATED LEFT VENTRICULAR WALL MOTION STUDY LEFT VENTRICULAR EJECTION FRACTION TECHNIQUE: Standard myocardial SPECT imaging was performed after resting intravenous injection of 10 mCi Tc-71mtetrofosmin. Subsequently, intravenous infusion of Lexiscan was performed under the supervision of the Cardiology staff. At peak effect of the drug, 30 mCi Tc-986metrofosmin was injected intravenously and standard myocardial SPECT imaging was performed. Quantitative gated imaging was also performed to evaluate left ventricular wall motion, and estimate left ventricular ejection fraction. COMPARISON:  None Available. FINDINGS: Perfusion: Moderate size region of moderate decreased activity in the apical, mid, and basilar segment of the inferior wall is fixed on rest and stress. No evidence reversible ischemia. Wall Motion: Normal left ventricular wall motion. No left ventricular dilation. Left Ventricular Ejection Fraction: 69 % End diastolic volume 48 ml End systolic volume 15 ml IMPRESSION: 1. Fixed defect in the inferior wall consistent with scar versus diaphragmatic attenuation. No wall motion abnormality favors attenuation. 2. Normal left ventricular wall motion. 3. Left ventricular ejection fraction 69% 4. Non invasive risk stratification*: Low *2012 Appropriate Use Criteria for Coronary Revascularization Focused Update: J Am Coll Cardiol. 208889;16(9):450-388http://content.onairportbarriers.comspx?articleid=[email or web address] Electronically Signed   By: StSuzy Bouchard.D.   On: 10/03/2021 15:15      LOS: 4 days    RaCordelia PocheMD [facility] 10/04/2021, 3:32 PM   If 7PM-7AM, please contact night-coverage [email or web address]

## 2021-10-04 NOTE — Plan of Care (Signed)

## 2021-10-04 NOTE — Plan of Care (Signed)
  Problem: Pain Managment: Goal: General experience of comfort will improve Outcome: Progressing   Problem: Safety: Goal: Ability to remain free from injury will improve Outcome: Progressing   

## 2021-10-05 ENCOUNTER — Encounter (HOSPITAL_COMMUNITY): Payer: Self-pay | Admitting: Orthopaedic Surgery

## 2021-10-05 ENCOUNTER — Inpatient Hospital Stay (HOSPITAL_COMMUNITY): Payer: Medicare HMO

## 2021-10-05 DIAGNOSIS — F419 Anxiety disorder, unspecified: Secondary | ICD-10-CM | POA: Diagnosis not present

## 2021-10-05 DIAGNOSIS — J449 Chronic obstructive pulmonary disease, unspecified: Secondary | ICD-10-CM | POA: Diagnosis not present

## 2021-10-05 DIAGNOSIS — I4891 Unspecified atrial fibrillation: Secondary | ICD-10-CM | POA: Diagnosis not present

## 2021-10-05 DIAGNOSIS — S72001A Fracture of unspecified part of neck of right femur, initial encounter for closed fracture: Secondary | ICD-10-CM | POA: Diagnosis not present

## 2021-10-05 LAB — GLUCOSE, CAPILLARY
Glucose-Capillary: 118 mg/dL — ABNORMAL HIGH (ref 70–99)
Glucose-Capillary: 122 mg/dL — ABNORMAL HIGH (ref 70–99)

## 2021-10-05 LAB — TYPE AND SCREEN
ABO/RH(D): O NEG
Antibody Screen: NEGATIVE

## 2021-10-05 MED ORDER — DILTIAZEM HCL-DEXTROSE 125-5 MG/125ML-% IV SOLN (PREMIX)
5.0000 mg/h | INTRAVENOUS | Status: DC
  Administered 2021-10-05: 5 mg/h via INTRAVENOUS
  Filled 2021-10-05 (×2): qty 125

## 2021-10-05 MED ORDER — FENTANYL CITRATE (PF) 100 MCG/2ML IJ SOLN
INTRAMUSCULAR | Status: AC
  Filled 2021-10-05: qty 2

## 2021-10-05 MED ORDER — CEFAZOLIN SODIUM-DEXTROSE 2-4 GM/100ML-% IV SOLN
2.0000 g | Freq: Once | INTRAVENOUS | Status: DC
  Administered 2021-10-06: 2 g via INTRAVENOUS

## 2021-10-05 MED ORDER — LACTATED RINGERS IV SOLN
INTRAVENOUS | Status: DC
Start: 2021-10-05 — End: 2021-10-05

## 2021-10-05 MED ORDER — ORAL CARE MOUTH RINSE: 15.0000 mL | Freq: Once | OROMUCOSAL | Status: AC

## 2021-10-05 MED ORDER — FENTANYL CITRATE (PF) 100 MCG/2ML IJ SOLN
50.0000 ug | Freq: Once | INTRAMUSCULAR | Status: AC
  Administered 2021-10-05: 50 ug via INTRAVENOUS

## 2021-10-05 MED ORDER — TRANEXAMIC ACID-NACL 1000-0.7 MG/100ML-% IV SOLN
1000.0000 mg | INTRAVENOUS | Status: DC
  Administered 2021-10-06: 1000 mg via INTRAVENOUS

## 2021-10-05 MED ORDER — FENTANYL CITRATE (PF) 250 MCG/5ML IJ SOLN
INTRAMUSCULAR | Status: AC
  Filled 2021-10-05: qty 5

## 2021-10-05 MED ORDER — CHLORHEXIDINE GLUCONATE 0.12 % MT SOLN
15.0000 mL | Freq: Once | OROMUCOSAL | Status: AC
  Administered 2021-10-05: 15 mL via OROMUCOSAL

## 2021-10-05 MED ORDER — TRANEXAMIC ACID-NACL 1000-0.7 MG/100ML-% IV SOLN
INTRAVENOUS | Status: AC
  Filled 2021-10-05: qty 100

## 2021-10-05 MED ORDER — CEFAZOLIN SODIUM-DEXTROSE 2-4 GM/100ML-% IV SOLN
INTRAVENOUS | Status: AC
  Filled 2021-10-05: qty 100

## 2021-10-05 MED ORDER — ESMOLOL HCL 100 MG/10ML IV SOLN
INTRAVENOUS | Status: AC
  Filled 2021-10-05: qty 10

## 2021-10-05 NOTE — Progress Notes (Signed)
Late entry: Pt arrived to short stay --HR 130's Afib. Anesthesia made aware, Cardizem drip ordered. Pt also c/o 5/10 pain, Fentanyl 50 mcg x1 ordered and given. Pt on monitor, BP cycles q79mn. New IV started, Cardizem gtt initiated with Dr. ERoanna Banningpresent in room.

## 2021-10-05 NOTE — Progress Notes (Signed)
PROGRESS NOTE    Jackson Martin  IFO:[id number] DOB: 01-Jul-1940 DOA: 09/30/2021 PCP: Monico Blitz, MD   Brief Narrative: Jackson Martin is a 81 y.o. male with a history of CAD, MI, atrial fibrillation on Eliquis, hypertension, hyperlipidemia, anxiety, prostate cancer. Patient presented secondary to a fall and found to have a right hip fracture. Patient was transferred from [facility] for cardiology evaluation for perioperative risk assessment. Orthopedic surgery consulted for surgical fixation.  Assessment and Plan:  Right hip fracture Secondary to fall. Orthopedic surgery consulted with plan for surgical fixation. Unknown timing of surgery at this time.  CAD No chest pain currently. Cardiology consulted for perioperative risk assessment and recommended nuclear stress test. Nuclear stress test with non-reversible ischemia.  Paroxysmal atrial fibrillation Patient is managed on Eliquis and diltiazem. Eliquis held on admission. Patient has been rate controlled but developed RVR prior to procedure likely related to not receiving AM dose of diltiazem.  -Give diltiazem PO dose now -Titrate down diltiazem IV infusion -Patient needs diltiazem the morning of surgery  COPD -Continue Breo Ellipta and Singulair  Anxiety -Continue home Zoloft and Klonopin  Pressure injury Lower/bilateral perineum. Present on admission.   DVT prophylaxis: SCDs Code Status:   Code Status: Full Code Family Communication: None at bedside Disposition Plan: Discharge pending orthopedic surgery management/recommendations and eventual PT/OT recommendations   Consultants:  Orthopedic surgery Cardiology  Procedures:  Nuclear stress test (8/1)  Antimicrobials: None    Subjective: No issues overnight. Patient ready for surgery.  After initial encounter: While in pre-op, patient developed RVR. Diltiazem IV drip initiated. Patient did not receive diltiazem PO this morning. Diltiazem IV  titrated up to 10 mg/hr with stable blood pressure.  Objective: BP (!) 137/96   Pulse (!) 102   Temp 98.6 F (37 C) (Oral)   Resp 16   Ht '5\' 10"'$  (1.778 m)   Wt 68 kg   SpO2 96%   BMI 21.52 kg/m   Examination:  General exam: Appears calm and comfortable Respiratory system: Clear to auscultation. Respiratory effort normal. Cardiovascular system: S1 & S2 heard. Normal rate. Gastrointestinal system: Abdomen is nondistended, soft and nontender. Normal bowel sounds heard. Central nervous system: Alert and oriented. No focal neurological deficits. Skin: No cyanosis. No rashes Psychiatry: Judgement and insight appear normal. Mood & affect appropriate.     Data Reviewed: I have personally reviewed following labs and imaging studies  CBC Lab Results  Component Value Date   WBC 8.5 10/02/2021   RBC 4.05 (L) 10/02/2021   HGB 12.7 (L) 10/02/2021   HCT 39.0 10/02/2021   MCV 96.3 10/02/2021   MCH 31.4 10/02/2021   PLT 220 10/02/2021   MCHC 32.6 10/02/2021   RDW 13.0 10/02/2021   LYMPHSABS 0.4 (L) 09/30/2021   MONOABS 0.8 09/30/2021   EOSABS 0.0 09/30/2021   BASOSABS 0.0 67/67/2094     Last metabolic panel Lab Results  Component Value Date   NA 141 10/02/2021   K 4.2 10/02/2021   CL 105 10/02/2021   CO2 28 10/02/2021   BUN 28 (H) 10/02/2021   CREATININE 1.13 10/02/2021   GLUCOSE 111 (H) 10/02/2021   GFRNONAA >60 10/02/2021   GFRAA >60 09/02/2019   CALCIUM 9.1 10/02/2021   PHOS 3.0 10/02/2021   PROT 6.5 02/20/2021   ALBUMIN 3.7 10/02/2021   BILITOT 0.8 02/20/2021   ALKPHOS 87 05/22/2020   AST 17 02/20/2021   ALT 14 02/20/2021   ANIONGAP 8 10/02/2021  GFR: Estimated Creatinine Clearance: 48.5 mL/min (by C-G formula based on SCr of 1.13 mg/dL).  Recent Results (from the past 240 hour(s))  Surgical PCR screen     Status: None   Collection Time: 10/01/21  2:01 PM   Specimen: Nasal Mucosa; Nasal Swab  Result Value Ref Range Status   MRSA, PCR NEGATIVE NEGATIVE  Final   Staphylococcus aureus NEGATIVE NEGATIVE Final    Comment: (NOTE) The Xpert SA Assay (FDA approved for NASAL specimens in patients 1 years of age and older), is one component of a comprehensive surveillance program. It is not intended to diagnose infection nor to guide or monitor treatment. Performed at [facility], [address], [place], [place] [address]       Radiology Studies: NM Myocar Multi W/Spect W/Wall Motion / EF  Result Date: 10/03/2021 CLINICAL DATA:  Dyspnea on exertion EXAM: MYOCARDIAL IMAGING WITH SPECT (REST AND PHARMACOLOGIC-STRESS) GATED LEFT VENTRICULAR WALL MOTION STUDY LEFT VENTRICULAR EJECTION FRACTION TECHNIQUE: Standard myocardial SPECT imaging was performed after resting intravenous injection of 10 mCi Tc-53mtetrofosmin. Subsequently, intravenous infusion of Lexiscan was performed under the supervision of the Cardiology staff. At peak effect of the drug, 30 mCi Tc-925metrofosmin was injected intravenously and standard myocardial SPECT imaging was performed. Quantitative gated imaging was also performed to evaluate left ventricular wall motion, and estimate left ventricular ejection fraction. COMPARISON:  None Available. FINDINGS: Perfusion: Moderate size region of moderate decreased activity in the apical, mid, and basilar segment of the inferior wall is fixed on rest and stress. No evidence reversible ischemia. Wall Motion: Normal left ventricular wall motion. No left ventricular dilation. Left Ventricular Ejection Fraction: 69 % End diastolic volume 48 ml End systolic volume 15 ml IMPRESSION: 1. Fixed defect in the inferior wall consistent with scar versus diaphragmatic attenuation. No wall motion abnormality favors attenuation. 2. Normal left ventricular wall motion. 3. Left ventricular ejection fraction 69% 4. Non invasive risk stratification*: Low *2012 Appropriate Use Criteria for Coronary Revascularization Focused Update: J Am Coll Cardiol.  209629;52(8):413-244http://content.onairportbarriers.comspx?articleid=1201161 Electronically Signed   By: StSuzy Bouchard.D.   On: 10/03/2021 15:15      LOS: 5 days    RaCordelia PocheMD [facility] 10/05/2021, 11:37 AM   If 7PM-7AM, please contact night-coverage [email or web address]

## 2021-10-05 NOTE — Progress Notes (Signed)
Patient ID: Jackson Martin, male   DOB: 02-22-1941, 81 y.o.   MRN: [id number] The patient understands fully while we are recommending surgical treatment for his right hip fracture.  He has a displaced femoral neck fracture.  The treatment is going to be likely a total hip arthroplasty versus a hemiarthroplasty depending on our intraoperative findings.  The risks and benefits of surgery been explained in detail and informed consent is obtained.  The patient is tachycardic in the short stay area and anesthesia is working on bringing his heart rate down before we can proceed with surgery today.

## 2021-10-05 NOTE — Progress Notes (Signed)
Pt's surgery postponed to 10/06/21. Report given to [facility] on 5N. Cardizem gtt at 10 mg/hr. HR 110, BP 120/80. Per Dr. Roanna Banning, Dr. Lonny Prude is aware pt's status and plan

## 2021-10-05 NOTE — Progress Notes (Signed)
Patient ID: Jackson Martin, male   DOB: 03-24-1940, 81 y.o.   MRN: [id number] Unfortunately the patient's surgery had to be canceled today.  His heart rate has been remaining tachycardic at over 100 and a diltiazem drip was started in the short stay area.  We waited a while to see if this would be able to get his rate under control and even with titrating the drip up slightly, they were unable to get his right below 100 which would be safer from a surgical standpoint.  I talked to the patient about this.  Unfortunate we need to delay his surgery today and get him back on the schedule for tomorrow afternoon.  This would be more medically appropriate.

## 2021-10-05 NOTE — Anesthesia Preprocedure Evaluation (Addendum)
Anesthesia Evaluation  Patient identified by MRN, date of birth, ID band Patient awake    Reviewed: Allergy & Precautions, NPO status , Patient's Chart, lab work & pertinent test results  Airway Mallampati: II  TM Distance: >3 FB Neck ROM: Full    Dental  (+) Edentulous Upper, Missing   Pulmonary asthma , COPD,  COPD inhaler, former smoker,    breath sounds clear to auscultation       Cardiovascular hypertension, + CAD, + Past MI and + Cardiac Stents  + dysrhythmias Atrial Fibrillation + Valvular Problems/Murmurs AS  Rhythm:Irregular Rate:Tachycardia + Systolic murmurs    Neuro/Psych Anxiety negative neurological ROS     GI/Hepatic Neg liver ROS, PUD,   Endo/Other  negative endocrine ROS  Renal/GU negative Renal ROS     Musculoskeletal negative musculoskeletal ROS (+)   Abdominal   Peds  Hematology  (+) Blood dyscrasia, ,   Anesthesia Other Findings right hip fracture  Reproductive/Obstetrics                           Anesthesia Physical Anesthesia Plan  ASA: 4  Anesthesia Plan: General   Post-op Pain Management: Ofirmev IV (intra-op)*   Induction: Intravenous  PONV Risk Score and Plan: 2 and Ondansetron, Dexamethasone and Treatment may vary due to age or medical condition  Airway Management Planned: Oral ETT  Additional Equipment:   Intra-op Plan:   Post-operative Plan: Extubation in OR  Informed Consent: I have reviewed the patients History and Physical, chart, labs and discussed the procedure including the risks, benefits and alternatives for the proposed anesthesia with the patient or authorized representative who has indicated his/her understanding and acceptance.     Dental advisory given  Plan Discussed with: CRNA and Surgeon  Anesthesia Plan Comments:       Anesthesia Quick Evaluation

## 2021-10-06 ENCOUNTER — Encounter (HOSPITAL_COMMUNITY)
Admission: EM | Disposition: A | Discharge status: Skilled Nursing Facility | Payer: Self-pay | Source: Home / Self Care | Attending: Family Medicine

## 2021-10-06 ENCOUNTER — Inpatient Hospital Stay (HOSPITAL_COMMUNITY): Payer: Medicare HMO

## 2021-10-06 ENCOUNTER — Inpatient Hospital Stay (HOSPITAL_COMMUNITY): Payer: Medicare HMO | Admitting: General Practice

## 2021-10-06 ENCOUNTER — Encounter (HOSPITAL_COMMUNITY): Payer: Self-pay | Admitting: Anesthesiology

## 2021-10-06 ENCOUNTER — Other Ambulatory Visit: Payer: Self-pay

## 2021-10-06 DIAGNOSIS — I10 Essential (primary) hypertension: Secondary | ICD-10-CM | POA: Diagnosis not present

## 2021-10-06 DIAGNOSIS — I252 Old myocardial infarction: Secondary | ICD-10-CM | POA: Diagnosis not present

## 2021-10-06 DIAGNOSIS — I2510 Atherosclerotic heart disease of native coronary artery without angina pectoris: Secondary | ICD-10-CM | POA: Diagnosis not present

## 2021-10-06 DIAGNOSIS — S72001A Fracture of unspecified part of neck of right femur, initial encounter for closed fracture: Secondary | ICD-10-CM

## 2021-10-06 DIAGNOSIS — J449 Chronic obstructive pulmonary disease, unspecified: Secondary | ICD-10-CM | POA: Diagnosis not present

## 2021-10-06 DIAGNOSIS — I4891 Unspecified atrial fibrillation: Secondary | ICD-10-CM | POA: Diagnosis not present

## 2021-10-06 DIAGNOSIS — F419 Anxiety disorder, unspecified: Secondary | ICD-10-CM | POA: Diagnosis not present

## 2021-10-06 HISTORY — PX: TOTAL HIP ARTHROPLASTY: SHX124

## 2021-10-06 LAB — COMPREHENSIVE METABOLIC PANEL
ALT: 12 U/L (ref 0–44)
AST: 16 U/L (ref 15–41)
Albumin: 3.2 g/dL — ABNORMAL LOW (ref 3.5–5.0)
Alkaline Phosphatase: 81 U/L (ref 38–126)
Anion gap: 8 (ref 5–15)
BUN: 19 mg/dL (ref 8–23)
CO2: 28 mmol/L (ref 22–32)
Calcium: 8.9 mg/dL (ref 8.9–10.3)
Chloride: 102 mmol/L (ref 98–111)
Creatinine, Ser: 1 mg/dL (ref 0.61–1.24)
GFR, Estimated: >60 mL/min (ref >60)
Glucose, Bld: 111 mg/dL — ABNORMAL HIGH (ref 70–99)
Potassium: 3.9 mmol/L (ref 3.5–5.1)
Sodium: 138 mmol/L (ref 135–145)
Total Bilirubin: 1.2 mg/dL (ref 0.3–1.2)
Total Protein: 5.7 g/dL — ABNORMAL LOW (ref 6.5–8.1)

## 2021-10-06 LAB — GLUCOSE, CAPILLARY: Glucose-Capillary: 144 mg/dL — ABNORMAL HIGH (ref 70–99)

## 2021-10-06 LAB — CK: Total CK: 55 U/L (ref 49–397)

## 2021-10-06 LAB — CBC
HCT: 39.7 % (ref 39.0–52.0)
Hemoglobin: 13.7 g/dL (ref 13.0–17.0)
MCH: 31.6 pg (ref 26.0–34.0)
MCHC: 34.5 g/dL (ref 30.0–36.0)
MCV: 91.5 fL (ref 80.0–100.0)
Platelets: 237 10*3/uL (ref 150–400)
RBC: 4.34 MIL/uL (ref 4.22–5.81)
RDW: 12.8 % (ref 11.5–15.5)
WBC: 5.6 10*3/uL (ref 4.0–10.5)
nRBC: 0 % (ref 0.0–0.2)

## 2021-10-06 SURGERY — ARTHROPLASTY, HIP, TOTAL, ANTERIOR APPROACH
Anesthesia: General | Site: Hip | Laterality: Right

## 2021-10-06 MED ORDER — BUPIVACAINE HCL (PF) 0.25 % IJ SOLN
INTRAMUSCULAR | Status: AC
  Filled 2021-10-06: qty 30

## 2021-10-06 MED ORDER — OXYCODONE HCL 5 MG PO TABS: 10.0000 mg | ORAL_TABLET | ORAL | Status: DC | PRN

## 2021-10-06 MED ORDER — ACETAMINOPHEN 10 MG/ML IV SOLN
INTRAVENOUS | Status: DC | PRN
  Administered 2021-10-06: 1000 mg via INTRAVENOUS

## 2021-10-06 MED ORDER — TRANEXAMIC ACID 1000 MG/10ML IV SOLN
INTRAVENOUS | Status: DC | PRN
  Administered 2021-10-06: 2000 mg via TOPICAL

## 2021-10-06 MED ORDER — ONDANSETRON HCL 4 MG/2ML IJ SOLN: 4.0000 mg | Freq: Once | INTRAMUSCULAR | Status: DC | PRN

## 2021-10-06 MED ORDER — OXYCODONE-ACETAMINOPHEN 5-325 MG PO TABS: 1.0000 | ORAL_TABLET | Freq: Two times a day (BID) | ORAL | 0 refills | Status: DC | PRN

## 2021-10-06 MED ORDER — AMISULPRIDE (ANTIEMETIC) 5 MG/2ML IV SOLN: 10.0000 mg | Freq: Once | INTRAVENOUS | Status: DC | PRN

## 2021-10-06 MED ORDER — FENTANYL CITRATE (PF) 100 MCG/2ML IJ SOLN
25.0000 ug | INTRAMUSCULAR | Status: DC | PRN
  Administered 2021-10-06: 50 ug via INTRAVENOUS

## 2021-10-06 MED ORDER — ACETAMINOPHEN 325 MG PO TABS
325.0000 mg | ORAL_TABLET | Freq: Four times a day (QID) | ORAL | Status: DC | PRN
  Administered 2021-10-09: 650 mg via ORAL
  Filled 2021-10-06 (×3): qty 2

## 2021-10-06 MED ORDER — DOCUSATE SODIUM 100 MG PO CAPS
100.0000 mg | ORAL_CAPSULE | Freq: Two times a day (BID) | ORAL | Status: DC
  Administered 2021-10-06 – 2021-10-09 (×7): 100 mg via ORAL
  Filled 2021-10-06 (×7): qty 1

## 2021-10-06 MED ORDER — ORAL CARE MOUTH RINSE: 15.0000 mL | Freq: Once | OROMUCOSAL | Status: AC

## 2021-10-06 MED ORDER — METHOCARBAMOL 500 MG PO TABS: 500.0000 mg | ORAL_TABLET | Freq: Four times a day (QID) | ORAL | Status: DC | PRN

## 2021-10-06 MED ORDER — FENTANYL CITRATE (PF) 250 MCG/5ML IJ SOLN
INTRAMUSCULAR | Status: DC | PRN
Start: 2021-10-06 — End: 2021-10-06
  Administered 2021-10-06: 50 ug via INTRAVENOUS
  Administered 2021-10-06: 100 ug via INTRAVENOUS

## 2021-10-06 MED ORDER — MENTHOL 3 MG MT LOZG: 1.0000 | LOZENGE | OROMUCOSAL | Status: DC | PRN

## 2021-10-06 MED ORDER — LACTATED RINGERS IV SOLN: INTRAVENOUS | Status: DC

## 2021-10-06 MED ORDER — LIDOCAINE 2% (20 MG/ML) 5 ML SYRINGE
INTRAMUSCULAR | Status: DC | PRN
  Administered 2021-10-06: 60 mg via INTRAVENOUS

## 2021-10-06 MED ORDER — SODIUM CHLORIDE 0.9 % IR SOLN
Status: DC | PRN
  Administered 2021-10-06: 1000 mL

## 2021-10-06 MED ORDER — CEFAZOLIN SODIUM-DEXTROSE 2-4 GM/100ML-% IV SOLN
INTRAVENOUS | Status: AC
  Filled 2021-10-06: qty 100

## 2021-10-06 MED ORDER — DEXAMETHASONE SODIUM PHOSPHATE 10 MG/ML IJ SOLN
INTRAMUSCULAR | Status: AC
  Filled 2021-10-06: qty 1

## 2021-10-06 MED ORDER — CHLORHEXIDINE GLUCONATE 0.12 % MT SOLN: 15.0000 mL | Freq: Once | OROMUCOSAL | Status: AC

## 2021-10-06 MED ORDER — FENTANYL CITRATE (PF) 250 MCG/5ML IJ SOLN
INTRAMUSCULAR | Status: AC
  Filled 2021-10-06: qty 5

## 2021-10-06 MED ORDER — ROCURONIUM BROMIDE 10 MG/ML (PF) SYRINGE
PREFILLED_SYRINGE | INTRAVENOUS | Status: DC | PRN
  Administered 2021-10-06: 50 mg via INTRAVENOUS
  Administered 2021-10-06: 20 mg via INTRAVENOUS

## 2021-10-06 MED ORDER — PHENOL 1.4 % MT LIQD: 1.0000 | OROMUCOSAL | Status: DC | PRN

## 2021-10-06 MED ORDER — PHENYLEPHRINE 80 MCG/ML (10ML) SYRINGE FOR IV PUSH (FOR BLOOD PRESSURE SUPPORT)
PREFILLED_SYRINGE | INTRAVENOUS | Status: AC
  Filled 2021-10-06: qty 10

## 2021-10-06 MED ORDER — PHENYLEPHRINE HCL-NACL 20-0.9 MG/250ML-% IV SOLN
INTRAVENOUS | Status: DC | PRN
  Administered 2021-10-06: 40 ug/min via INTRAVENOUS

## 2021-10-06 MED ORDER — 0.9 % SODIUM CHLORIDE (POUR BTL) OPTIME
TOPICAL | Status: DC | PRN
  Administered 2021-10-06: 1000 mL

## 2021-10-06 MED ORDER — HYDROMORPHONE HCL 1 MG/ML IJ SOLN
0.5000 mg | INTRAMUSCULAR | Status: DC | PRN
  Administered 2021-10-08: 1 mg via INTRAVENOUS
  Filled 2021-10-06: qty 1

## 2021-10-06 MED ORDER — ACETAMINOPHEN 10 MG/ML IV SOLN: 1000.0000 mg | Freq: Once | INTRAVENOUS | Status: DC | PRN

## 2021-10-06 MED ORDER — PRONTOSAN WOUND IRRIGATION OPTIME
TOPICAL | Status: DC | PRN
  Administered 2021-10-06: 1 via TOPICAL

## 2021-10-06 MED ORDER — APIXABAN 5 MG PO TABS
5.0000 mg | ORAL_TABLET | Freq: Two times a day (BID) | ORAL | Status: DC
  Administered 2021-10-07 – 2021-10-09 (×6): 5 mg via ORAL
  Filled 2021-10-06 (×6): qty 1

## 2021-10-06 MED ORDER — ONDANSETRON HCL 4 MG PO TABS: 4.0000 mg | ORAL_TABLET | Freq: Four times a day (QID) | ORAL | Status: DC | PRN

## 2021-10-06 MED ORDER — VANCOMYCIN HCL 1000 MG IV SOLR
INTRAVENOUS | Status: DC | PRN
  Administered 2021-10-06: 1000 mg via TOPICAL

## 2021-10-06 MED ORDER — FENTANYL CITRATE (PF) 100 MCG/2ML IJ SOLN
INTRAMUSCULAR | Status: AC
  Filled 2021-10-06: qty 2

## 2021-10-06 MED ORDER — PROPOFOL 10 MG/ML IV BOLUS
INTRAVENOUS | Status: AC
  Filled 2021-10-06: qty 20

## 2021-10-06 MED ORDER — ONDANSETRON HCL 4 MG/2ML IJ SOLN: 4.0000 mg | Freq: Four times a day (QID) | INTRAMUSCULAR | Status: DC | PRN

## 2021-10-06 MED ORDER — CHLORHEXIDINE GLUCONATE 0.12 % MT SOLN
OROMUCOSAL | Status: AC
  Administered 2021-10-06: 15 mL via OROMUCOSAL
  Filled 2021-10-06: qty 15

## 2021-10-06 MED ORDER — ONDANSETRON HCL 4 MG/2ML IJ SOLN
INTRAMUSCULAR | Status: AC
  Filled 2021-10-06: qty 2

## 2021-10-06 MED ORDER — TRANEXAMIC ACID-NACL 1000-0.7 MG/100ML-% IV SOLN
1000.0000 mg | Freq: Once | INTRAVENOUS | Status: AC
  Administered 2021-10-06: 1000 mg via INTRAVENOUS
  Filled 2021-10-06: qty 100

## 2021-10-06 MED ORDER — TRANEXAMIC ACID-NACL 1000-0.7 MG/100ML-% IV SOLN
INTRAVENOUS | Status: AC
  Filled 2021-10-06: qty 200

## 2021-10-06 MED ORDER — TRANEXAMIC ACID-NACL 1000-0.7 MG/100ML-% IV SOLN
INTRAVENOUS | Status: AC
  Filled 2021-10-06: qty 100

## 2021-10-06 MED ORDER — ALUM & MAG HYDROXIDE-SIMETH 200-200-20 MG/5ML PO SUSP: 30.0000 mL | ORAL | Status: DC | PRN

## 2021-10-06 MED ORDER — SODIUM CHLORIDE 0.9 % IV SOLN: INTRAVENOUS | Status: DC

## 2021-10-06 MED ORDER — SORBITOL 70 % SOLN: 30.0000 mL | Freq: Every day | Status: DC | PRN

## 2021-10-06 MED ORDER — VANCOMYCIN HCL 1000 MG IV SOLR
INTRAVENOUS | Status: AC
  Filled 2021-10-06: qty 20

## 2021-10-06 MED ORDER — SODIUM CHLORIDE 0.9 % IV SOLN
2000.0000 mg | INTRAVENOUS | Status: DC
Start: 2021-10-06 — End: 2021-10-06
  Filled 2021-10-06: qty 20

## 2021-10-06 MED ORDER — SODIUM CHLORIDE (PF) 0.9 % IJ SOLN
INTRAMUSCULAR | Status: DC | PRN
  Administered 2021-10-06: 60 mL

## 2021-10-06 MED ORDER — PHENYLEPHRINE 80 MCG/ML (10ML) SYRINGE FOR IV PUSH (FOR BLOOD PRESSURE SUPPORT)
PREFILLED_SYRINGE | INTRAVENOUS | Status: DC | PRN
  Administered 2021-10-06: 160 ug via INTRAVENOUS
  Administered 2021-10-06: 80 ug via INTRAVENOUS

## 2021-10-06 MED ORDER — METHOCARBAMOL 1000 MG/10ML IJ SOLN
500.0000 mg | Freq: Four times a day (QID) | INTRAVENOUS | Status: DC | PRN
  Filled 2021-10-06: qty 5

## 2021-10-06 MED ORDER — DEXAMETHASONE SODIUM PHOSPHATE 10 MG/ML IJ SOLN
INTRAMUSCULAR | Status: DC | PRN
  Administered 2021-10-06: 5 mg via INTRAVENOUS

## 2021-10-06 MED ORDER — ONDANSETRON HCL 4 MG/2ML IJ SOLN
INTRAMUSCULAR | Status: DC | PRN
  Administered 2021-10-06: 4 mg via INTRAVENOUS

## 2021-10-06 MED ORDER — PROPOFOL 10 MG/ML IV BOLUS
INTRAVENOUS | Status: DC | PRN
  Administered 2021-10-06: 50 mg via INTRAVENOUS

## 2021-10-06 MED ORDER — CEFAZOLIN SODIUM-DEXTROSE 2-4 GM/100ML-% IV SOLN
2.0000 g | Freq: Four times a day (QID) | INTRAVENOUS | Status: AC
  Administered 2021-10-06 – 2021-10-07 (×3): 2 g via INTRAVENOUS
  Filled 2021-10-06 (×3): qty 100

## 2021-10-06 MED ORDER — ACETAMINOPHEN 500 MG PO TABS
1000.0000 mg | ORAL_TABLET | Freq: Four times a day (QID) | ORAL | Status: AC
  Administered 2021-10-06 – 2021-10-07 (×3): 1000 mg via ORAL
  Filled 2021-10-06 (×4): qty 2

## 2021-10-06 MED ORDER — SUGAMMADEX SODIUM 200 MG/2ML IV SOLN
INTRAVENOUS | Status: DC | PRN
  Administered 2021-10-06: 200 mg via INTRAVENOUS

## 2021-10-06 MED ORDER — OXYCODONE HCL 5 MG PO TABS
5.0000 mg | ORAL_TABLET | ORAL | Status: DC | PRN
  Administered 2021-10-06: 10 mg via ORAL
  Administered 2021-10-07: 5 mg via ORAL
  Administered 2021-10-07 (×2): 10 mg via ORAL
  Administered 2021-10-08 – 2021-10-09 (×2): 5 mg via ORAL
  Administered 2021-10-09: 10 mg via ORAL
  Filled 2021-10-06 (×2): qty 1
  Filled 2021-10-06 (×3): qty 2
  Filled 2021-10-06: qty 1
  Filled 2021-10-06: qty 2

## 2021-10-06 MED ORDER — LIDOCAINE 2% (20 MG/ML) 5 ML SYRINGE
INTRAMUSCULAR | Status: AC
  Filled 2021-10-06: qty 5

## 2021-10-06 MED ORDER — POLYETHYLENE GLYCOL 3350 17 G PO PACK: 17.0000 g | PACK | Freq: Every day | ORAL | Status: DC | PRN

## 2021-10-06 MED ORDER — BUPIVACAINE LIPOSOME 1.3 % IJ SUSP
INTRAMUSCULAR | Status: AC
  Filled 2021-10-06: qty 20

## 2021-10-06 SURGICAL SUPPLY — 57 items
ACETAB CUP W/GRIPTION 54 (Plate) ×2 IMPLANT
APL SKNCLS STERI-STRIP NONHPOA (GAUZE/BANDAGES/DRESSINGS) ×1
BAG COUNTER SPONGE SURGICOUNT (BAG) ×2 IMPLANT
BAG SPNG CNTER NS LX DISP (BAG) ×1
BENZOIN TINCTURE PRP APPL 2/3 (GAUZE/BANDAGES/DRESSINGS) ×2 IMPLANT
BLADE CLIPPER SURG (BLADE) IMPLANT
BLADE SAW SGTL 18X1.27X75 (BLADE) ×2 IMPLANT
COVER SURGICAL LIGHT HANDLE (MISCELLANEOUS) ×2 IMPLANT
CUP ACETAB W/GRIPTION 54 (Plate) IMPLANT
DRAPE C-ARM 42X72 X-RAY (DRAPES) ×2 IMPLANT
DRAPE STERI IOBAN 125X83 (DRAPES) ×2 IMPLANT
DRAPE U-SHAPE 47X51 STRL (DRAPES) ×6 IMPLANT
DRSG AQUACEL AG ADV 3.5X10 (GAUZE/BANDAGES/DRESSINGS) ×2 IMPLANT
DURAPREP 26ML APPLICATOR (WOUND CARE) ×3 IMPLANT
ELECT BLADE 4.0 EZ CLEAN MEGAD (MISCELLANEOUS) ×2
ELECT BLADE 6.5 EXT (BLADE) ×1 IMPLANT
ELECT REM PT RETURN 9FT ADLT (ELECTROSURGICAL) ×2
ELECTRODE BLDE 4.0 EZ CLN MEGD (MISCELLANEOUS) ×1 IMPLANT
ELECTRODE REM PT RTRN 9FT ADLT (ELECTROSURGICAL) ×1 IMPLANT
FACESHIELD WRAPAROUND (MASK) ×6 IMPLANT
FACESHIELD WRAPAROUND OR TEAM (MASK) ×2 IMPLANT
GLOVE BIOGEL PI IND STRL 8 (GLOVE) ×2 IMPLANT
GLOVE BIOGEL PI INDICATOR 8 (GLOVE) ×2
GLOVE ECLIPSE 8.0 STRL XLNG CF (GLOVE) ×2 IMPLANT
GLOVE ORTHO TXT STRL SZ7.5 (GLOVE) ×4 IMPLANT
GOWN STRL REUS W/ TWL LRG LVL3 (GOWN DISPOSABLE) ×2 IMPLANT
GOWN STRL REUS W/ TWL XL LVL3 (GOWN DISPOSABLE) ×2 IMPLANT
GOWN STRL REUS W/TWL LRG LVL3 (GOWN DISPOSABLE) ×4
GOWN STRL REUS W/TWL XL LVL3 (GOWN DISPOSABLE) ×4
HANDPIECE INTERPULSE COAX TIP (DISPOSABLE) ×2
HEAD M SROM 36MM PLUS 1.5 (Hips) IMPLANT
KIT BASIN OR (CUSTOM PROCEDURE TRAY) ×2 IMPLANT
KIT TURNOVER KIT B (KITS) ×2 IMPLANT
LINER NEUTRAL 54X36MM PLUS 4 (Hips) ×1 IMPLANT
MANIFOLD NEPTUNE II (INSTRUMENTS) ×2 IMPLANT
NS IRRIG 1000ML POUR BTL (IV SOLUTION) ×2 IMPLANT
PACK TOTAL JOINT (CUSTOM PROCEDURE TRAY) ×2 IMPLANT
PAD ARMBOARD 7.5X6 YLW CONV (MISCELLANEOUS) ×2 IMPLANT
SET HNDPC FAN SPRY TIP SCT (DISPOSABLE) ×1 IMPLANT
SROM M HEAD 36MM PLUS 1.5 (Hips) ×2 IMPLANT
STAPLER VISISTAT 35W (STAPLE) IMPLANT
STEM FEMORAL SZ 6MM STD ACTIS (Stem) ×1 IMPLANT
STRIP CLOSURE SKIN 1/2X4 (GAUZE/BANDAGES/DRESSINGS) ×4 IMPLANT
SUT ETHIBOND NAB CT1 #1 30IN (SUTURE) ×2 IMPLANT
SUT MNCRL AB 4-0 PS2 18 (SUTURE) ×1 IMPLANT
SUT VIC AB 0 CT1 27 (SUTURE) ×2
SUT VIC AB 0 CT1 27XBRD ANBCTR (SUTURE) ×1 IMPLANT
SUT VIC AB 1 CT1 27 (SUTURE) ×2
SUT VIC AB 1 CT1 27XBRD ANBCTR (SUTURE) ×1 IMPLANT
SUT VIC AB 2-0 CT1 27 (SUTURE) ×2
SUT VIC AB 2-0 CT1 TAPERPNT 27 (SUTURE) ×1 IMPLANT
TOWEL GREEN STERILE (TOWEL DISPOSABLE) ×2 IMPLANT
TOWEL GREEN STERILE FF (TOWEL DISPOSABLE) ×2 IMPLANT
TRAY CATH 16FR W/PLASTIC CATH (SET/KITS/TRAYS/PACK) IMPLANT
TRAY FOLEY W/BAG SLVR 16FR (SET/KITS/TRAYS/PACK)
TRAY FOLEY W/BAG SLVR 16FR ST (SET/KITS/TRAYS/PACK) IMPLANT
WATER STERILE IRR 1000ML POUR (IV SOLUTION) ×4 IMPLANT

## 2021-10-06 NOTE — Discharge Instructions (Signed)

## 2021-10-06 NOTE — Anesthesia Postprocedure Evaluation (Signed)
Anesthesia Post Note  Patient: Jackson Martin  Procedure(s) Performed: TOTAL HIP ARTHROPLASTY ANTERIOR APPROACH (Right: Hip)     Patient location during evaluation: PACU Anesthesia Type: General Level of consciousness: awake and alert Pain management: pain level controlled Vital Signs Assessment: post-procedure vital signs reviewed and stable Respiratory status: spontaneous breathing, nonlabored ventilation, respiratory function stable and patient connected to nasal cannula oxygen Cardiovascular status: blood pressure returned to baseline and stable Postop Assessment: no apparent nausea or vomiting Anesthetic complications: no   No notable events documented.  Last Vitals:  Vitals:   10/06/21 1500 10/06/21 1509  BP: 129/67 128/85  Pulse: 88 86  Resp: 13 (!) 25  Temp:  36.7 C  SpO2:  98%    Last Pain:  Vitals:   10/06/21 1509  TempSrc:   PainSc: 0-No pain                 Corlene Sabia,W. EDMOND

## 2021-10-06 NOTE — Transfer of Care (Signed)
Immediate Anesthesia Transfer of Care Note  Patient: Jackson Martin  Procedure(s) Performed: TOTAL HIP ARTHROPLASTY ANTERIOR APPROACH (Right: Hip)  Patient Location: PACU  Anesthesia Type:General  Level of Consciousness: awake and drowsy  Airway & Oxygen Therapy: Patient Spontanous Breathing  Post-op Assessment: Report given to RN and Post -op Vital signs reviewed and stable  Post vital signs: Reviewed and stable  Last Vitals:  Vitals Value Taken Time  BP 150/79 10/06/21 1439  Temp    Pulse 96 10/06/21 1439  Resp 18 10/06/21 1439  SpO2 91 % 10/06/21 1439  Vitals shown include unvalidated device data.  Last Pain:  Vitals:   10/06/21 1134  TempSrc: Oral  PainSc: 5       Patients Stated Pain Goal: 2 (17/79/39 0300)  Complications: No notable events documented.

## 2021-10-06 NOTE — Anesthesia Procedure Notes (Signed)
Procedure Name: Intubation Date/Time: 10/06/2021 1:09 PM  Performed by: Dorann Lodge, CRNAPre-anesthesia Checklist: Patient identified, Emergency Drugs available, Suction available and Patient being monitored Patient Re-evaluated:Patient Re-evaluated prior to induction Oxygen Delivery Method: Circle System Utilized Preoxygenation: Pre-oxygenation with 100% oxygen Induction Type: IV induction Ventilation: Mask ventilation without difficulty Laryngoscope Size: Mac and 4 Grade View: Grade I Tube type: Oral Tube size: 7.5 mm Number of attempts: 1 Airway Equipment and Method: Stylet Placement Confirmation: ETT inserted through vocal cords under direct vision, positive ETCO2 and breath sounds checked- equal and bilateral Secured at: 23 cm Tube secured with: Tape Dental Injury: Teeth and Oropharynx as per pre-operative assessment

## 2021-10-06 NOTE — Consult Note (Signed)
ORTHOPAEDIC CONSULTATION  REQUESTING PHYSICIAN: Mariel Aloe, MD  Chief Complaint: Right femoral neck fracture  HPI: Jackson Martin fell at home a couple of days ago. He had immediate right hip pain and could not get up. He was discovered by family about 12h later and brought to [facility]. X-rays showed a right hip fx and surgery was recommended but pt needs specialized cardiac clearance so he was transferred to [facility] to receive that. The pt is somewhat confused this morning and doesn't remember the fall. He lives at home alone and does not use any assistive devices Ortho consulted for surgical evaluation.  Past Medical History:  Diagnosis Date   Anxiety    Asthmatic bronchitis    Atrial fibrillation (HCC)    Backache    Colon polyps    COPD (chronic obstructive pulmonary disease) ([place])    Diverticulitis 12/2019   Esophageal reflux    Essential hypertension    Gout    Hemorrhoid    Hyperlipidemia    Insomnia    Ischemic heart disease    MI (myocardial infarction) ([place]) 2003   Neck pain    Plantar fasciitis    Prostate cancer [facility])    Past Surgical History:  Procedure Laterality Date   COLONOSCOPY     COLONOSCOPY N/A 11/15/2016   Procedure: COLONOSCOPY;  Surgeon: Rogene Houston, MD;  [facility]: [facility];  Service: Endoscopy;  Laterality: N/A;  1200   COLONOSCOPY WITH PROPOFOL N/A 01/20/2020   Procedure: COLONOSCOPY WITH PROPOFOL;  Surgeon: Rogene Houston, MD;  Location: [facility];  Service: Endoscopy;  Laterality: N/A;  1:15, pt refused to change time   CORONARY ANGIOPLASTY WITH STENT PLACEMENT  2007   4 stents placed   POLYPECTOMY  11/15/2016   Procedure: POLYPECTOMY;  Surgeon: Rogene Houston, MD;  Location: [facility];  Service: Endoscopy;;  colon   POLYPECTOMY  01/20/2020   Procedure: POLYPECTOMY;  Surgeon: Rogene Houston, MD;  [facility];  Service: Endoscopy;;   PROSTATECTOMY  2001   Social History   Socioeconomic History   Marital status:  Single    Spouse name: Not on file   Number of children: Not on file   Years of education: Not on file   Highest education level: Not on file  Occupational History   Not on file  Tobacco Use   Smoking status: Former    Packs/day: 0.75    Years: 3.00    Total pack years: 2.25    Types: Cigarettes    Quit date: 03/06/1991    Years since quitting: 30.6   Smokeless tobacco: Never  Vaping Use   Vaping Use: Never used  Substance and Sexual Activity   Alcohol use: No   Drug use: No   Sexual activity: Not on file  Other Topics Concern   Not on file  Social History Narrative   Not on file   Social Determinants of Health   Financial Resource Strain: Not on file  Food Insecurity: Not on file  Transportation Needs: Not on file  Physical Activity: Not on file  Stress: Not on file  Social Connections: Not on file   Family History  Problem Relation Age of Onset   Hypertension Mother    Heart disease Mother    Heart disease Maternal Aunt    Heart disease Maternal Uncle    Heart disease Maternal Grandmother    Heart disease Maternal Grandfather    Colon cancer Brother  Breast cancer Sister    Lung cancer Sister    Lung disease Sister    No Known Allergies Prior to Admission medications   Medication Sig Start Date End Date Taking? Authorizing Provider  alfuzosin (UROXATRAL) 10 MG 24 hr tablet Take 10 mg by mouth daily. 06/15/21  Yes [provider]  allopurinol (ZYLOPRIM) 300 MG tablet Take 300 mg by mouth daily.    Yes [provider]  apixaban (ELIQUIS) 5 MG TABS tablet Take 1 tablet (5 mg total) by mouth 2 (two) times daily. 08/02/21  Yes Satira Sark, MD  atorvastatin (LIPITOR) 20 MG tablet Take 20 mg by mouth at bedtime.    Yes [provider]  buPROPion (WELLBUTRIN XL) 150 MG 24 hr tablet Take 150 mg by mouth daily. 09/11/21  Yes [provider]  citalopram (CELEXA) 20 MG tablet Take 20 mg by mouth daily.   Yes [provider]  clonazePAM (KLONOPIN) 0.5 MG tablet Take 0.5 mg by mouth 3 (three) times daily.  10/19/16  Yes [provider]  diltiazem (CARDIZEM CD) 240 MG 24 hr capsule Take 1 capsule (240 mg total) by mouth daily. 01/16/21  Yes Satira Sark, MD  HYDROcodone-acetaminophen (NORCO/VICODIN) 5-325 MG tablet Take 1 tablet by mouth 2 (two) times daily as needed. 09/29/21  Yes [provider]  magnesium hydroxide (MILK OF MAGNESIA) 400 MG/5ML suspension Take 15 mLs by mouth at bedtime as needed for mild constipation.   Yes [provider]  montelukast (SINGULAIR) 10 MG tablet Take 10 mg by mouth daily.   Yes [provider]  oxyCODONE-acetaminophen (PERCOCET) 5-325 MG tablet Take 1-2 tablets by mouth 2 (two) times daily as needed for severe pain. 10/06/21  Yes Leandrew Koyanagi, MD  psyllium (METAMUCIL SMOOTH TEXTURE) 58.6 % powder Take 1 packet by mouth daily. 01/31/21  Yes Rehman, Mechele Dawley, MD  sertraline (ZOLOFT) 100 MG tablet Take 100 mg by mouth daily as needed (anxiety). 12/04/19  Yes [provider]  clotrimazole-betamethasone (LOTRISONE) cream Apply 1 Application topically daily. 05/17/21   [provider]  Elastic Bandages & Supports ([place]) [place] 1 each by Does not apply route as directed. 1 pair of low pressure below the knee compression stockings Dx: mild orthostatic hypotension 04/21/19   Satira Sark, MD   No results found.  All pertinent xrays, MRI, CT independently reviewed and interpreted  Positive ROS: All other systems have been reviewed and were otherwise negative with the exception of those mentioned in the HPI and as above.  Physical Exam: General: No acute distress Cardiovascular: No pedal edema Respiratory: No cyanosis, no use of accessory musculature GI: No organomegaly, abdomen is soft and non-tender Skin: No lesions in the area of chief complaint Neurologic: Sensation intact distally Psychiatric:  Patient is at baseline mood and affect Lymphatic: No axillary or cervical lymphadenopathy  MUSCULOSKELETAL:  - severe pain with movement of the hip and extremity - skin intact - NVI distally - compartments soft  Assessment: Right femoral neck fracture  Plan: - surgery postponed yesterday due to afib with rvr, he's now rate controlled - due to OR availability, I will be performing the surgery - informed consent obtained from patient to perform THA based on baseline activity level and function - may resume eliquis tomorrow morning  Thank you for the consult and the opportunity to see Jackson Martin. Eduard Roux, MD [facility] 12:12 PM

## 2021-10-06 NOTE — H&P (Signed)

## 2021-10-06 NOTE — Progress Notes (Signed)
   10/06/21 1954  Assess: MEWS Score  Temp 97.9 F (36.6 C)  BP (!) 140/85  MAP (mmHg) 103  Pulse Rate (!) 114  Resp 16  Level of Consciousness Alert  SpO2 98 %  O2 Device Room Air  Assess: MEWS Score  MEWS Temp 0  MEWS Systolic 0  MEWS Pulse 2  MEWS RR 0  MEWS LOC 0  MEWS Score 2  MEWS Score Color Yellow  Assess: if the MEWS score is Yellow or Red  Were vital signs taken at a resting state? Yes  Does the patient meet 2 or more of the SIRS criteria? No  MEWS guidelines implemented *See Row Information* No, previously yellow, continue vital signs every 4 hours  Treat  MEWS Interventions Administered prn meds/treatments  Pain Scale 0-10  Pain Score 7  Pain Intervention(s) Medication (See eMAR)  Take Vital Signs  Increase Vital Sign Frequency  Yellow: Q 2hr X 2 then Q 4hr X 2, if remains yellow, continue Q 4hrs  Document  Progress note created (see row info) Yes  Assess: SIRS CRITERIA  SIRS Temperature  0  SIRS Pulse 1  SIRS Respirations  0  SIRS WBC 1  SIRS Score Sum  2

## 2021-10-06 NOTE — Progress Notes (Addendum)
PROGRESS NOTE    Jackson Martin  QIH:[id number] DOB: 05-Dec-1940 DOA: 09/30/2021 PCP: Monico Blitz, MD   Brief Narrative: Jackson Martin is a 81 y.o. male with a history of CAD, MI, atrial fibrillation on Eliquis, hypertension, hyperlipidemia, anxiety, prostate cancer. Patient presented secondary to a fall and found to have a right hip fracture. Patient was transferred from [facility] for cardiology evaluation for perioperative risk assessment. Orthopedic surgery consulted for surgical fixation.  Assessment and Plan:  Right hip fracture Secondary to fall. Orthopedic surgery consulted with plan for surgical fixation. Attempt was made on 8/3 for surgical management but was deferred secondary to uncontrolled atrial fibrillation. -Orthopedic surgery recommendations: plan for surgery this afternoon  CAD No chest pain currently. Cardiology consulted for perioperative risk assessment and recommended nuclear stress test. Nuclear stress test with non-reversible ischemia.  Paroxysmal atrial fibrillation Patient is managed on Eliquis and diltiazem. Eliquis held on admission. Patient had been rate controlled but developed RVR prior to procedure on 8/3, likely related to not receiving AM dose of diltiazem. He was treated transiently with diltiazem IV infusion which is now discontinued. -Continue diltiazem -Hold Eliquis  COPD -Continue Breo Ellipta and Singulair  Anxiety -Continue home Zoloft and Klonopin  Pressure injury Lower/bilateral perineum. Present on admission.   DVT prophylaxis: SCDs Code Status:   Code Status: Full Code Family Communication: None at bedside Disposition Plan: Discharge pending orthopedic surgery management/recommendations and eventual PT/OT recommendations   Consultants:  Orthopedic surgery Cardiology  Procedures:  Nuclear stress test (8/1)  Antimicrobials: None    Subjective: Patient reports no issues overnight or this morning. No  significant issues with pain.   Objective: BP 128/83 (BP Location: Right Arm)   Pulse 99   Temp 98.7 F (37.1 C)   Resp 18   Ht '5\' 10"'$  (1.778 m)   Wt 68 kg   SpO2 97%   BMI 21.52 kg/m   Examination:  General exam: Appears calm and comfortable Respiratory system: Clear to auscultation. Respiratory effort normal. Cardiovascular system: S1 & S2 heard, irregular rhythm with fast heart rate. Gastrointestinal system: Abdomen is nondistended, soft and nontender. Normal bowel sounds heard. Central nervous system: Alert and oriented. No focal neurological deficits. Musculoskeletal: No edema Skin: No cyanosis. No rashes Psychiatry: Judgement and insight appear normal. Mood & affect appropriate.    Data Reviewed: I have personally reviewed following labs and imaging studies  CBC Lab Results  Component Value Date   WBC 8.5 10/02/2021   RBC 4.05 (L) 10/02/2021   HGB 12.7 (L) 10/02/2021   HCT 39.0 10/02/2021   MCV 96.3 10/02/2021   MCH 31.4 10/02/2021   PLT 220 10/02/2021   MCHC 32.6 10/02/2021   RDW 13.0 10/02/2021   LYMPHSABS 0.4 (L) 09/30/2021   MONOABS 0.8 09/30/2021   EOSABS 0.0 09/30/2021   BASOSABS 0.0 87/56/4332     Last metabolic panel Lab Results  Component Value Date   NA 141 10/02/2021   K 4.2 10/02/2021   CL 105 10/02/2021   CO2 28 10/02/2021   BUN 28 (H) 10/02/2021   CREATININE 1.13 10/02/2021   GLUCOSE 111 (H) 10/02/2021   GFRNONAA >60 10/02/2021   GFRAA >60 09/02/2019   CALCIUM 9.1 10/02/2021   PHOS 3.0 10/02/2021   PROT 6.5 02/20/2021   ALBUMIN 3.7 10/02/2021   BILITOT 0.8 02/20/2021   ALKPHOS 87 05/22/2020   AST 17 02/20/2021   ALT 14 02/20/2021   ANIONGAP 8 10/02/2021    GFR: Estimated  Creatinine Clearance: 48.5 mL/min (by C-G formula based on SCr of 1.13 mg/dL).  Recent Results (from the past 240 hour(s))  Surgical PCR screen     Status: None   Collection Time: 10/01/21  2:01 PM   Specimen: Nasal Mucosa; Nasal Swab  Result Value Ref  Range Status   MRSA, PCR NEGATIVE NEGATIVE Final   Staphylococcus aureus NEGATIVE NEGATIVE Final    Comment: (NOTE) The Xpert SA Assay (FDA approved for NASAL specimens in patients 16 years of age and older), is one component of a comprehensive surveillance program. It is not intended to diagnose infection nor to guide or monitor treatment. Performed at [facility], [address], [place], [place] [address]       Radiology Studies: No results found.    LOS: 6 days    Cordelia Poche, MD [facility] 10/06/2021, 7:46 AM   If 7PM-7AM, please contact night-coverage [email or web address]

## 2021-10-06 NOTE — Progress Notes (Signed)
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  Will plan for THA.  All questions answered.  Azucena Cecil, MD [facility] 12:09 PM

## 2021-10-06 NOTE — Op Note (Signed)
TOTAL HIP ARTHROPLASTY ANTERIOR APPROACH  Procedure Note Jackson Martin   [id number]  Pre-op Diagnosis: right hip fracture     Post-op Diagnosis: same  Operative Findings Acute right femoral neck fracture   Operative Procedures  1. Total hip replacement; Right hip; uncemented [id number]   Surgeon: Jackson Martin, M.D.  Assist: Jackson Rob, PA-C   Anesthesia: general  Prosthesis: Depuy Acetabulum: Pinnacle 54 mm Femur: Actis 6 STD Head: 36 mm size: +1.5 Liner: +4 Bearing Type: metal/poly  Total Hip Arthroplasty (Anterior Approach) Op Note:  After informed consent was obtained and the operative extremity marked in the holding area, the patient was brought back to the operating room and placed supine on the HANA table. Next, the operative extremity was prepped and draped in normal sterile fashion. Surgical timeout occurred verifying patient identification, surgical site, surgical procedure and administration of antibiotics.  A modified anterior Smith-Peterson approach to the hip was performed, using the interval between tensor fascia lata and sartorius.  Dissection was carried bluntly down onto the anterior hip capsule. The lateral femoral circumflex vessels were identified and coagulated. A capsulotomy was performed and the capsular flaps tagged for later repair.  Fracture hematoma was evacuated.  The neck osteotomy was performed. The femoral head was removed.the acetabular rim was cleared of soft tissue and attention was turned to reaming the acetabulum.  Sequential reaming was performed under fluoroscopic guidance. We reamed to a size 53 mm, and then impacted the acetabular shell. The liner was then placed after irrigation and attention turned to the femur.  After placing the femoral hook, the leg was taken to externally rotated, extended and adducted position taking care to perform soft tissue releases to allow for adequate mobilization of the femur. Soft tissue was cleared  from the shoulder of the greater trochanter and the hook elevator used to improve exposure of the proximal femur. Sequential broaching performed up to a size 6. Trial neck and head were placed. The leg was brought back up to neutral and the construct reduced.  Antibiotic irrigation was placed in the surgical wound.  The position and sizing of components, offset and leg lengths were checked using fluoroscopy. Stability of the construct was checked in extension and external rotation without any subluxation or impingement of prosthesis. We dislocated the prosthesis, dropped the leg back into position, removed trial components, and irrigated copiously. The final stem and head was then placed, the leg brought back up, the system reduced and fluoroscopy used to verify positioning.  We irrigated, obtained hemostasis and closed the capsule using #2 ethibond suture.  One gram of vancomycin powder was placed in the surgical bed.   One gram of topical tranexamic acid was injected into the joint.  The fascia was closed with #1 vicryl plus, the deep fat layer was closed with 0 vicryl, the subcutaneous layers closed with 2.0 Vicryl Plus and the skin closed with 2.0 nylon and dermabond. A sterile dressing was applied. The patient was awakened in the operating room and taken to recovery in stable condition.  All sponge, needle, and instrument counts were correct at the end of the case.   Jackson Martin, my PA, was a medical necessity for opening, closing, limb positioning, retracting, exposing, and overall facilitation and timely completion of the surgery.  Position: supine  Complications: see description of procedure.  Time Out: performed   Drains/Packing: none  Estimated blood loss: see anesthesia record  Returned to Recovery Room: in good condition.   Antibiotics: yes  Mechanical VTE (DVT) Prophylaxis: sequential compression devices, TED thigh-high  Chemical VTE (DVT) Prophylaxis: resume eliquis POD 1    Fluid Replacement: see anesthesia record  Specimens Removed: 1 to pathology   Sponge and Instrument Count Correct? yes   PACU: portable radiograph - low AP   Plan/RTC: Return in 2 weeks for staple removal. Weight Bearing/Load Lower Extremity: full  Hip precautions: none Suture Removal: 2 weeks   N. Eduard Roux, MD Jackson Martin 2:11 PM   Implant Name Type Inv. Item Serial No. Manufacturer Lot No. LRB No. Used Action  LINER NEUTRAL [id number] PLUS 4 - [id number] Hips LINER NEUTRAL 54X36MM PLUS 4  [employer]PU[employer] [id number] Right 1 Implanted  ACETAB CUP W/GRIPTION 54 - [id number] Plate ACETAB CUP W/GRIPTION 54  [employer] [id number] Right 1 Implanted  SROM M HEAD 36MM PLUS 1.5 - [id number] Hips SROM M HEAD 36MM PLUS 1.5  [employer]PU[employer]TH[employer] [id number] Right 1 Implanted  STEM FEMORAL SZ 6MM STD ACTIS - [id number] Stem STEM FEMORAL SZ 6MM STD ACTIS  [employer] [id number] Right 1 Implanted

## 2021-10-06 NOTE — Progress Notes (Signed)
I have discussed this patient with Dr. Ninfa Linden and I will plan on doing the surgery today.

## 2021-10-07 DIAGNOSIS — I4891 Unspecified atrial fibrillation: Secondary | ICD-10-CM | POA: Diagnosis not present

## 2021-10-07 DIAGNOSIS — F419 Anxiety disorder, unspecified: Secondary | ICD-10-CM | POA: Diagnosis not present

## 2021-10-07 DIAGNOSIS — J449 Chronic obstructive pulmonary disease, unspecified: Secondary | ICD-10-CM | POA: Diagnosis not present

## 2021-10-07 DIAGNOSIS — S72001A Fracture of unspecified part of neck of right femur, initial encounter for closed fracture: Secondary | ICD-10-CM | POA: Diagnosis not present

## 2021-10-07 LAB — CBC
HCT: 36.3 % — ABNORMAL LOW (ref 39.0–52.0)
Hemoglobin: 12.8 g/dL — ABNORMAL LOW (ref 13.0–17.0)
MCH: 32 pg (ref 26.0–34.0)
MCHC: 35.3 g/dL (ref 30.0–36.0)
MCV: 90.8 fL (ref 80.0–100.0)
Platelets: 240 10*3/uL (ref 150–400)
RBC: 4 MIL/uL — ABNORMAL LOW (ref 4.22–5.81)
RDW: 12.6 % (ref 11.5–15.5)
WBC: 9.5 10*3/uL (ref 4.0–10.5)
nRBC: 0 % (ref 0.0–0.2)

## 2021-10-07 LAB — BASIC METABOLIC PANEL
Anion gap: 10 (ref 5–15)
BUN: 24 mg/dL — ABNORMAL HIGH (ref 8–23)
CO2: 24 mmol/L (ref 22–32)
Calcium: 8.9 mg/dL (ref 8.9–10.3)
Chloride: 99 mmol/L (ref 98–111)
Creatinine, Ser: 1.02 mg/dL (ref 0.61–1.24)
GFR, Estimated: >60 mL/min (ref >60)
Glucose, Bld: 141 mg/dL — ABNORMAL HIGH (ref 70–99)
Potassium: 4.4 mmol/L (ref 3.5–5.1)
Sodium: 133 mmol/L — ABNORMAL LOW (ref 135–145)

## 2021-10-07 LAB — GLUCOSE, CAPILLARY
Glucose-Capillary: 109 mg/dL — ABNORMAL HIGH (ref 70–99)
Glucose-Capillary: 104 mg/dL — ABNORMAL HIGH (ref 70–99)

## 2021-10-07 NOTE — Progress Notes (Signed)
   Subjective:  Patient reports pain as mild and controlled with pain medication.  Pending physical therapy consultation and eval.  Tolerating diet. Objective:   VITALS:   Vitals:   10/06/21 1730 10/06/21 1954 10/06/21 2300 10/07/21 0300  BP: 124/85 (!) 140/85 125/83   Pulse: (!) 119 (!) 114 (!) 110 (!) 107  Resp: '16 16 17 15  '$ Temp: 97.9 F (36.6 C) 97.9 F (36.6 C) 98.5 F (36.9 C) 98.3 F (36.8 C)  TempSrc: Oral Oral Oral Oral  SpO2: 97% 98% 95% 96%  Weight:      Height:        Neurologically intact Neurovascular intact Sensation intact distally Intact pulses distally Limb lengths are equal dressings clean dry intact  Lab Results  Component Value Date   WBC 9.5 10/07/2021   HGB 12.8 (L) 10/07/2021   HCT 36.3 (L) 10/07/2021   MCV 90.8 10/07/2021   PLT 240 10/07/2021     Assessment/Plan:  1 Day Post-Op status post right hip total hip arthroplasty for femoral neck fracture doing well  - Expected postop acute blood loss anemia - will monitor for symptoms - Patient to work with PT/OT to optimize mobilization safely - DVT ppx - SCDs, ambulation, okay to resume Eliquis - WBAT operative extremity - Pain control - multimodal pain management, ATC acetaminophen in conjunction with as needed narcotic (oxycodone), although this should be minimized with other modalities  - Discharge planning pending CM, appreciate coordination    Jessey Huyett 10/07/2021, 7:22 AM

## 2021-10-07 NOTE — Progress Notes (Signed)
PROGRESS NOTE    Jackson Martin  SWF:[id number] DOB: [birth year] DOA: 09/30/2021 PCP: Monico Blitz, MD   Brief Narrative: Jackson Martin is a 81 y.o. male with a history of CAD, MI, atrial fibrillation on Eliquis, hypertension, hyperlipidemia, anxiety, prostate cancer. Patient presented secondary to a fall and found to have a right hip fracture. Patient was transferred from [facility] for cardiology evaluation for perioperative risk assessment. Orthopedic surgery consulted for surgical fixation.  Assessment and Plan:  Right hip fracture Secondary to fall. Orthopedic surgery consulted with plan for surgical fixation. Attempt was made on 8/3 for surgical management but was deferred secondary to uncontrolled atrial fibrillation. Patient underwent right total hip arthroplasty on 8/4. -Orthopedic surgery recommendations: WBAT of RLE -PT/OT recommendations  CAD No chest pain currently. Cardiology consulted for perioperative risk assessment and recommended nuclear stress test. Nuclear stress test with non-reversible ischemia.  Paroxysmal atrial fibrillation Patient is managed on Eliquis and diltiazem. Eliquis held on admission. Patient had been rate controlled but developed RVR prior to procedure on 8/3, likely related to not receiving AM dose of diltiazem. He was treated transiently with diltiazem IV infusion which is now discontinued. -Continue diltiazem -Resume Eliquis  COPD -Continue Breo Ellipta and Singulair  Anxiety -Continue home Zoloft and Klonopin  Pressure injury Lower/bilateral perineum. Present on admission.   DVT prophylaxis: SCDs Code Status:   Code Status: Full Code Family Communication: None at bedside Disposition Plan: Discharge pending orthopedic surgery management/recommendations and eventual PT/OT recommendations   Consultants:  Orthopedic surgery Cardiology  Procedures:  Nuclear stress test (8/1)  Antimicrobials: None     Subjective: Patient continues to have pain of his right LE. No other concerns.  Objective: BP 128/72 (BP Location: Right Arm)   Pulse (!) 107   Temp 98.4 F (36.9 C) (Oral)   Resp 18   Ht '5\' 10"'$  (1.778 m)   Wt 68 kg   SpO2 96%   BMI 21.52 kg/m   Examination:  General exam: Appears calm and comfortable Respiratory system: Clear to auscultation. Respiratory effort normal. Cardiovascular system: S1 & S2 heard, irregular rhythm. No murmurs, rubs, gallops or clicks. Gastrointestinal system: Abdomen is nondistended, soft and nontender. No organomegaly or masses felt. Normal bowel sounds heard. Central nervous system: Alert and oriented. No focal neurological deficits. Musculoskeletal: No edema. No calf tenderness Skin: No cyanosis. No rashes. No ecchymosis of right thigh incision sites noted Psychiatry: Judgement and insight appear normal. Mood & affect appropriate.    Data Reviewed: I have personally reviewed following labs and imaging studies  CBC Lab Results  Component Value Date   WBC 9.5 10/07/2021   RBC 4.00 (L) 10/07/2021   HGB 12.8 (L) 10/07/2021   HCT 36.3 (L) 10/07/2021   MCV 90.8 10/07/2021   MCH 32.0 10/07/2021   PLT 240 10/07/2021   MCHC 35.3 10/07/2021   RDW 12.6 10/07/2021   LYMPHSABS 0.4 (L) 09/30/2021   MONOABS 0.8 09/30/2021   EOSABS 0.0 09/30/2021   BASOSABS 0.0 22/04/5425     Last metabolic panel Lab Results  Component Value Date   NA 133 (L) 10/07/2021   K 4.4 10/07/2021   CL 99 10/07/2021   CO2 24 10/07/2021   BUN 24 (H) 10/07/2021   CREATININE 1.02 10/07/2021   GLUCOSE 141 (H) 10/07/2021   GFRNONAA >60 10/07/2021   GFRAA >60 09/02/2019   CALCIUM 8.9 10/07/2021   PHOS 3.0 10/02/2021   PROT 5.7 (L) 10/06/2021   ALBUMIN 3.2 (L) 10/06/2021  BILITOT 1.2 10/06/2021   ALKPHOS 81 10/06/2021   AST 16 10/06/2021   ALT 12 10/06/2021   ANIONGAP 10 10/07/2021    GFR: Estimated Creatinine Clearance: 53.7 mL/min (by C-G formula based on  SCr of 1.02 mg/dL).  Recent Results (from the past 240 hour(s))  Surgical PCR screen     Status: None   Collection Time: 10/01/21  2:01 PM   Specimen: Nasal Mucosa; Nasal Swab  Result Value Ref Range Status   MRSA, PCR NEGATIVE NEGATIVE Final   Staphylococcus aureus NEGATIVE NEGATIVE Final    Comment: (NOTE) The Xpert SA Assay (FDA approved for NASAL specimens in patients 68 years of age and older), is one component of a comprehensive surveillance program. It is not intended to diagnose infection nor to guide or monitor treatment. Performed at [facility], [address] [place], [place] [address]       Radiology Studies: DG Pelvis Portable  Result Date: 10/06/2021 CLINICAL DATA:  [id number] right hip prosthesis EXAM: PORTABLE PELVIS 1-2 VIEWS COMPARISON:  None Available. FINDINGS: Two AP portable supine views of the pelvis were obtained. There are post right hip prosthetic changes seen and the prosthetic components are in near anatomical alignment. Soft tissue emphysema. Mild degenerative changes at the visualized left hip joint. Vascular calcifications. IMPRESSION: Status post right hip prosthesis and the prosthetic components are in near anatomical alignment. Soft tissue emphysema. Electronically Signed   By: Frazier Richards M.D.   On: 10/06/2021 18:03   DG HIP UNILAT WITH PELVIS 1V RIGHT  Result Date: 10/06/2021 CLINICAL DATA:  Intraoperative fluoroscopy for anterior approach total right hip arthroplasty. EXAM: DG HIP (WITH OR WITHOUT PELVIS) 1V RIGHT COMPARISON:  Pelvis and right hip radiographs 09/30/2021 FINDINGS: Images were performed intraoperatively without the presence of a radiologist. Interval total right hip arthroplasty. No evidence of hardware complication. Total fluoroscopy images: 2 Total fluoroscopy time: 23 seconds Total dose: Radiation Exposure Index (as provided by the fluoroscopic device): 0.979 mGy air Kerma Please see intraoperative findings for further detail.  IMPRESSION: Intraoperative fluoroscopy for total right hip arthroplasty. Electronically Signed   By: Yvonne Kendall M.D.   On: 10/06/2021 14:22   DG C-Arm 1-60 Min-No Report  Result Date: 10/06/2021 Fluoroscopy was utilized by the requesting physician.  No radiographic interpretation.      LOS: 7 days    Cordelia Poche, MD [facility] 10/07/2021, 12:35 PM   If 7PM-7AM, please contact night-coverage [email or web address]

## 2021-10-07 NOTE — Evaluation (Signed)
Occupational Therapy Evaluation Patient Details Name: Jackson Martin MRN: [id number] DOB: 1940/06/13 Today's Date: 10/07/2021   History of Present Illness Jackson Martin is a 81 y.o. male presents to the hospital after a fall last night resulting in right femur fracture.  He underwent R THR on 8/4 via Dr. Erlinda Hong.  Medical history significant of coronary artery disease and history of MI, atrial fibrillation rate controlled on Eliquis, hypertension, hyperlipidemia, anxiety, history of prostate cancer.  The patient also has a history of anxiety controlled on sertraline and Klonopin.  .   Clinical Impression   Pt currently presents at a min assist level for simulated toilet transfers and transfers bed to recliner as well as mod assist for LB selfcare sit to stand.  Feel he will benefit from acute care OT to progress back to help progress ADL independence.  Feel he will benefit from follow-up SNF placement for future therapy in order to return home eventually at a modified independent level.        Recommendations for follow up therapy are one component of a multi-disciplinary discharge planning process, led by the attending physician.  Recommendations may be updated based on patient status, additional functional criteria and insurance authorization.   Follow Up Recommendations  Skilled nursing-short term rehab (<3 hours/day)    Assistance Recommended at Discharge Frequent or constant Supervision/Assistance  Patient can return home with the following A little help with walking and/or transfers;A little help with bathing/dressing/bathroom;Assistance with cooking/housework;Assist for transportation;Help with stairs or ramp for entrance    Functional Status Assessment  Patient has had a recent decline in their functional status and demonstrates the ability to make significant improvements in function in a reasonable and predictable amount of time.  Equipment Recommendations  None recommended by  OT;Other (comment) (TBD next venue of care)       Precautions / Restrictions Precautions Precautions: Fall Restrictions Weight Bearing Restrictions: No RLE Weight Bearing: Weight bearing as tolerated      Mobility Bed Mobility Overal bed mobility: Needs Assistance Bed Mobility: Supine to Sit     Supine to sit: Mod assist          Transfers Overall transfer level: Needs assistance Equipment used: Rolling walker (2 wheels) Transfers: Bed to chair/wheelchair/BSC, Sit to/from Stand Sit to Stand: Min assist     Step pivot transfers: Min assist            Balance Overall balance assessment: Needs assistance Sitting-balance support: Feet supported Sitting balance-Leahy Scale: Fair     Standing balance support: Bilateral upper extremity supported, During functional activity, Reliant on assistive device for balance Standing balance-Leahy Scale: Poor Standing balance comment: Pt needs UE support on the RW in standing and with mobility.                           ADL either performed or assessed with clinical judgement   ADL Overall ADL's : Needs assistance/impaired Eating/Feeding: Independent;Sitting   Grooming: Wash/dry hands;Sitting Grooming Details (indicate cue type and reason): simulated Upper Body Bathing: Set up Upper Body Bathing Details (indicate cue type and reason): simulated Lower Body Bathing: Sit to/from stand;Minimal assistance   Upper Body Dressing : Supervision/safety;Sitting   Lower Body Dressing: Moderate assistance;Sit to/from stand   Toilet Transfer: Minimal assistance   Toileting- Clothing Manipulation and Hygiene: Minimal assistance;Sit to/from stand       Functional mobility during ADLs: Minimal assistance;Rolling walker (2 wheels) General ADL Comments: Pt  able to complete sit to stand with min assist using the RW for support.  Reports not having any assist at discharge so will likely have to go to SNF for rehab.      Vision Baseline Vision/History: 0 No visual deficits Ability to See in Adequate Light: 0 Adequate Patient Visual Report: No change from baseline Vision Assessment?: No apparent visual deficits            Pertinent Vitals/Pain Pain Assessment Pain Assessment: 0-10 Pain Location: right leg Pain Descriptors / Indicators: Discomfort Pain Intervention(s): Limited activity within patient's tolerance, Monitored during session     Hand Dominance Right   Extremity/Trunk Assessment Upper Extremity Assessment Upper Extremity Assessment: Overall WFL for tasks assessed   Lower Extremity Assessment Lower Extremity Assessment: Defer to PT evaluation   Cervical / Trunk Assessment Cervical / Trunk Assessment: Normal   Communication Communication Communication: No difficulties   Cognition Arousal/Alertness: Awake/alert Behavior During Therapy: WFL for tasks assessed/performed Overall Cognitive Status: Within Functional Limits for tasks assessed                                                  Home Living Family/patient expects to be discharged to:: Skilled nursing facility Living Arrangements: Alone Available Help at Discharge: [place] Type of Home: House Home Access: Ramped entrance     Home Layout: One level;Other (Comment) (multi level but only uses the first)     Bathroom Shower/Tub: Occupational psychologist: Standard Bathroom Accessibility: Yes              Prior Functioning/Environment Prior Level of Function : Independent/Modified Independent             Mobility Comments: used walker at home ADLs Comments: indepenedent with all bathing and dressing        OT Problem List: Decreased strength;Decreased knowledge of use of DME or AE;Pain;Impaired balance (sitting and/or standing)      OT Treatment/Interventions: Self-care/ADL training;Patient/family education;Balance training;Therapeutic activities;DME  and/or AE instruction    OT Goals(Current goals can be found in the care plan section) Acute Rehab OT Goals Patient Stated Goal: Pt did not state, but agreeable to work with therapy OT Goal Formulation: With patient Time For Goal Achievement: 10/21/21 Potential to Achieve Goals: Good  OT Frequency: Min 2X/week    Co-evaluation PT/OT/SLP Co-Evaluation/Treatment: Yes Reason for Co-Treatment: For patient/therapist safety   OT goals addressed during session: ADL's and self-care      AM-PAC OT "6 Clicks" Daily Activity     Outcome Measure Help from another person eating meals?: None Help from another person taking care of personal grooming?: A Little Help from another person toileting, which includes using toliet, bedpan, or urinal?: A Little Help from another person bathing (including washing, rinsing, drying)?: A Little Help from another person to put on and taking off regular upper body clothing?: A Little Help from another person to put on and taking off regular lower body clothing?: A Lot 6 Click Score: 18   End of Session Equipment Utilized During Treatment: Rolling walker (2 wheels) Nurse Communication: Mobility status  Activity Tolerance: Patient tolerated treatment well Patient left: in chair;with call bell/phone within reach  OT Visit Diagnosis: Unsteadiness on feet (R26.81);Muscle weakness (generalized) (M62.81);Pain Pain - Right/Left: Right Pain - part of body: Hip  Time: 4830-7354 OT Time Calculation (min): 28 min Charges:  OT General Charges $OT Visit: 1 Visit OT Evaluation $OT Eval Moderate Complexity: 1 Mod Brandin Stetzer OTR/L 10/07/2021, 4:17 PM

## 2021-10-07 NOTE — Evaluation (Signed)
Physical Therapy Evaluation Patient Details Name: Jackson Martin MRN: [id number] DOB: 1941/03/04 Today's Date: 10/07/2021  History of Present Illness  81 y/o male presented to [facility] on 09/30/21 after fall at home. Sustained R displaced femoral neck fx. Transferred to [facility]. Surgery delayed due to uncontrolled tachycardia. S/p R THA on 8/4. PMH: CAD, hx of MI, Afib, HTN, hx of prostate cancer, COPD  Clinical Impression  Patient admitted with the above. PTA, patient lives alone and was independent with use of RW. Patient currently presents with weakness, impaired balance, decreased activity tolerance, decreased R hip ROM, and pain. Patient overall requiring minA for OOB mobility with use of RW but limited by pain. Patient will benefit from skilled PT services during acute stay to address listed deficits. Recommend SNF at discharge to maximize functional independence and safety prior to returning home.        Recommendations for follow up therapy are one component of a multi-disciplinary discharge planning process, led by the attending physician.  Recommendations may be updated based on patient status, additional functional criteria and insurance authorization.  Follow Up Recommendations Skilled nursing-short term rehab (<3 hours/day) Can patient physically be transported by private vehicle: Yes    Assistance Recommended at Discharge Frequent or constant Supervision/Assistance  Patient can return home with the following  A little help with walking and/or transfers;A little help with bathing/dressing/bathroom;Assistance with cooking/housework;Assist for transportation;Help with stairs or ramp for entrance    Equipment Recommendations Rolling Aleli Navedo (2 wheels);BSC/3in1  Recommendations for Other Services       Functional Status Assessment Patient has had a recent decline in their functional status and demonstrates the ability to make significant improvements in function in a reasonable and  predictable amount of time.     Precautions / Restrictions Precautions Precautions: Fall Restrictions Weight Bearing Restrictions: Yes RLE Weight Bearing: Weight bearing as tolerated      Mobility  Bed Mobility Overal bed mobility: Needs Assistance Bed Mobility: Supine to Sit     Supine to sit: Mod assist          Transfers Overall transfer level: Needs assistance Equipment used: Rolling Taleigha Pinson (2 wheels) Transfers: Sit to/from Stand Sit to Stand: Min assist           General transfer comment: assist for boost up into standing and cues for hand placement    Ambulation/Gait Ambulation/Gait assistance: Min assist Gait Distance (Feet): 20 Feet Assistive device: Rolling Vincenta Steffey (2 wheels) Gait Pattern/deviations: Step-to pattern, Decreased stride length, Decreased stance time - right Gait velocity: decreased     General Gait Details: assist for balance and RW management during turning  Stairs            Wheelchair Mobility    Modified Rankin (Stroke Patients Only)       Balance Overall balance assessment: Needs assistance Sitting-balance support: Feet supported Sitting balance-Leahy Scale: Fair     Standing balance support: Bilateral upper extremity supported, During functional activity, Reliant on assistive device for balance Standing balance-Leahy Scale: Poor Standing balance comment: Pt needs UE support on the RW in standing and with mobility.                             Pertinent Vitals/Pain Pain Assessment Pain Assessment: Faces Faces Pain Scale: Hurts a little bit Pain Location: right leg Pain Descriptors / Indicators: Discomfort Pain Intervention(s): Monitored during session    Home Living Family/patient expects to be discharged  to:: Skilled nursing facility Living Arrangements: Alone Available Help at Discharge: [place] Type of Home: House Home Access: Ramped entrance       [place]: One level  (multi level but only uses the first)        Prior Function Prior Level of Function : Independent/Modified Independent             Mobility Comments: used Luwanda Starr at home ADLs Comments: indepenedent with all bathing and dressing     Hand Dominance   Dominant Hand: Right    Extremity/Trunk Assessment   Upper Extremity Assessment Upper Extremity Assessment: Defer to OT evaluation    Lower Extremity Assessment Lower Extremity Assessment: RLE deficits/detail RLE Deficits / Details: s/p R THA    Cervical / Trunk Assessment Cervical / Trunk Assessment: Normal  Communication   Communication: No difficulties  Cognition Arousal/Alertness: Awake/alert Behavior During Therapy: WFL for tasks assessed/performed Overall Cognitive Status: Within Functional Limits for tasks assessed                                          General Comments      Exercises     Assessment/Plan    PT Assessment Patient needs continued PT services  PT Problem List Decreased range of motion;Decreased strength;Decreased activity tolerance;Decreased balance;Decreased mobility;Decreased safety awareness;Pain       PT Treatment Interventions DME instruction;Gait training;Functional mobility training;Therapeutic activities;Balance training;Therapeutic exercise;Patient/family education    PT Goals (Current goals can be found in the Care Plan section)  Acute Rehab PT Goals Patient Stated Goal: to get better PT Goal Formulation: With patient Time For Goal Achievement: 10/21/21 Potential to Achieve Goals: Good    Frequency Min 3X/week     Co-evaluation   Reason for Co-Treatment: For patient/therapist safety   OT goals addressed during session: ADL's and self-care       AM-PAC PT "6 Clicks" Mobility  Outcome Measure Help needed turning from your back to your side while in a flat bed without using bedrails?: A Little Help needed moving from lying on your back to sitting on  the side of a flat bed without using bedrails?: A Lot Help needed moving to and from a bed to a chair (including a wheelchair)?: A Little Help needed standing up from a chair using your arms (e.g., wheelchair or bedside chair)?: A Little Help needed to walk in hospital room?: A Little Help needed climbing 3-5 steps with a railing? : A Lot 6 Click Score: 16    End of Session   Activity Tolerance: Patient tolerated treatment well Patient left: in chair;with call bell/phone within reach;with chair alarm set Nurse Communication: Mobility status PT Visit Diagnosis: Unsteadiness on feet (R26.81);Muscle weakness (generalized) (M62.81);History of falling (Z91.81);Difficulty in walking, not elsewhere classified (R26.2);Pain Pain - Right/Left: Right Pain - part of body: Hip    Time: 4081-4481 PT Time Calculation (min) (ACUTE ONLY): 27 min   Charges:   PT Evaluation $PT Eval Low Complexity: 1 Low          Tonnette Zwiebel A. Gilford Rile PT, DPT Acute Rehabilitation Services Office [phone]   Linna Hoff 10/07/2021, 4:46 PM

## 2021-10-08 DIAGNOSIS — F419 Anxiety disorder, unspecified: Secondary | ICD-10-CM | POA: Diagnosis not present

## 2021-10-08 DIAGNOSIS — J449 Chronic obstructive pulmonary disease, unspecified: Secondary | ICD-10-CM | POA: Diagnosis not present

## 2021-10-08 DIAGNOSIS — I4891 Unspecified atrial fibrillation: Secondary | ICD-10-CM | POA: Diagnosis not present

## 2021-10-08 DIAGNOSIS — S72001A Fracture of unspecified part of neck of right femur, initial encounter for closed fracture: Secondary | ICD-10-CM | POA: Diagnosis not present

## 2021-10-08 LAB — GLUCOSE, CAPILLARY
Glucose-Capillary: 106 mg/dL — ABNORMAL HIGH (ref 70–99)
Glucose-Capillary: 129 mg/dL — ABNORMAL HIGH (ref 70–99)

## 2021-10-08 LAB — BASIC METABOLIC PANEL
Anion gap: 7 (ref 5–15)
BUN: 21 mg/dL (ref 8–23)
CO2: 30 mmol/L (ref 22–32)
Calcium: 8.9 mg/dL (ref 8.9–10.3)
Chloride: 100 mmol/L (ref 98–111)
Creatinine, Ser: 1.14 mg/dL (ref 0.61–1.24)
GFR, Estimated: >60 mL/min (ref >60)
Glucose, Bld: 102 mg/dL — ABNORMAL HIGH (ref 70–99)
Potassium: 4.2 mmol/L (ref 3.5–5.1)
Sodium: 137 mmol/L (ref 135–145)

## 2021-10-08 LAB — CBC
HCT: 36.2 % — ABNORMAL LOW (ref 39.0–52.0)
Hemoglobin: 12.4 g/dL — ABNORMAL LOW (ref 13.0–17.0)
MCH: 31.3 pg (ref 26.0–34.0)
MCHC: 34.3 g/dL (ref 30.0–36.0)
MCV: 91.4 fL (ref 80.0–100.0)
Platelets: 231 10*3/uL (ref 150–400)
RBC: 3.96 MIL/uL — ABNORMAL LOW (ref 4.22–5.81)
RDW: 12.7 % (ref 11.5–15.5)
WBC: 7.9 10*3/uL (ref 4.0–10.5)
nRBC: 0 % (ref 0.0–0.2)

## 2021-10-08 NOTE — Progress Notes (Signed)
   Subjective:  Patient reports pain as mild and controlled with pain medication.  Worked with physical therapy recommend skilled nursing home.   Objective:   VITALS:   Vitals:   10/08/21 0457 10/08/21 0510 10/08/21 0540 10/08/21 0610  BP: 104/75 105/74 117/78 132/83  Pulse: (!) 104 (!) 121 (!) 108 (!) 114  Resp: 16 (!) 21 15 (!) 29  Temp: 98.1 F (36.7 C)     TempSrc: Oral     SpO2: 92% 94% (!) 89% 94%  Weight:      Height:        Neurologically intact Neurovascular intact Sensation intact distally Intact pulses distally Limb lengths are equal dressings clean dry intact  Lab Results  Component Value Date   WBC 7.9 10/08/2021   HGB 12.4 (L) 10/08/2021   HCT 36.2 (L) 10/08/2021   MCV 91.4 10/08/2021   PLT 231 10/08/2021     Assessment/Plan:  2 Days Post-Op status post right hip total hip arthroplasty for femoral neck fracture doing well  - Expected postop acute blood loss anemia - will monitor for symptoms - Patient to work with PT to optimize mobilization safely - DVT ppx - SCDs, ambulation, okay to resume Eliquis - WBAT operative extremity - Pain control - multimodal pain management, ATC acetaminophen in conjunction with as needed narcotic (oxycodone), although this should be minimized with other modalities  - Discharge planning pending CM, appreciate coordination    Kais Monje 10/08/2021, 7:44 AM

## 2021-10-08 NOTE — Progress Notes (Signed)
Mobility Specialist Progress Note   10/08/21 1509  Therapy Vitals  BP 118/64  Oxygen Therapy  O2 Device Room Air  Mobility  Activity Ambulated with assistance in room  Level of Assistance Minimal assist, patient does 75% or more  Assistive Device Front wheel walker  RLE Weight Bearing WBAT  Distance Ambulated (ft) 18 ft  Activity Response Tolerated fair  $Mobility charge 1 Mobility   Pre Mobility: 91 HR, 95% SpO2 During Mobility: 126 HR, 85% SpO2 Post Mobility: 93 HR, BP, 94% SpO2  Received pt in bed c/o R hip soreness(8/10) but agreeable. Session limited today by dec. pain tolerance + fatigue, pt requesting to get back in bed after short bout in room. Left supine in bed w/ call bell in reach, bed alarm on and RN notified.  Holland Falling Mobility Specialist MS [facility] #:  [phone] Acute Rehab Office:  [phone]

## 2021-10-08 NOTE — Progress Notes (Signed)
PROGRESS NOTE    [name]  MOQ:[id number] DOB: 08/20/1940 DOA: 09/30/2021 PCP: Monico Blitz, MD   Brief Narrative: Jackson Martin is a 81 y.o. male with a history of CAD, MI, atrial fibrillation on Eliquis, hypertension, hyperlipidemia, anxiety, prostate cancer. Patient presented secondary to a fall and found to have a right hip fracture. Patient was transferred from [facility] for cardiology evaluation for perioperative risk assessment. Orthopedic surgery consulted for surgical fixation.  Assessment and Plan:  Right hip fracture Secondary to fall. Orthopedic surgery consulted with plan for surgical fixation. Attempt was made on 8/3 for surgical management but was deferred secondary to uncontrolled atrial fibrillation. Patient underwent right total hip arthroplasty on 8/4. PT/OT recommending SNF. -Orthopedic surgery recommendations: WBAT of RLE  CAD No chest pain currently. Cardiology consulted for perioperative risk assessment and recommended nuclear stress test. Nuclear stress test with non-reversible ischemia.  Paroxysmal atrial fibrillation Patient is managed on Eliquis and diltiazem. Eliquis held on admission. Patient had been rate controlled but developed RVR prior to procedure on 8/3, likely related to not receiving AM dose of diltiazem. He was treated transiently with diltiazem IV infusion which is now discontinued. -Continue diltiazem -Continue Eliquis  COPD -Continue Breo Ellipta and Singulair  Anxiety -Continue home Zoloft and Klonopin  Pressure injury Lower/bilateral perineum. Present on admission.   DVT prophylaxis: SCDs Code Status:   Code Status: Full Code Family Communication: None at bedside Disposition Plan: Discharge pending bed availability. Medically stable for discharge.   Consultants:  Orthopedic surgery Cardiology  Procedures:  Nuclear stress test (8/1)  Antimicrobials: None    Subjective: Some right thigh pain. No  other concerns this morning.  Objective: BP 102/76   Pulse (!) 114   Temp 98 F (36.7 C) (Oral)   Resp 18   Ht '5\' 10"'$  (1.778 m)   Wt 68 kg   SpO2 97%   BMI 21.52 kg/m   Examination:  General exam: Appears calm and comfortable Respiratory system: Clear to auscultation. Respiratory effort normal. Cardiovascular system: S1 & S2 heard, irregular rhythm. No murmurs, rubs, gallops or clicks. Gastrointestinal system: Abdomen is nondistended, soft and nontender. Normal bowel sounds heard. Central nervous system: Alert and oriented. No focal neurological deficits. Psychiatry: Judgement and insight appear normal. Mood & affect appropriate.    Data Reviewed: I have personally reviewed following labs and imaging studies  CBC Lab Results  Component Value Date   WBC 7.9 10/08/2021   RBC 3.96 (L) 10/08/2021   HGB 12.4 (L) 10/08/2021   HCT 36.2 (L) 10/08/2021   MCV 91.4 10/08/2021   MCH 31.3 10/08/2021   PLT 231 10/08/2021   MCHC 34.3 10/08/2021   RDW 12.7 10/08/2021   LYMPHSABS 0.4 (L) 09/30/2021   MONOABS 0.8 09/30/2021   EOSABS 0.0 09/30/2021   BASOSABS 0.0 35/46/5681     Last metabolic panel Lab Results  Component Value Date   NA 137 10/08/2021   K 4.2 10/08/2021   CL 100 10/08/2021   CO2 30 10/08/2021   BUN 21 10/08/2021   CREATININE 1.14 10/08/2021   GLUCOSE 102 (H) 10/08/2021   GFRNONAA >60 10/08/2021   GFRAA >60 09/02/2019   CALCIUM 8.9 10/08/2021   PHOS 3.0 10/02/2021   PROT 5.7 (L) 10/06/2021   ALBUMIN 3.2 (L) 10/06/2021   BILITOT 1.2 10/06/2021   ALKPHOS 81 10/06/2021   AST 16 10/06/2021   ALT 12 10/06/2021   ANIONGAP 7 10/08/2021    GFR: Estimated Creatinine Clearance: 47.2  mL/min (by C-G formula based on SCr of 1.14 mg/dL).  Recent Results (from the past 240 hour(s))  Surgical PCR screen     Status: None   Collection Time: 10/01/21  2:01 PM   Specimen: Nasal Mucosa; Nasal Swab  Result Value Ref Range Status   MRSA, PCR NEGATIVE NEGATIVE Final    Staphylococcus aureus NEGATIVE NEGATIVE Final    Comment: (NOTE) The Xpert SA Assay (FDA approved for NASAL specimens in patients 3 years of age and older), is one component of a comprehensive surveillance program. It is not intended to diagnose infection nor to guide or monitor treatment. Performed at [facility] [address] [place], [place] [address]       Radiology Studies: DG Pelvis Portable  Result Date: 10/06/2021 CLINICAL DATA:  [id number] right hip prosthesis EXAM: PORTABLE PELVIS 1-2 VIEWS COMPARISON:  None Available. FINDINGS: Two AP portable supine views of the pelvis were obtained. There are post right hip prosthetic changes seen and the prosthetic components are in near anatomical alignment. Soft tissue emphysema. Mild degenerative changes at the visualized left hip joint. Vascular calcifications. IMPRESSION: Status post right hip prosthesis and the prosthetic components are in near anatomical alignment. Soft tissue emphysema. Electronically Signed   By: Frazier Richards M.D.   On: 10/06/2021 18:03   DG HIP UNILAT WITH PELVIS 1V RIGHT  Result Date: 10/06/2021 CLINICAL DATA:  Intraoperative fluoroscopy for anterior approach total right hip arthroplasty. EXAM: DG HIP (WITH OR WITHOUT PELVIS) 1V RIGHT COMPARISON:  Pelvis and right hip radiographs 09/30/2021 FINDINGS: Images were performed intraoperatively without the presence of a radiologist. Interval total right hip arthroplasty. No evidence of hardware complication. Total fluoroscopy images: 2 Total fluoroscopy time: 23 seconds Total dose: Radiation Exposure Index (as provided by the fluoroscopic device): 0.979 mGy air Kerma Please see intraoperative findings for further detail. IMPRESSION: Intraoperative fluoroscopy for total right hip arthroplasty. Electronically Signed   By: Yvonne Kendall M.D.   On: 10/06/2021 14:22   DG C-Arm 1-60 Min-No Report  Result Date: 10/06/2021 Fluoroscopy was utilized by the requesting physician.  No  radiographic interpretation.      LOS: 8 days    Cordelia Poche, MD [facility] 10/08/2021, 11:04 AM   If 7PM-7AM, please contact night-coverage [email or web address]

## 2021-10-08 NOTE — Progress Notes (Addendum)
Un witnessed fall at bedside-pt states he slid down to floor and butt hit first- he was not sure as to why he tried to get OOB in the first place-mats down- but alarm not activated-found around 0040-on call notified at 0058- no c/o anything hurting- no acute injuries- iv out at some point. Vs stable-pt orientated and answering questions appropriately post fall.

## 2021-10-08 NOTE — Progress Notes (Signed)
   10/08/21 0310  Assess: MEWS Score  BP 133/73  MAP (mmHg) 91  Pulse Rate (!) 104  ECG Heart Rate (!) 110  Resp 10  SpO2 94 %  Assess: MEWS Score  MEWS Temp 0  MEWS Systolic 0  MEWS Pulse 1  MEWS RR 1  MEWS LOC 0  MEWS Score 2  MEWS Score Color Yellow  Assess: if the MEWS score is Yellow or Red  Were vital signs taken at a resting state? Yes  Focused Assessment No change from prior assessment  Does the patient meet 2 or more of the SIRS criteria? No  Does the patient have a confirmed or suspected source of infection? No  MEWS guidelines implemented *See Row Information* No, previously yellow, continue vital signs every 4 hours  Treat  MEWS Interventions Administered scheduled meds/treatments  Pain Scale 0-10  Pain Score Asleep  Take Vital Signs  Increase Vital Sign Frequency  Yellow: Q 2hr X 2 then Q 4hr X 2, if remains yellow, continue Q 4hrs  Document  Progress note created (see row info) Yes  Assess: SIRS CRITERIA  SIRS Temperature  0  SIRS Pulse 1  SIRS Respirations  0  SIRS WBC 1  SIRS Score Sum  2

## 2021-10-09 ENCOUNTER — Encounter (HOSPITAL_COMMUNITY): Payer: Self-pay | Admitting: Orthopaedic Surgery

## 2021-10-09 DIAGNOSIS — J449 Chronic obstructive pulmonary disease, unspecified: Secondary | ICD-10-CM | POA: Diagnosis not present

## 2021-10-09 DIAGNOSIS — I4811 Longstanding persistent atrial fibrillation: Secondary | ICD-10-CM | POA: Diagnosis not present

## 2021-10-09 DIAGNOSIS — I2510 Atherosclerotic heart disease of native coronary artery without angina pectoris: Secondary | ICD-10-CM | POA: Diagnosis not present

## 2021-10-09 DIAGNOSIS — D62 Acute posthemorrhagic anemia: Secondary | ICD-10-CM

## 2021-10-09 DIAGNOSIS — K219 Gastro-esophageal reflux disease without esophagitis: Secondary | ICD-10-CM | POA: Diagnosis not present

## 2021-10-09 DIAGNOSIS — I10 Essential (primary) hypertension: Secondary | ICD-10-CM | POA: Diagnosis not present

## 2021-10-09 DIAGNOSIS — Z7401 Bed confinement status: Secondary | ICD-10-CM | POA: Diagnosis not present

## 2021-10-09 DIAGNOSIS — I4821 Permanent atrial fibrillation: Secondary | ICD-10-CM

## 2021-10-09 DIAGNOSIS — I252 Old myocardial infarction: Secondary | ICD-10-CM | POA: Diagnosis not present

## 2021-10-09 DIAGNOSIS — M1A00X0 Idiopathic chronic gout, unspecified site, without tophus (tophi): Secondary | ICD-10-CM | POA: Diagnosis not present

## 2021-10-09 DIAGNOSIS — D649 Anemia, unspecified: Secondary | ICD-10-CM | POA: Diagnosis not present

## 2021-10-09 DIAGNOSIS — R531 Weakness: Secondary | ICD-10-CM | POA: Diagnosis not present

## 2021-10-09 DIAGNOSIS — E440 Moderate protein-calorie malnutrition: Secondary | ICD-10-CM | POA: Diagnosis not present

## 2021-10-09 DIAGNOSIS — E785 Hyperlipidemia, unspecified: Secondary | ICD-10-CM | POA: Diagnosis not present

## 2021-10-09 DIAGNOSIS — F339 Major depressive disorder, recurrent, unspecified: Secondary | ICD-10-CM | POA: Diagnosis not present

## 2021-10-09 DIAGNOSIS — T07XXXA Unspecified multiple injuries, initial encounter: Secondary | ICD-10-CM | POA: Diagnosis not present

## 2021-10-09 DIAGNOSIS — K5909 Other constipation: Secondary | ICD-10-CM | POA: Diagnosis not present

## 2021-10-09 DIAGNOSIS — I700 Atherosclerosis of aorta: Secondary | ICD-10-CM | POA: Diagnosis not present

## 2021-10-09 DIAGNOSIS — S72031D Displaced midcervical fracture of right femur, subsequent encounter for closed fracture with routine healing: Secondary | ICD-10-CM | POA: Diagnosis not present

## 2021-10-09 DIAGNOSIS — I248 Other forms of acute ischemic heart disease: Secondary | ICD-10-CM | POA: Diagnosis not present

## 2021-10-09 DIAGNOSIS — S72001A Fracture of unspecified part of neck of right femur, initial encounter for closed fracture: Secondary | ICD-10-CM | POA: Diagnosis not present

## 2021-10-09 DIAGNOSIS — I219 Acute myocardial infarction, unspecified: Secondary | ICD-10-CM | POA: Diagnosis not present

## 2021-10-09 LAB — CBC
HCT: 36.9 % — ABNORMAL LOW (ref 39.0–52.0)
Hemoglobin: 12.8 g/dL — ABNORMAL LOW (ref 13.0–17.0)
MCH: 31.9 pg (ref 26.0–34.0)
MCHC: 34.7 g/dL (ref 30.0–36.0)
MCV: 92 fL (ref 80.0–100.0)
Platelets: 240 10*3/uL (ref 150–400)
RBC: 4.01 MIL/uL — ABNORMAL LOW (ref 4.22–5.81)
RDW: 12.9 % (ref 11.5–15.5)
WBC: 5.8 10*3/uL (ref 4.0–10.5)
nRBC: 0 % (ref 0.0–0.2)

## 2021-10-09 LAB — GLUCOSE, CAPILLARY: Glucose-Capillary: 106 mg/dL — ABNORMAL HIGH (ref 70–99)

## 2021-10-09 MED ORDER — CLONAZEPAM 0.5 MG PO TABS: 0.5000 mg | ORAL_TABLET | Freq: Three times a day (TID) | ORAL | 0 refills | Status: DC

## 2021-10-09 MED ORDER — BUDESONIDE-FORMOTEROL FUMARATE 80-4.5 MCG/ACT IN AERO: 2.0000 | INHALATION_SPRAY | Freq: Two times a day (BID) | RESPIRATORY_TRACT | 11 refills | Status: DC

## 2021-10-09 MED ORDER — POLYETHYLENE GLYCOL 3350 17 G PO PACK: 17.0000 g | PACK | Freq: Every day | ORAL | Status: DC | PRN

## 2021-10-09 MED ORDER — DOCUSATE SODIUM 100 MG PO CAPS: 100.0000 mg | ORAL_CAPSULE | Freq: Two times a day (BID) | ORAL | 0 refills | Status: DC

## 2021-10-09 NOTE — NC FL2 (Signed)
[place] LEVEL OF CARE SCREENING TOOL     IDENTIFICATION  Patient Name: Jackson Martin Birthdate: 12/25/1940 Sex: male Admission Date (Current Location): 09/30/2021  [facility] and [place] Number:  Herbalist and Address:  [facility] [place] [place] [address] [place], [place] [address]      Provider Number: [id number]  Attending Physician Name and Address:  Mariel Aloe, MD  Relative Name and Phone Number:  Lionel December   [phone]    Current Level of Care: Hospital Recommended Level of Care: [place] Prior Approval Number:    Date Approved/Denied:   PASRR Number: [id number]  Discharge Plan: SNF    Current Diagnoses: Patient Active Problem List   Diagnosis Date Noted   Displaced fracture of right femoral neck ([place]) 09/30/2021   Anxiety 09/30/2021   Atrial fibrillation ([place]) 09/30/2021   Essential hypertension 09/30/2021   Hyperlipidemia 09/30/2021   Elevated alkaline phosphatase level 01/31/2021   Constipation 05/31/2020   Ulcerated external hemorrhoids 05/31/2020   Diverticulitis 09/02/2019   Blood in stool 09/23/2017   Dyspnea on exertion 12/25/2016   COPD  GOLD 0/ ? AB 12/25/2016   Gastrointestinal hemorrhage 10/29/2016   History of MI (myocardial infarction) 2003    Orientation RESPIRATION BLADDER Height & Weight     Self, Time, Situation, Place  Normal Incontinent, External catheter Weight: 150 lb (68 kg) Height:  '5\' 10"'$  (177.8 cm)  BEHAVIORAL SYMPTOMS/MOOD NEUROLOGICAL BOWEL NUTRITION STATUS      Continent Diet (see discharge summary)  AMBULATORY STATUS COMMUNICATION OF NEEDS Skin   Limited Assist Verbally Surgical wounds                       Personal Care Assistance Level of Assistance  Bathing, Feeding, Dressing Bathing Assistance: Limited assistance Feeding assistance: Independent Dressing Assistance: Limited assistance     Functional Limitations Info  Sight,  Hearing, Speech Sight Info: Adequate Hearing Info: Adequate Speech Info: Adequate    SPECIAL CARE FACTORS FREQUENCY  PT (By licensed PT), OT (By licensed OT)     PT Frequency: 5x week OT Frequency: 5x week            Contractures Contractures Info: Not present    Additional Factors Info  Code Status, Allergies Code Status Info: full Allergies Info: NKA           Current Medications (10/09/2021):  This is the current hospital active medication list Current Facility-Administered Medications  Medication Dose Route Frequency Provider Last Rate Last Admin   0.9 %  sodium chloride infusion   Intravenous Continuous Leandrew Koyanagi, MD 75 mL/hr at 10/07/21 1748 Infusion Verify at 10/07/21 1748   acetaminophen (TYLENOL) tablet 650 mg  650 mg Oral Q6H PRN Leandrew Koyanagi, MD   650 mg at 10/08/21 1724   Or   acetaminophen (TYLENOL) suppository 650 mg  650 mg Rectal Q6H PRN Leandrew Koyanagi, MD       acetaminophen (TYLENOL) tablet 325-650 mg  325-650 mg Oral Q6H PRN Leandrew Koyanagi, MD       alum & mag hydroxide-simeth (MAALOX/MYLANTA) 200-200-20 MG/5ML suspension 30 mL  30 mL Oral Q4H PRN Leandrew Koyanagi, MD       apixaban Arne Cleveland) tablet 5 mg  5 mg Oral BID Leandrew Koyanagi, MD   5 mg at 10/09/21 0810   clonazePAM (KLONOPIN) tablet 0.5 mg  0.5 mg Oral BID Leandrew Koyanagi, MD  0.5 mg at 10/09/21 0810   diltiazem (CARDIZEM CD) 24 hr capsule 240 mg  240 mg Oral Daily Leandrew Koyanagi, MD   240 mg at 10/09/21 0809   docusate sodium (COLACE) capsule 100 mg  100 mg Oral BID Leandrew Koyanagi, MD   100 mg at 10/09/21 0810   fentaNYL (SUBLIMAZE) injection 50 mcg  50 mcg Intravenous Q2H PRN Leandrew Koyanagi, MD   50 mcg at 10/03/21 0536   fluticasone furoate-vilanterol (BREO ELLIPTA) 100-25 MCG/ACT 1 puff  1 puff Inhalation Daily Leandrew Koyanagi, MD   1 puff at 10/09/21 0757   HYDROmorphone (DILAUDID) injection 0.5-1 mg  0.5-1 mg Intravenous Q4H PRN Leandrew Koyanagi, MD   1 mg at 10/08/21 2016   menthol-cetylpyridinium  (CEPACOL) lozenge 3 mg  1 lozenge Oral PRN Leandrew Koyanagi, MD       Or   phenol (CHLORASEPTIC) mouth spray 1 spray  1 spray Mouth/Throat PRN Leandrew Koyanagi, MD       methocarbamol (ROBAXIN) tablet 500 mg  500 mg Oral Q6H PRN Leandrew Koyanagi, MD       Or   methocarbamol (ROBAXIN) 500 mg in dextrose 5 % 50 mL IVPB  500 mg Intravenous Q6H PRN Leandrew Koyanagi, MD       methocarbamol (ROBAXIN) tablet 750 mg  750 mg Oral TID Leandrew Koyanagi, MD   750 mg at 10/09/21 0810   montelukast (SINGULAIR) tablet 10 mg  10 mg Oral QHS Leandrew Koyanagi, MD   10 mg at 10/08/21 2124   ondansetron (ZOFRAN) tablet 4 mg  4 mg Oral Q6H PRN Leandrew Koyanagi, MD       Or   ondansetron [facility]) injection 4 mg  4 mg Intravenous Q6H PRN Leandrew Koyanagi, MD       ondansetron [facility]) tablet 4 mg  4 mg Oral Q6H PRN Leandrew Koyanagi, MD       Or   ondansetron [facility]) injection 4 mg  4 mg Intravenous Q6H PRN Leandrew Koyanagi, MD       oxyCODONE (Oxy IR/ROXICODONE) immediate release tablet 10-15 mg  10-15 mg Oral Q4H PRN Leandrew Koyanagi, MD       oxyCODONE (Oxy IR/ROXICODONE) immediate release tablet 5-10 mg  5-10 mg Oral Q4H PRN Leandrew Koyanagi, MD   10 mg at 10/09/21 0727   polyethylene glycol (MIRALAX / GLYCOLAX) packet 17 g  17 g Oral Daily Leandrew Koyanagi, MD   17 g at 10/09/21 0809   polyethylene glycol (MIRALAX / GLYCOLAX) packet 17 g  17 g Oral Daily PRN Leandrew Koyanagi, MD       senna-docusate (Senokot-S) tablet 2 tablet  2 tablet Oral QHS Leandrew Koyanagi, MD   2 tablet at 10/08/21 2125   sertraline (ZOLOFT) tablet 100 mg  100 mg Oral Daily Leandrew Koyanagi, MD   100 mg at 10/09/21 0810   sorbitol 70 % solution 30 mL  30 mL Oral Daily PRN Leandrew Koyanagi, MD         Discharge Medications: Please see discharge summary for a list of discharge medications.  Relevant Imaging Results:  Relevant Lab Results:   Additional Information SS# [id number], pt reports he is vaccinated for covid with multiple boosters  Daniah Zaldivar, Veronia Beets, LCSW

## 2021-10-09 NOTE — Progress Notes (Signed)
Physical Therapy Treatment Patient Details Name: Jackson Martin MRN: [id number] DOB: 02/18/1941 Today's Date: 10/09/2021   History of Present Illness 81 y/o male presented to [facility] on 09/30/21 after fall at home. Sustained R displaced femoral neck fx. Transferred to [facility]. Surgery delayed due to uncontrolled tachycardia. S/p R THA on 8/4. PMH: CAD, hx of MI, Afib, HTN, hx of prostate cancer, COPD    PT Comments    Patient continues to require assistance with functional mobility efforts. Heart rate increased to 140 with exertional activity. Patient fatigued with activity. Recommend to continue PT to maximize independence and facilitate return to prior level of function. SNF recommended at discharge.    Recommendations for follow up therapy are one component of a multi-disciplinary discharge planning process, led by the attending physician.  Recommendations may be updated based on patient status, additional functional criteria and insurance authorization.  Follow Up Recommendations  Skilled nursing-short term rehab (<3 hours/day) Can patient physically be transported by private vehicle: Yes   Assistance Recommended at Discharge Frequent or constant Supervision/Assistance  Patient can return home with the following A little help with walking and/or transfers;A little help with bathing/dressing/bathroom;Assistance with cooking/housework;Assist for transportation;Help with stairs or ramp for entrance   Equipment Recommendations  Rolling walker (2 wheels);BSC/3in1    Recommendations for Other Services       Precautions / Restrictions Precautions Precautions: Fall Restrictions Weight Bearing Restrictions: Yes RLE Weight Bearing: Weight bearing as tolerated     Mobility  Bed Mobility Overal bed mobility: Needs Assistance Bed Mobility: Supine to Sit, Sit to Supine     Supine to sit: Mod assist Sit to supine: Min assist   General bed mobility comments: assistance for RLE and trunk  support to sit upright with verbal cues for sequencing and technique. assistance for RLE support to return to bed. increased time and effort required    Transfers Overall transfer level: Needs assistance Equipment used: Rolling walker (2 wheels) Transfers: Sit to/from Stand Sit to Stand: Min assist, From elevated surface           General transfer comment: verbal cues for hand placement for safety with standing. lifting assistance required to stand    Ambulation/Gait               General Gait Details: deferred as heart rate up to 140 with exertional activity. no dizziness or other symptoms reported while standing. he also declined getting up to the chair and requested to return to bed   Stairs             Wheelchair Mobility    Modified Rankin (Stroke Patients Only)       Balance Overall balance assessment: Needs assistance Sitting-balance support: Feet supported Sitting balance-Leahy Scale: Fair Sitting balance - Comments: no external support required                                    Cognition Arousal/Alertness: Awake/alert Behavior During Therapy: WFL for tasks assessed/performed Overall Cognitive Status: Within Functional Limits for tasks assessed                                          Exercises      General Comments        Pertinent Vitals/Pain Pain Assessment Pain Assessment: Faces Faces  Pain Scale: Hurts a little bit Pain Location: right leg Pain Descriptors / Indicators: Discomfort Pain Intervention(s): Limited activity within patient's tolerance, Monitored during session    Home Living                          Prior Function            PT Goals (current goals can now be found in the care plan section) Acute Rehab PT Goals Patient Stated Goal: to get better PT Goal Formulation: With patient Time For Goal Achievement: 10/21/21 Potential to Achieve Goals: Good Progress towards PT  goals: Progressing toward goals    Frequency    Min 3X/week      PT Plan Current plan remains appropriate    Co-evaluation              AM-PAC PT "6 Clicks" Mobility   Outcome Measure  Help needed turning from your back to your side while in a flat bed without using bedrails?: A Little Help needed moving from lying on your back to sitting on the side of a flat bed without using bedrails?: A Lot Help needed moving to and from a bed to a chair (including a wheelchair)?: A Little Help needed standing up from a chair using your arms (e.g., wheelchair or bedside chair)?: A Little Help needed to walk in hospital room?: A Little Help needed climbing 3-5 steps with a railing? : A Lot 6 Click Score: 16    End of Session   Activity Tolerance: Patient tolerated treatment well Patient left: in bed;with call bell/phone within reach;with bed alarm set   PT Visit Diagnosis: Unsteadiness on feet (R26.81);Muscle weakness (generalized) (M62.81);History of falling (Z91.81);Difficulty in walking, not elsewhere classified (R26.2);Pain Pain - Right/Left: Right Pain - part of body: Hip     Time: 3818-2993 PT Time Calculation (min) (ACUTE ONLY): 17 min  Charges:  $Therapeutic Activity: 8-22 mins                    Minna Merritts, PT, MPT    Percell Locus 10/09/2021, 11:44 AM

## 2021-10-09 NOTE — TOC Transition Note (Signed)
Transition of Care [facility]) - CM/SW Discharge Note   Patient Details  Name: Jackson Martin MRN: [id number] Date of Birth: 1940-05-05  [facility] of [facility] CM/SW Contact:  Joanne Chars, LCSW Phone Number: 10/09/2021, 2:20 PM   Clinical Narrative:   Pt discharging to [facility].  RN call [phone] for report.    CSW spoke with pt regarding family providing transport.  Pt attempted to call several people, said he was not able to find anyone and would use ambulance transport.  CSW then spoke with nephew Jackson Martin, who said pt had just called him about transport and he was not comfortable transporting pt with a broken hip.  Jackson Martin will be bringing him clothing at [facility].    Final next level of care: Skilled Nursing Facility Barriers to Discharge: Barriers Resolved   Patient Goals and CMS Choice Patient states their goals for this hospitalization and ongoing recovery are:: to get better.      Discharge Placement              Patient chooses bed at:  [facility]) Patient to be transferred to facility by: [place] Name of family member notified: nephew Jackson Martin Patient and family notified of of transfer: 10/09/21  Discharge Plan and Services In-house Referral: Clinical Social Work   Post Acute Care Choice: [place]                               Social Determinants of Health (SDOH) Interventions     Readmission Risk Interventions    10/02/2021   12:58 PM  Readmission Risk Prevention Plan  Post Dischage Appt Not Complete  Medication Screening Complete  Transportation Screening Complete

## 2021-10-09 NOTE — Discharge Summary (Signed)
Physician Discharge Summary   Patient: Jackson Martin MRN: [id number] DOB: [birth year]  Admit date:     09/30/2021  Discharge date: 10/09/21  Discharge Physician: Cordelia Poche, MD   PCP: Monico Blitz, MD   Recommendations at discharge:  PCP and orthopedic surgery follow-up Weight bearing as tolerated Chronically prescribed Eliquis for DVT prophylaxis  Discharge Diagnoses: Principal Problem:   Displaced fracture of right femoral neck ([place]) Active Problems:   COPD  GOLD 0/ ? AB   Anxiety   Atrial fibrillation ([place])   Essential hypertension   Hyperlipidemia   History of MI (myocardial infarction)   Acute postoperative anemia due to expected blood loss  Resolved Problems:   * No resolved hospital problems. *  Hospital Course: Jackson Martin is a 81 y.o. male with a history of CAD, MI, atrial fibrillation on Eliquis, hypertension, hyperlipidemia, anxiety, prostate cancer. Patient presented secondary to a fall and found to have a right hip fracture. Patient was transferred from [facility] for cardiology evaluation for perioperative risk assessment. Orthopedic surgery consulted for surgical fixation.  Assessment and Plan:  Right hip fracture Secondary to fall. Orthopedic surgery consulted with plan for surgical fixation. Attempt was made on 8/3 for surgical management but was deferred secondary to uncontrolled atrial fibrillation. Patient underwent right total hip arthroplasty on 8/4. PT/OT recommending SNF.   CAD No chest pain currently. Cardiology consulted for perioperative risk assessment and recommended nuclear stress test. Nuclear stress test with non-reversible ischemia.   Paroxysmal atrial fibrillation Patient is managed on Eliquis and diltiazem. Eliquis held on admission. Patient had been rate controlled but developed RVR prior to procedure on 8/3, likely related to not receiving AM dose of diltiazem. He was treated transiently with diltiazem IV infusion which  is now discontinued. Continue diltiazem and Eliquis on discharge.   COPD Continue Breo Ellipta and Singulair   Anxiety Continue home Zoloft and Klonopin   Pressure injury Lower/bilateral perineum. Present on admission.   Consultants: Orthopedic surgery, Cardiology Procedures performed: Nuclear stress test (8/1), Right total hip arthroplasty (8/4)  Disposition: Skilled nursing facility Diet recommendation: Cardiac diet  DISCHARGE MEDICATION: Allergies as of 10/09/2021   No Known Allergies      Medication List     STOP taking these medications    buPROPion 150 MG 24 hr tablet Commonly known as: WELLBUTRIN XL   clotrimazole-betamethasone cream Commonly known as: LOTRISONE   HYDROcodone-acetaminophen 5-325 MG tablet Commonly known as: NORCO/VICODIN       TAKE these medications    alfuzosin 10 MG 24 hr tablet Commonly known as: UROXATRAL Take 10 mg by mouth daily.   allopurinol 300 MG tablet Commonly known as: ZYLOPRIM Take 300 mg by mouth daily.   apixaban 5 MG Tabs tablet Commonly known as: Eliquis Take 1 tablet (5 mg total) by mouth 2 (two) times daily.   atorvastatin 20 MG tablet Commonly known as: LIPITOR Take 20 mg by mouth at bedtime.   budesonide-formoterol 80-4.5 MCG/ACT inhaler Commonly known as: Symbicort Inhale 2 puffs into the lungs 2 (two) times daily.   citalopram 20 MG tablet Commonly known as: CELEXA Take 20 mg by mouth daily.   clonazePAM 0.5 MG tablet Commonly known as: KLONOPIN Take 1 tablet (0.5 mg total) by mouth 3 (three) times daily.   diltiazem 240 MG 24 hr capsule Commonly known as: CARDIZEM CD Take 1 capsule (240 mg total) by mouth daily.   docusate sodium 100 MG capsule Commonly known as: COLACE Take 1 capsule (  100 mg total) by mouth 2 (two) times daily.   magnesium hydroxide 400 MG/5ML suspension Commonly known as: MILK OF MAGNESIA Take 15 mLs by mouth at bedtime as needed for mild constipation.   Medical  Compression Stockings Misc 1 each by Does not apply route as directed. 1 pair of low pressure below the knee compression stockings Dx: mild orthostatic hypotension   Metamucil Smooth Texture 58.6 % powder Generic drug: psyllium Take 1 packet by mouth daily.   montelukast 10 MG tablet Commonly known as: SINGULAIR Take 10 mg by mouth daily.   oxyCODONE-acetaminophen 5-325 MG tablet Commonly known as: Percocet Take 1-2 tablets by mouth 2 (two) times daily as needed for severe pain.   polyethylene glycol 17 g packet Commonly known as: MIRALAX / GLYCOLAX Take 17 g by mouth daily as needed.   sertraline 100 MG tablet Commonly known as: ZOLOFT Take 100 mg by mouth daily as needed (anxiety).               Discharge Care Instructions  (From admission, onward)           Start     Ordered   10/06/21 0000  Weight bearing as tolerated        10/06/21 1211            Follow-up Information     Leandrew Koyanagi, MD Follow up in 2 week(s).   Specialty: Orthopedic Surgery Why: For suture removal, For wound re-check Contact information: [place] [phone]                Discharge Exam: BP 102/71 (BP Location: Left Arm)   Pulse 90   Temp 97.9 F (36.6 C) (Oral)   Resp 19   Ht '5\' 10"'$  (1.778 m)   Wt 68 kg   SpO2 98%   BMI 21.52 kg/m   General exam: Appears calm and comfortable Respiratory system: Clear to auscultation. Respiratory effort normal. Cardiovascular system: S1 & S2 heard, irregular rhythm with normal rate. Gastrointestinal system: Abdomen is nondistended, soft and nontender. Normal bowel sounds heard. Central nervous system: Alert and oriented. No focal neurological deficits. Psychiatry: Judgement and insight appear normal. Mood & affect appropriate.   Condition at discharge: stable  The results of significant diagnostics from this hospitalization (including imaging, microbiology, ancillary and laboratory)  are listed below for reference.   Imaging Studies: DG Pelvis Portable  Result Date: 10/06/2021 CLINICAL DATA:  [id number] right hip prosthesis EXAM: PORTABLE PELVIS 1-2 VIEWS COMPARISON:  None Available. FINDINGS: Two AP portable supine views of the pelvis were obtained. There are post right hip prosthetic changes seen and the prosthetic components are in near anatomical alignment. Soft tissue emphysema. Mild degenerative changes at the visualized left hip joint. Vascular calcifications. IMPRESSION: Status post right hip prosthesis and the prosthetic components are in near anatomical alignment. Soft tissue emphysema. Electronically Signed   By: Frazier Richards M.D.   On: 10/06/2021 18:03   DG HIP UNILAT WITH PELVIS 1V RIGHT  Result Date: 10/06/2021 CLINICAL DATA:  Intraoperative fluoroscopy for anterior approach total right hip arthroplasty. EXAM: DG HIP (WITH OR WITHOUT PELVIS) 1V RIGHT COMPARISON:  Pelvis and right hip radiographs 09/30/2021 FINDINGS: Images were performed intraoperatively without the presence of a radiologist. Interval total right hip arthroplasty. No evidence of hardware complication. Total fluoroscopy images: 2 Total fluoroscopy time: 23 seconds Total dose: Radiation Exposure Index (as provided by the fluoroscopic device): 0.979 mGy air Kerma Please see intraoperative  findings for further detail. IMPRESSION: Intraoperative fluoroscopy for total right hip arthroplasty. Electronically Signed   By: Yvonne Kendall M.D.   On: 10/06/2021 14:22   DG C-Arm 1-60 Min-No Report  Result Date: 10/06/2021 Fluoroscopy was utilized by the requesting physician.  No radiographic interpretation.   NM Myocar Multi W/Spect W/Wall Motion / EF  Result Date: 10/03/2021 CLINICAL DATA:  Dyspnea on exertion EXAM: MYOCARDIAL IMAGING WITH SPECT (REST AND PHARMACOLOGIC-STRESS) GATED LEFT VENTRICULAR WALL MOTION STUDY LEFT VENTRICULAR EJECTION FRACTION TECHNIQUE: Standard myocardial SPECT imaging was performed after  resting intravenous injection of 10 mCi Tc-62mtetrofosmin. Subsequently, intravenous infusion of Lexiscan was performed under the supervision of the Cardiology staff. At peak effect of the drug, 30 mCi Tc-982metrofosmin was injected intravenously and standard myocardial SPECT imaging was performed. Quantitative gated imaging was also performed to evaluate left ventricular wall motion, and estimate left ventricular ejection fraction. COMPARISON:  None Available. FINDINGS: Perfusion: Moderate size region of moderate decreased activity in the apical, mid, and basilar segment of the inferior wall is fixed on rest and stress. No evidence reversible ischemia. Wall Motion: Normal left ventricular wall motion. No left ventricular dilation. Left Ventricular Ejection Fraction: 69 % End diastolic volume 48 ml End systolic volume 15 ml IMPRESSION: 1. Fixed defect in the inferior wall consistent with scar versus diaphragmatic attenuation. No wall motion abnormality favors attenuation. 2. Normal left ventricular wall motion. 3. Left ventricular ejection fraction 69% 4. Non invasive risk stratification*: Low *2012 Appropriate Use Criteria for Coronary Revascularization Focused Update: J Am Coll Cardiol. 201610;96(0):[id number]://[email or web address]?articleid=[email or web address] Electronically Signed   By: StSuzy Bouchard.D.   On: 10/03/2021 15:15   ECHOCARDIOGRAM COMPLETE  Result Date: 10/02/2021    ECHOCARDIOGRAM REPORT   Patient Name:   CANEHAN FLAUMate of Exam: 10/02/2021 Medical Rec #:  [id number]       Height:       70.0 in Accession #:    [id number]      Weight:       150.0 lb Date of Birth:  8/Jan 31, 1941        BSA:          1.847 m Patient Age:    [age]          BP:           158/77 mmHg Patient Gender: M                 HR:           97 bpm. [facility]  [facility]: 2D Echo, Cardiac Doppler and Color Doppler Indications:    Abnormal ECG R94.31  History:        Patient has prior history  of Echocardiogram examinations, most                 recent 08/06/2016. Previous Myocardial Infarction and CAD, COPD,                 Arrythmias:Atrial Fibrillation; Risk Factors:Hypertension and                 Dyslipidemia. [place]state cancer ([place](From [place]x).  Sonographer:    BeAlvino ChapelCS Referring Phys: [id number] EMOKPAE IMPRESSIONS  1. Left ventricular ejection fraction, by estimation, is 60 to 65%. The left ventricle has normal function. The left ventricle has no regional wall motion abnormalities. Left ventricular diastolic parameters are indeterminate.  2. Right ventricular systolic function is normal.  The right ventricular size is mildly enlarged. There is normal pulmonary artery systolic pressure.  3. Left atrial size was moderately dilated.  4. Right atrial size was mildly dilated.  5. The mitral valve is normal in structure. Mild mitral valve regurgitation.  6. AV is thickened, calcified with restrticted motion. Peak and mean gradients through the valve are 27 and 12 mm Hg respectviely AVA (VTI) is 1.43 cm2 . Dimensionless index is 0.39 Overall, all consistent with mild to moderate AS. Marland Kitchen The aortic valve is  tricuspid. Aortic valve regurgitation is not visualized.  7. The inferior vena cava is dilated in size with >50% respiratory variability, suggesting right atrial pressure of 8 mmHg. FINDINGS  Left Ventricle: Left ventricular ejection fraction, by estimation, is 60 to 65%. The left ventricle has normal function. The left ventricle has no regional wall motion abnormalities. The left ventricular internal cavity size was normal in size. There is  no left ventricular hypertrophy. Left ventricular diastolic parameters are indeterminate. Right Ventricle: The right ventricular size is mildly enlarged. Right vetricular wall thickness was not assessed. Right ventricular systolic function is normal. There is normal pulmonary artery systolic pressure. The tricuspid regurgitant velocity is 2.82 m/s, and with  an assumed right atrial pressure of 3 mmHg, the estimated right ventricular systolic pressure is 53.2 mmHg. Left Atrium: Left atrial size was moderately dilated. Right Atrium: Right atrial size was mildly dilated. Pericardium: There is no evidence of pericardial effusion. Mitral Valve: The mitral valve is normal in structure. Mild mitral valve regurgitation. Tricuspid Valve: The tricuspid valve is normal in structure. Tricuspid valve regurgitation is mild. Aortic Valve: AV is thickened, calcified with restrticted motion. Peak and mean gradients through the valve are 27 and 12 mm Hg respectviely AVA (VTI) is 1.43 cm2 . Dimensionless index is 0.39 Overall, all consistent with mild to moderate AS. The aortic valve is tricuspid. Aortic valve regurgitation is not visualized. Aortic valve mean gradient measures 11.5 mmHg. Aortic valve peak gradient measures 25.6 mmHg. Aortic valve area, by VTI measures 1.23 cm. Pulmonic Valve: The pulmonic valve was grossly normal. Pulmonic valve regurgitation is not visualized. Aorta: The aortic root is normal in size and structure. Venous: The inferior vena cava is dilated in size with greater than 50% respiratory variability, suggesting right atrial pressure of 8 mmHg. IAS/Shunts: No atrial level shunt detected by color flow Doppler.  LEFT VENTRICLE PLAX 2D LVIDd:         4.00 cm LVIDs:         2.60 cm LV PW:         1.00 cm LV IVS:        1.00 cm LVOT diam:     2.00 cm LV SV:         51 LV SV Index:   28 LVOT Area:     3.14 cm  RIGHT VENTRICLE RV S prime:     17.60 cm/s TAPSE (M-mode): 2.0 cm LEFT ATRIUM             Index        RIGHT ATRIUM           Index LA diam:        4.85 cm 2.63 cm/m   RA Area:     21.70 cm LA Vol (A2C):   84.6 ml 45.80 ml/m  RA Volume:   61.50 ml  33.30 ml/m LA Vol (A4C):   81.6 ml 44.18 ml/m LA Biplane Vol: 89.9 ml 48.67 ml/m  AORTIC VALVE AV Area (Vmax):    1.04 cm AV Area (Vmean):   1.12 cm AV Area (VTI):     1.23 cm AV Vmax:           253.00  cm/s AV Vmean:          153.000 cm/s AV VTI:            0.416 m AV Peak Grad:      25.6 mmHg AV Mean Grad:      11.5 mmHg LVOT Vmax:         83.40 cm/s LVOT Vmean:        54.500 cm/s LVOT VTI:          0.163 m LVOT/AV VTI ratio: 0.39  AORTA Ao Root diam: 3.80 cm MITRAL VALVE                TRICUSPID VALVE MV Area (PHT): 3.37 cm     TR Peak grad:   31.8 mmHg MV Decel Time: 225 msec     TR Vmax:        282.00 cm/s MV E velocity: 154.00 cm/s                             SHUNTS                             Systemic VTI:  0.16 m                             Systemic Diam: 2.00 cm Dorris Carnes MD Electronically signed by Dorris Carnes MD Signature Date/Time: 10/02/2021/12:04:39 PM    Final    DG Hip Unilat With Pelvis 2-3 Views Right  Addendum Date: 09/30/2021   ADDENDUM REPORT: 09/30/2021 16:00 ADDENDUM: The patient was sent for an additional lateral view of the right hip. Poor penetration markedly limits evaluation of the additional lateral view. The femoral head is not well visualized on the additional lateral view. No additional fractures are noted. Electronically Signed   By: Dorise Bullion III M.D.   On: 09/30/2021 16:00   Result Date: 09/30/2021 CLINICAL DATA:  Hip fracture. EXAM: DG HIP (WITH OR WITHOUT PELVIS) 2-3V RIGHT COMPARISON:  None Available. FINDINGS: There is a subcapital right hip fracture. No dislocation of the femoral head. No pelvic bone fractures identified. Limited views of the left hip are normal. IMPRESSION: Subcapital right hip fracture.  No dislocation. Electronically Signed: By: Dorise Bullion III M.D. On: 09/30/2021 13:51   DG Chest 1 View  Result Date: 09/30/2021 CLINICAL DATA:  Fall. Hip fracture. EXAM: CHEST  1 VIEW COMPARISON:  One view chest x-ray 09/02/2019 FINDINGS: Heart size is normal. Atherosclerotic calcifications are present at the aortic arch. No edema or effusion is present. Scarring is noted at the left greater than right lung apex. Degenerative changes are present in both  shoulders. IMPRESSION: 1. No acute cardiopulmonary disease. 2. Atherosclerosis. Electronically Signed   By: San Morelle M.D.   On: 09/30/2021 13:51    Microbiology: Results for orders placed or performed during the hospital encounter of 09/30/21  Surgical PCR screen     Status: None   Collection Time: 10/01/21  2:01 PM   Specimen: Nasal Mucosa; Nasal Swab  Result Value Ref Range Status   MRSA, PCR NEGATIVE NEGATIVE Final   Staphylococcus aureus NEGATIVE  NEGATIVE Final    Comment: (NOTE) The Xpert SA Assay (FDA approved for NASAL specimens in patients 79 years of age and older), is one component of a comprehensive surveillance program. It is not intended to diagnose infection nor to guide or monitor treatment. Performed at [facility], [address], [place], [place] [address]     Labs: CBC: Recent Labs  Lab 10/06/21 [phone] 10/07/21 0121 10/08/21 0152 10/09/21 0951  WBC 5.6 9.5 7.9 5.8  HGB 13.7 12.8* 12.4* 12.8*  HCT 39.7 36.3* 36.2* 36.9*  MCV 91.5 90.8 91.4 92.0  PLT 237 240 231 122   Basic Metabolic Panel: Recent Labs  Lab 10/06/21 0748 10/07/21 0121 10/08/21 0152  NA 138 133* 137  K 3.9 4.4 4.2  CL 102 99 100  CO2 '28 24 30  '$ GLUCOSE 111* 141* 102*  BUN 19 24* 21  CREATININE 1.00 1.02 1.14  CALCIUM 8.9 8.9 8.9   Liver Function Tests: Recent Labs  Lab 10/06/21 0748  AST 16  ALT 12  ALKPHOS 81  BILITOT 1.2  PROT 5.7*  ALBUMIN 3.2*   CBG: Recent Labs  Lab 10/07/21 0638 10/07/21 2103 10/08/21 0806 10/08/21 1228 10/08/21 1626  GLUCAP 109* 104* 106* 106* 129*    Discharge time spent: 35 minutes.  Signed: Cordelia Poche, MD [facility] 10/09/2021

## 2021-10-09 NOTE — Care Management Important Message (Signed)
Important Message  Patient Details  Name: Jackson Martin MRN: [id number] Date of Birth: December 09, 1940   Medicare Important Message Given:  Yes     Hannah Beat 10/09/2021, 1:23 PM

## 2021-10-09 NOTE — Plan of Care (Signed)
  Problem: Education: Goal: Knowledge of General Education information will improve Description: Including pain rating scale, medication(s)/side effects and non-pharmacologic comfort measures Outcome: Progressing   Problem: Clinical Measurements: Goal: Ability to maintain clinical measurements within normal limits will improve Outcome: Progressing Goal: Will remain free from infection Outcome: Progressing Goal: Diagnostic test results will improve Outcome: Progressing   Problem: Activity: Goal: Risk for activity intolerance will decrease Outcome: Progressing   Problem: Nutrition: Goal: Adequate nutrition will be maintained Outcome: Progressing   Problem: Coping: Goal: Level of anxiety will decrease Outcome: Progressing   Problem: Elimination: Goal: Will not experience complications related to bowel motility Outcome: Progressing   Problem: Pain Managment: Goal: General experience of comfort will improve Outcome: Progressing

## 2021-10-09 NOTE — Progress Notes (Signed)
Progress Note  Patient Name: ANN GROENEVELD Date of Encounter: 10/09/2021  [facility] Cardiologist: Rozann Lesches, MD   Subjective   No chest pain or SOB  Inpatient Medications    Scheduled Meds:  apixaban  5 mg Oral BID   clonazePAM  0.5 mg Oral BID   diltiazem  240 mg Oral Daily   docusate sodium  100 mg Oral BID   fluticasone furoate-vilanterol  1 puff Inhalation Daily   methocarbamol  750 mg Oral TID   montelukast  10 mg Oral QHS   polyethylene glycol  17 g Oral Daily   senna-docusate  2 tablet Oral QHS   sertraline  100 mg Oral Daily   Continuous Infusions:  sodium chloride 75 mL/hr at 10/07/21 1748   methocarbamol (ROBAXIN) IV     PRN Meds: acetaminophen **OR** acetaminophen, acetaminophen, alum & mag hydroxide-simeth, fentaNYL (SUBLIMAZE) injection, HYDROmorphone (DILAUDID) injection, menthol-cetylpyridinium **OR** phenol, methocarbamol **OR** methocarbamol (ROBAXIN) IV, ondansetron **OR** ondansetron (ZOFRAN) IV, ondansetron **OR** ondansetron (ZOFRAN) IV, oxyCODONE, oxyCODONE, polyethylene glycol, sorbitol   Vital Signs    Vitals:   10/08/21 2101 10/09/21 0000 10/09/21 0758 10/09/21 0810  BP: 116/79   102/71  Pulse: 98 98  90  Resp:  18  19  Temp: 98.2 F (36.8 C)   97.9 F (36.6 C)  TempSrc: Oral   Oral  SpO2: 95% 99% 97% 98%  Weight:      Height:        Intake/Output Summary (Last 24 hours) at 10/09/2021 1116 Last data filed at 10/09/2021 0900 Gross per 24 hour  Intake 360 ml  Output 775 ml  Net -415 ml      10/05/2021    9:35 AM 09/30/2021    1:19 PM 08/31/2021   12:37 PM  Last 3 Weights  Weight (lbs) 150 lb 150 lb 156 lb  Weight (kg) 68.04 kg 68.04 kg 70.761 kg      Telemetry    Atrial fib with HR up to 140  - Personally Reviewed  ECG    No new - Personally Reviewed  Physical Exam   GEN: No acute distress.   Neck: No JVD Cardiac: irreg irreg, no murmurs, rubs, or gallops.  Respiratory: Clear to auscultation  bilaterally. GI: Soft, nontender, non-distended  MS: No edema; No deformity.post op rt total hip arthroplasty after fall.   Neuro:  Nonfocal  Psych: Normal affect   Labs    High Sensitivity Troponin:  No results for input(s): "TROPONINIHS" in the last 720 hours.   Chemistry Recent Labs  Lab 10/06/21 0748 10/07/21 0121 10/08/21 0152  NA 138 133* 137  K 3.9 4.4 4.2  CL 102 99 100  CO2 '28 24 30  '$ GLUCOSE 111* 141* 102*  BUN 19 24* 21  CREATININE 1.00 1.02 1.14  CALCIUM 8.9 8.9 8.9  PROT 5.7*  --   --   ALBUMIN 3.2*  --   --   AST 16  --   --   ALT 12  --   --   ALKPHOS 81  --   --   BILITOT 1.2  --   --   GFRNONAA >60 >60 >60  ANIONGAP '8 10 7    '$ Lipids No results for input(s): "CHOL", "TRIG", "HDL", "LABVLDL", "LDLCALC", "CHOLHDL" in the last 168 hours.  Hematology Recent Labs  Lab 10/07/21 0121 10/08/21 0152 10/09/21 0951  WBC 9.5 7.9 5.8  RBC 4.00* 3.96* 4.01*  HGB 12.8* 12.4* 12.8*  HCT 36.3*  36.2* 36.9*  MCV 90.8 91.4 92.0  MCH 32.0 31.3 31.9  MCHC 35.3 34.3 34.7  RDW 12.6 12.7 12.9  PLT 240 231 240   Thyroid No results for input(s): "TSH", "FREET4" in the last 168 hours.  BNPNo results for input(s): "BNP", "PROBNP" in the last 168 hours.  DDimer No results for input(s): "DDIMER" in the last 168 hours.   Radiology    No results found.  Cardiac Studies   Myoview stress 10/03/21   IMPRESSION: 1. Fixed defect in the inferior wall consistent with scar versus diaphragmatic attenuation. No wall motion abnormality favors attenuation.   2. Normal left ventricular wall motion.   3. Left ventricular ejection fraction 69%   4. Non invasive risk stratification*: Low    ECHO: 10/02/2021  1. Left ventricular ejection fraction, by estimation, is 60 to 65%. The  left ventricle has normal function. The left ventricle has no regional  wall motion abnormalities. Left ventricular diastolic parameters are  indeterminate.   2. Right ventricular systolic function  is normal. The right ventricular  size is mildly enlarged. There is normal pulmonary artery systolic  pressure.   3. Left atrial size was moderately dilated.   4. Right atrial size was mildly dilated.   5. The mitral valve is normal in structure. Mild mitral valve  regurgitation.   6. AV is thickened, calcified with restrticted motion. Peak and mean  gradients through the valve are 27 and 12 mm Hg respectviely AVA (VTI) is 1.43 cm2 . Dimensionless index is 0.39 Overall, all consistent with mild  to moderate AS. Marland Kitchen The aortic valve is tricuspid. Aortic valve regurgitation is not visualized.   7. The inferior vena cava is dilated in size with >50% respiratory  variability, suggesting right atrial pressure of 8 mmHg.   Patient Profile     81 y.o. male with a hx of Afib on Eliquis, HTN, HLD, anxiety, COPD, MI no further details available), gout, who was cleared for hip arthoplasty after a fall with echo and neg nuc study.    Assessment & Plan    Permanent atrial fib back on eliquis post op.  His HR is elevated to 140 at times today  on dilt 240  Fall with Rt hip fx and Rt hip arthroplasty.  Has been doing well.  Fall was due to dizziness. He was on floor for 12 hours before being found.  Monitor for recurrent dizziness.   HTN  systolic 016 to 553, on dilt 240   Mild to Mod AS no syncope        For questions or updates, please contact [place] Please consult [email or web address] for contact info under        Signed, Cecilie Kicks, NP  10/09/2021, 11:16 AM

## 2021-10-09 NOTE — Plan of Care (Signed)
  Problem: Health Behavior/Discharge Planning: Goal: Ability to manage health-related needs will improve Outcome: Progressing   Problem: Clinical Measurements: Goal: Ability to maintain clinical measurements within normal limits will improve Outcome: Progressing   Problem: Clinical Measurements: Goal: Will remain free from infection Outcome: Progressing   Problem: Activity: Goal: Risk for activity intolerance will decrease Outcome: Progressing   Problem: Nutrition: Goal: Adequate nutrition will be maintained Outcome: Progressing   Problem: Coping: Goal: Level of anxiety will decrease Outcome: Progressing

## 2021-10-09 NOTE — Progress Notes (Signed)
  Mobility Specialist Criteria Algorithm Info.    10/09/21 1535  Mobility  Activity Refused mobility   Declined for unspecified reasons. To be discharged soon   10/09/2021 4:15 PM  [place] Mellony Danziger, Rafael Gonzalez, [place]  ZXYOF:[phone] Office: [phone]

## 2021-10-09 NOTE — TOC Initial Note (Signed)
Transition of Care [facility]) - Initial/Assessment Note    Patient Details  Name: Jackson Martin MRN: [id number] Date of Birth: [birth year]  Transition of Care [facility]) CM/SW Contact:    Joanne Chars, LCSW Phone Number: 10/09/2021, 11:13 AM  Clinical Narrative:         CSW met with pt regarding DC recommendation for SNF.  Pt agreeable, choice document given, prefers facility in [facility].  Pt lives alone.  Permission given to speak with nephew Legrand Como.   Pt is vaccinated for covid with multiple boosters.  Referral sent out in hub.  1045: bed offers presented to pt, pt would like to [facility].  CSW confirmed bed today with [facility].    Auth submitted in [place] and approved: [id number], 3 days: 8/7-8/9.  MD informed.            Expected Discharge Plan: Skilled Nursing Facility Barriers to Discharge: Continued Medical Work up   Patient Goals and CMS Choice Patient states their goals for this hospitalization and ongoing recovery are:: to get better.      Expected Discharge Plan and Services Expected Discharge Plan: [place] In-house Referral: Clinical Social Work   Post Acute Care Choice: [place] Living arrangements for the past 2 months: [place]                                      Prior Living Arrangements/Services Living arrangements for the past 2 months: Single Family Home Lives with:: Self Patient language and need for interpreter reviewed:: Yes Do you feel safe going back to the place where you live?: Yes      Need for Family Participation in Patient Care: No (Comment) Care giver support system in place?: Yes (comment) Current home services: Other (comment) (none) Criminal Activity/Legal Involvement Pertinent to Current Situation/Hospitalization: No - Comment as needed  Activities of Daily Living Home Assistive Devices/Equipment: None ADL Screening (condition at time of  admission) Patient's cognitive ability adequate to safely complete daily activities?: No Is the patient deaf or have difficulty hearing?: No Does the patient have difficulty seeing, even when wearing glasses/contacts?: No Does the patient have difficulty concentrating, remembering, or making decisions?: No Patient able to express need for assistance with ADLs?: Yes Does the patient have difficulty dressing or bathing?: No Independently performs ADLs?: Yes (appropriate for developmental age) Does the patient have difficulty walking or climbing stairs?: No Weakness of Legs: Both Weakness of Arms/Hands: None  Permission Sought/Granted Permission sought to share information with : Family Supports Permission granted to share information with : Yes, Verbal Permission Granted  Share Information with NAME: nephew Legrand Como  Permission granted to share info w AGENCY: SNF        Emotional Assessment Appearance:: Appears stated age Attitude/Demeanor/Rapport: Engaged Affect (typically observed): Appropriate, Pleasant Orientation: : Oriented to Self, Oriented to Place, Oriented to  Time, Oriented to Situation      Admission diagnosis:  Closed right hip fracture ([place]) [S72.001A] Closed subcapital fracture of right femur, initial encounter ([place]) [S72.011A] Patient Active Problem List   Diagnosis Date Noted   Displaced fracture of right femoral neck ([place]) 09/30/2021   Anxiety 09/30/2021   Atrial fibrillation ([place]) 09/30/2021   Essential hypertension 09/30/2021   Hyperlipidemia 09/30/2021   Elevated alkaline phosphatase level 01/31/2021   Constipation 05/31/2020   Ulcerated external hemorrhoids 05/31/2020   Diverticulitis 09/02/2019  Blood in stool 09/23/2017   Dyspnea on exertion 12/25/2016   COPD  GOLD 0/ ? AB 12/25/2016   Gastrointestinal hemorrhage 10/29/2016   History of MI (myocardial infarction) 2003   PCP:  Monico Blitz, MD Pharmacy:   [facility] [place] [address] [place] [address] Phone: [phone] Fax: [phone]     Social Determinants of Health (SDOH) Interventions    Readmission Risk Interventions    10/02/2021   12:58 PM  Readmission Risk Prevention Plan  Post Dischage Appt Not Complete  Medication Screening Complete  Transportation Screening Complete

## 2021-10-10 ENCOUNTER — Non-Acute Institutional Stay (SKILLED_NURSING_FACILITY): Payer: Medicare HMO | Admitting: Adult Health

## 2021-10-10 ENCOUNTER — Encounter: Payer: Self-pay | Admitting: Adult Health

## 2021-10-10 DIAGNOSIS — M1A00X0 Idiopathic chronic gout, unspecified site, without tophus (tophi): Secondary | ICD-10-CM | POA: Diagnosis not present

## 2021-10-10 DIAGNOSIS — D62 Acute posthemorrhagic anemia: Secondary | ICD-10-CM | POA: Diagnosis not present

## 2021-10-10 DIAGNOSIS — I10 Essential (primary) hypertension: Secondary | ICD-10-CM

## 2021-10-10 DIAGNOSIS — I700 Atherosclerosis of aorta: Secondary | ICD-10-CM | POA: Diagnosis not present

## 2021-10-10 DIAGNOSIS — F339 Major depressive disorder, recurrent, unspecified: Secondary | ICD-10-CM | POA: Diagnosis not present

## 2021-10-10 DIAGNOSIS — J449 Chronic obstructive pulmonary disease, unspecified: Secondary | ICD-10-CM

## 2021-10-10 DIAGNOSIS — I4821 Permanent atrial fibrillation: Secondary | ICD-10-CM | POA: Diagnosis not present

## 2021-10-10 DIAGNOSIS — I2510 Atherosclerotic heart disease of native coronary artery without angina pectoris: Secondary | ICD-10-CM | POA: Insufficient documentation

## 2021-10-10 DIAGNOSIS — M1A9XX0 Chronic gout, unspecified, without tophus (tophi): Secondary | ICD-10-CM | POA: Insufficient documentation

## 2021-10-10 DIAGNOSIS — N400 Enlarged prostate without lower urinary tract symptoms: Secondary | ICD-10-CM

## 2021-10-10 DIAGNOSIS — S72001A Fracture of unspecified part of neck of right femur, initial encounter for closed fracture: Secondary | ICD-10-CM

## 2021-10-10 DIAGNOSIS — E785 Hyperlipidemia, unspecified: Secondary | ICD-10-CM

## 2021-10-10 DIAGNOSIS — K5909 Other constipation: Secondary | ICD-10-CM

## 2021-10-10 NOTE — Progress Notes (Signed)
Location:  [place] Room Number: 136 Place of Service:  SNF (31)   CODE STATUS: full  No Known Allergies  Chief Complaint  Patient presents with  . Acute Visit    Hospitalization follow up    HPI:  He is a 81 year old man who has been hospitalized from 09-30-21 through 10-09-21. His medical history includes: CAD: MI: atrial fibrillation; prostate cancer; anxiety. He had a mechanical fall the night prior to his fall. He was unable to get up. He was found by family approximately 21 hours after the fall. He had poorly controlled atrial fibrillation which postponed to 10-06-21 when he had a right hip total hip arthroplasty. He will need SNF for short term rehab. His goal is to return back home. He denies any pain or constipation; or any changes in appetite. He will continue to be followed for his chronic illnesses including: ***     Past Medical History:  Diagnosis Date  . Anxiety   . Asthmatic bronchitis   . Atrial fibrillation ([place])   . Backache   . Colon polyps   . COPD (chronic obstructive pulmonary disease) ([place])   . Diverticulitis 12/2019  . Esophageal reflux   . Essential hypertension   . Gout   . Hemorrhoid   . Hyperlipidemia   . Insomnia   . Ischemic heart disease   . MI (myocardial infarction) ([place]) 2003  . Neck pain   . Plantar fasciitis   . Prostate cancer [facility])     Past Surgical History:  Procedure Laterality Date  . COLONOSCOPY    . COLONOSCOPY N/A 11/15/2016   Procedure: COLONOSCOPY;  Surgeon: Rogene Houston, MD;  [facility];  Service: Endoscopy;  Laterality: N/A;  1200  . COLONOSCOPY WITH PROPOFOL N/A 01/20/2020   Procedure: COLONOSCOPY WITH PROPOFOL;  Surgeon: Rogene Houston, MD;  Location: [facility];  Service: Endoscopy;  Laterality: N/A;  1:15, pt refused to change time  . CORONARY ANGIOPLASTY WITH STENT PLACEMENT  2007   4 stents placed  . POLYPECTOMY  11/15/2016   Procedure: POLYPECTOMY;  Surgeon: Rogene Houston, MD;  Location: [facility];  Service: Endoscopy;;  colon  . POLYPECTOMY  01/20/2020   Procedure: POLYPECTOMY;  Surgeon: Rogene Houston, MD;  [facility];  Service: Endoscopy;;  . PROSTATECTOMY  2001  . TOTAL HIP ARTHROPLASTY Right 10/06/2021   Procedure: TOTAL HIP ARTHROPLASTY ANTERIOR APPROACH;  Surgeon: Leandrew Koyanagi, MD;  Location: [facility];  Service: Orthopedics;  Laterality: Right;    Social History   Socioeconomic History  . Marital status: Single    Spouse name: Not on file  . Number of children: Not on file  . Years of education: Not on file  . Highest education level: Not on file  Occupational History  . Not on file  Tobacco Use  . Smoking status: Former    Packs/day: 0.75    Years: 3.00    Total pack years: 2.25    Types: Cigarettes    Quit date: 03/06/1991    Years since quitting: 30.6  . Smokeless tobacco: Never  Vaping Use  . Vaping Use: Never used  Substance and Sexual Activity  . Alcohol use: No  . Drug use: No  . Sexual activity: Not on file  Other Topics Concern  . Not on file  Social History Narrative  . Not on file   Social Determinants of Health   Financial  Resource Strain: Not on file  Food Insecurity: Not on file  Transportation Needs: Not on file  Physical Activity: Not on file  Stress: Not on file  Social Connections: Not on file  Intimate Partner Violence: Not on file   Family History  Problem Relation Age of Onset  . Hypertension Mother   . Heart disease Mother   . Heart disease Maternal Aunt   . Heart disease Maternal Uncle   . Heart disease Maternal Grandmother   . Heart disease Maternal Grandfather   . Colon cancer Brother   . Breast cancer Sister   . Lung cancer Sister   . Lung disease Sister       VITAL SIGNS BP 123/81   Pulse (!) 59   Temp (!) 97.1 F (36.2 C)   Resp 20   Ht '5\' 10"'$  (1.778 m)   Wt 144 lb 12.8 oz (65.7 kg)   SpO2 96%   BMI 20.78 kg/m   Outpatient Encounter Medications as of  10/10/2021  Medication Sig Note  . alfuzosin (UROXATRAL) 10 MG 24 hr tablet Take 10 mg by mouth daily.   Marland Kitchen allopurinol (ZYLOPRIM) 300 MG tablet Take 300 mg by mouth daily.    Marland Kitchen apixaban (ELIQUIS) 5 MG TABS tablet Take 1 tablet (5 mg total) by mouth 2 (two) times daily.   Marland Kitchen atorvastatin (LIPITOR) 20 MG tablet Take 20 mg by mouth at bedtime.    . budesonide-formoterol (SYMBICORT) 80-4.5 MCG/ACT inhaler Inhale 2 puffs into the lungs 2 (two) times daily.   . citalopram (CELEXA) 20 MG tablet Take 20 mg by mouth daily.   . clonazePAM (KLONOPIN) 0.5 MG tablet Take 0.5 mg by mouth 2 (two) times daily.   Marland Kitchen diltiazem (CARDIZEM CD) 240 MG 24 hr capsule Take 1 capsule (240 mg total) by mouth daily.   Marland Kitchen docusate sodium (COLACE) 100 MG capsule Take 1 capsule (100 mg total) by mouth 2 (two) times daily.   Regino Schultze Bandages & Supports (MEDICAL COMPRESSION STOCKINGS) MISC 1 each by Does not apply route as directed. 1 pair of low pressure below the knee compression stockings Dx: mild orthostatic hypotension   . magnesium hydroxide (MILK OF MAGNESIA) 400 MG/5ML suspension Take 15 mLs by mouth at bedtime as needed for mild constipation.   . montelukast (SINGULAIR) 10 MG tablet Take 10 mg by mouth daily.   Marland Kitchen oxyCODONE-acetaminophen (PERCOCET/ROXICET) 5-325 MG tablet Take 1 tablet by mouth every 6 (six) hours as needed for severe pain.   . polyethylene glycol (MIRALAX / GLYCOLAX) 17 g packet Take 17 g by mouth daily as needed.   . psyllium (METAMUCIL SMOOTH TEXTURE) 58.6 % powder Take 1 packet by mouth daily. 08/31/2021: prn  . sertraline (ZOLOFT) 100 MG tablet Take 100 mg by mouth daily as needed (anxiety).    No facility-administered encounter medications on file as of 10/10/2021.     SIGNIFICANT DIAGNOSTIC EXAMS  TODAY  09-30-21; cxr 1. No acute cardiopulmonary disease. 2. Atherosclerosis.  10-03-21: myoview 1. Fixed defect in the inferior wall consistent with scar versus diaphragmatic attenuation. No wall  motion abnormality favors attenuation. 2. Normal left ventricular wall motion. 3. Left ventricular ejection fraction 69% 4. Non invasive risk stratification*: Low  LABS REVIEWED:   09-30-21: wbc 10.8; hgb 14.0; hct 42.1; mcv 93.6 plt 219; glucose 109; bun 26; creat 1.17; k+ 3.9; na++ 140; ca 9.4; gfr >60; ck 1581 10-02-21: wbc 8.5; hgb 12.7; hct 39.0; mcv 996.3 plt 220; glucose 111; bun 28;  creat 1.13; k+ 4.2; na++ 141; ca 9.1 gfr >60; phos 3.0 albumin 3.7 10-06-21: wbc 5.6; hgb 13.7; hct 39.7; mcv 91.5 plt 237; glucose 111; bun 19; creat 1.00 ;k+ 3.9; na++ 138; ca 8.9; gfr >60; protein 5.7 albumin 3.2; ck 44 10-08-21: wbc 7.9; hgb 12.4; hct 36.2; mcv 91.4 plt 231; glucose 102; bun 21; creat 1.14; k+ 4.2; na++ 137; ca 8.9; gfr >60 10-09-21: wbc 5.8; hgb 12.8; hct 36.9; mcv 92.0 plt 240   Review of Systems  Constitutional:  Negative for malaise/fatigue.  Respiratory:  Negative for cough and shortness of breath.   Cardiovascular:  Negative for chest pain, palpitations and leg swelling.  Gastrointestinal:  Negative for abdominal pain, constipation and heartburn.  Musculoskeletal:  Negative for back pain, joint pain and myalgias.  Skin: Negative.   Neurological:  Negative for dizziness.  Psychiatric/Behavioral:  The patient is not nervous/anxious.     Physical Exam Constitutional:      General: He is not in acute distress.    Appearance: He is well-developed. He is not diaphoretic.  HENT:     Mouth/Throat:     Comments: Poor dentation  Neck:     Thyroid: No thyromegaly.  Cardiovascular:     Rate and Rhythm: Normal rate and regular rhythm.     Pulses: Normal pulses.     Heart sounds: Murmur heard.  Pulmonary:     Effort: Pulmonary effort is normal. No respiratory distress.     Breath sounds: Normal breath sounds.  Abdominal:     General: Bowel sounds are normal. There is no distension.     Palpations: Abdomen is soft.     Tenderness: There is no abdominal tenderness.  Musculoskeletal:      Cervical back: Neck supple.     Right lower leg: No edema.     Left lower leg: No edema.     Comments: Is able to move all extremities  Status post right hip arthroplasty   Lymphadenopathy:     Cervical: No cervical adenopathy.  Skin:    General: Skin is warm and dry.  Neurological:     Mental Status: He is alert and oriented to person, place, and time.  Psychiatric:        Mood and Affect: Mood normal.      ASSESSMENT/ PLAN:   ***  Ok Edwards NP [facility]  call [phone]

## 2021-10-11 ENCOUNTER — Encounter: Payer: Self-pay | Admitting: Internal Medicine

## 2021-10-11 ENCOUNTER — Non-Acute Institutional Stay (SKILLED_NURSING_FACILITY): Payer: Medicare HMO | Admitting: Internal Medicine

## 2021-10-11 DIAGNOSIS — E440 Moderate protein-calorie malnutrition: Secondary | ICD-10-CM | POA: Diagnosis not present

## 2021-10-11 DIAGNOSIS — I4821 Permanent atrial fibrillation: Secondary | ICD-10-CM

## 2021-10-11 DIAGNOSIS — S72001A Fracture of unspecified part of neck of right femur, initial encounter for closed fracture: Secondary | ICD-10-CM

## 2021-10-11 DIAGNOSIS — D62 Acute posthemorrhagic anemia: Secondary | ICD-10-CM | POA: Diagnosis not present

## 2021-10-11 DIAGNOSIS — I10 Essential (primary) hypertension: Secondary | ICD-10-CM | POA: Diagnosis not present

## 2021-10-11 LAB — GLUCOSE, CAPILLARY: Glucose-Capillary: 122 mg/dL — ABNORMAL HIGH (ref 70–99)

## 2021-10-11 NOTE — Progress Notes (Signed)
NURSING HOME LOCATION:  [facility] ROOM NUMBER:  136 P  CODE STATUS:  Full Code  RUE:[id number] Manuella Ghazi MD  This is a comprehensive admission note to this SNFperformed on this date less than 30 days from date of admission. Included are preadmission medical/surgical history; reconciled medication list; family history; social history and comprehensive review of systems.  Corrections and additions to the records were documented. Comprehensive physical exam was also performed. Additionally a clinical summary was entered for each active diagnosis pertinent to this admission in the Problem List to enhance continuity of care.  HPI: He was hospitalized 7/29 - 10/09/2021 for displaced fracture of the right femoral neck.  The admission history states that he had fallen the night prior to admission and had apparently been on the ground for up to 12 hours before being found by his family. During my interview he stated that he had actually slipped on damp gravel in October and fallen.  He states that he had been seen multiple times in the interval and was followed by [facility] rehab when an MRI documented the fracture. He was transferred from [facility] for cardiac evaluation to assess perioperative risk as he has a long history of CAD with MI and atrial fibrillation on Eliquis. Surgery was deferred 8/3 because of atrial fibrillation with RVR.  CK had been 1581 at admission.  Cardiology consulted; nuclear stress test revealed nonreversible ischemia.  The A-fib with RVR was attributed to him not receiving his a.m. dose of diltiazem the day of planned surgery.  He received transient IV diltiazem with adequate response. Preop H/H was 14/42.1; postop H/H was 12.8/36.9.  Protein/caloric malnutrition was suggested by total protein of 5.7 and albumin of 3.2.  While hospitalized glucoses ranged from a low of 100 up to 144. PT/OT consulted and recommended SNF placement for rehab.  Past medical  and surgical history is long and complicated and includes history of colon polyps, COPD, history of diverticulitis, GERD, essential hypertension, history of gout, dyslipidemia, history of prostate cancer.  Recent procedures include colonoscopy with polypectomy, coronary angioplasty and stent placement, and prostatectomy.  Social history: Nondrinker; minimal smoking history.  Family history:reviewed, strong FH of heart disease.   Review of systems: As noted he relates the present hip fracture to fall in October.  He was complaining of receiving "a $400 bill from [facility]" at that time "because 4 leads were not plugged in.".  I could get no further explanation.  He describes occasional left-sided chest pain.  He validates active anxiety and depression.  Constitutional: No fever, significant weight change, fatigue  Eyes: No redness, discharge, pain, vision change ENT/mouth: No nasal congestion, purulent discharge, earache, change in hearing, sore throat  Cardiovascular: No  paroxysmal nocturnal dyspnea, claudication, edema  Respiratory: No cough, sputum production, hemoptysis, DOE, significant snoring, apnea Gastrointestinal: No heartburn, dysphagia, abdominal pain, nausea /vomiting, rectal bleeding, melena, change in bowels Genitourinary: No dysuria, hematuria, pyuria, incontinence, nocturia Musculoskeletal: No joint stiffness, joint swelling, weakness, pain Dermatologic: No rash, pruritus, change in appearance of skin Neurologic: No dizziness, headache, syncope, seizures, numbness, tingling Psychiatric: No significant insomnia, anorexia Endocrine: No change in hair/skin/nails, excessive thirst, excessive hunger, excessive urination  Hematologic/lymphatic: No significant bruising, lymphadenopathy, abnormal bleeding Allergy/immunology: No itchy/watery eyes, significant sneezing, urticaria, angioedema  Physical exam:  Pertinent or positive findings: He appears his age and somewhat chronically  malnourished.  Pattern alopecia is present.  He is hoarse.  He has only 3 lower teeth remaining which are  plaque encrusted.  He has a very thin goatee.  Rhythm was slightly irregular, but rate well-controlled.  Chest was surprisingly clear.  Pedal pulses are decreased.  Interosseous wasting of the hands is noted.  General appearance: no acute distress, increased work of breathing is present.   Lymphatic: No lymphadenopathy about the head, neck, axilla. Eyes: No conjunctival inflammation or lid edema is present. There is no scleral icterus. Ears:  External ear exam shows no significant lesions or deformities.   Nose:  External nasal examination shows no deformity or inflammation. Nasal mucosa are pink and moist without lesions, exudates Neck:  No thyromegaly, masses, tenderness noted.    Heart:  No gallop, murmur, click, rub.  Lungs:  without wheezes, rhonchi, rales, rubs. Abdomen: Bowel sounds are normal.  Abdomen is soft and nontender with no organomegaly, hernias, masses. GU: Deferred  Extremities:  No cyanosis, clubbing, edema. Neurologic exam:  Balance, Rhomberg, finger to nose testing could not be completed due to clinical state Skin: Warm & dry w/o tenting. No significant lesions or rash.  See clinical summary under each active problem in the Problem List with associated updated therapeutic plan

## 2021-10-12 DIAGNOSIS — E46 Unspecified protein-calorie malnutrition: Secondary | ICD-10-CM | POA: Insufficient documentation

## 2021-10-12 NOTE — Assessment & Plan Note (Addendum)
Rhythm is slightly irregular but rate is well-controlled.  Continue Eliquis prophylaxis as well as rate controlling CCB.

## 2021-10-12 NOTE — Assessment & Plan Note (Signed)
Orthopedic follow-up as scheduled.  PT/OT at SNF as tolerated. 

## 2021-10-12 NOTE — Assessment & Plan Note (Signed)
Total protein 5.7 albumin 3.2.  Interosseous wasting on exam.  Patient appears suboptimally nourished.  Nutrition consult at SNF.

## 2021-10-12 NOTE — Assessment & Plan Note (Signed)
BP controlled; no change in antihypertensive medications  

## 2021-10-12 NOTE — Patient Instructions (Signed)
See assessment and plan under each diagnosis in the problem list and acutely for this visit 

## 2021-10-12 NOTE — Assessment & Plan Note (Signed)
Postop H/H 12.8/36.9.  No bleeding dyscrasias reported at the SNF on Eliquis.

## 2021-10-16 ENCOUNTER — Encounter: Payer: Self-pay | Admitting: Adult Health

## 2021-10-16 ENCOUNTER — Non-Acute Institutional Stay (SKILLED_NURSING_FACILITY): Payer: Medicare HMO | Admitting: Adult Health

## 2021-10-16 ENCOUNTER — Other Ambulatory Visit: Payer: Self-pay | Admitting: Adult Health

## 2021-10-16 DIAGNOSIS — S72001A Fracture of unspecified part of neck of right femur, initial encounter for closed fracture: Secondary | ICD-10-CM

## 2021-10-16 MED ORDER — CLONAZEPAM 0.5 MG PO TABS: 0.5000 mg | ORAL_TABLET | Freq: Two times a day (BID) | ORAL | 0 refills | Status: DC

## 2021-10-16 NOTE — Progress Notes (Signed)
Location:  [place] Room Number: 136 Place of Service:  SNF (31)   CODE STATUS: full   No Known Allergies  Chief Complaint  Patient presents with   Acute Visit    Pain management     HPI:  He is having right lower extremity pain which radiates down his right leg. He is able to participate in therapy and is ambulating with a walker in therapy. He has completed his vicodin. We have had a discussed about the pro's and con's of continued use of level 2 narcotics. He did verbalize understanding.   Past Medical History:  Diagnosis Date   Anxiety    Asthmatic bronchitis    Atrial fibrillation (HCC)    Backache    Colon polyps    COPD (chronic obstructive pulmonary disease) ([place])    Diverticulitis 12/2019   Esophageal reflux    Essential hypertension    Gout    Hemorrhoid    Hyperlipidemia    Insomnia    Ischemic heart disease    MI (myocardial infarction) ([place]) 2003   Neck pain    Plantar fasciitis    Prostate cancer [facility])     Past Surgical History:  Procedure Laterality Date   COLONOSCOPY     COLONOSCOPY N/A 11/15/2016   Procedure: COLONOSCOPY;  Surgeon: Rogene Houston, MD;  [facility];  Service: Endoscopy;  Laterality: N/A;  1200   COLONOSCOPY WITH PROPOFOL N/A 01/20/2020   Procedure: COLONOSCOPY WITH PROPOFOL;  Surgeon: Rogene Houston, MD;  Location: [facility];  Service: Endoscopy;  Laterality: N/A;  1:15, pt refused to change time   CORONARY ANGIOPLASTY WITH STENT PLACEMENT  2007   4 stents placed   POLYPECTOMY  11/15/2016   Procedure: POLYPECTOMY;  Surgeon: Rogene Houston, MD;  [facility];  Service: Endoscopy;;  colon   POLYPECTOMY  01/20/2020   Procedure: POLYPECTOMY;  Surgeon: Rogene Houston, MD;  [facility];  Service: Endoscopy;;   PROSTATECTOMY  2001   TOTAL HIP ARTHROPLASTY Right 10/06/2021   Procedure: TOTAL HIP ARTHROPLASTY ANTERIOR APPROACH;  Surgeon: Leandrew Koyanagi, MD;  Location:  [place];  Service: Orthopedics;  Laterality: Right;    Social History   Socioeconomic History   Marital status: Single    Spouse name: Not on file   Number of children: Not on file   Years of education: Not on file   Highest education level: Not on file  Occupational History   Not on file  Tobacco Use   Smoking status: Former    Packs/day: 0.75    Years: 3.00    Total pack years: 2.25    Types: Cigarettes    Quit date: 03/06/1991    Years since quitting: 30.6   Smokeless tobacco: Never  Vaping Use   Vaping Use: Never used  Substance and Sexual Activity   Alcohol use: No   Drug use: No   Sexual activity: Not on file  Other Topics Concern   Not on file  Social History Narrative   Not on file   Social Determinants of Health   Financial Resource Strain: Not on file  Food Insecurity: Not on file  Transportation Needs: Not on file  Physical Activity: Not on file  Stress: Not on file  Social Connections: Not on file  Intimate Partner Violence: Not on file   Family History  Problem Relation Age of Onset   Hypertension Mother  Heart disease Mother    Heart disease Maternal Aunt    Heart disease Maternal Uncle    Heart disease Maternal Grandmother    Heart disease Maternal Grandfather    Colon cancer Brother    Breast cancer Sister    Lung cancer Sister    Lung disease Sister       VITAL SIGNS BP 122/76   Pulse 84   Temp 98.5 F (36.9 C)   Resp 20   Ht '5\' 8"'$  (1.727 m)   Wt 147 lb 9.6 oz (67 kg)   SpO2 98%   BMI 22.44 kg/m   Outpatient Encounter Medications as of 10/16/2021  Medication Sig Note   alfuzosin (UROXATRAL) 10 MG 24 hr tablet Take 10 mg by mouth daily.    allopurinol (ZYLOPRIM) 300 MG tablet Take 300 mg by mouth daily.     apixaban (ELIQUIS) 5 MG TABS tablet Take 1 tablet (5 mg total) by mouth 2 (two) times daily.    atorvastatin (LIPITOR) 20 MG tablet Take 20 mg by mouth at bedtime.     budesonide-formoterol (SYMBICORT) 80-4.5 MCG/ACT inhaler  Inhale 2 puffs into the lungs 2 (two) times daily.    citalopram (CELEXA) 20 MG tablet Take 20 mg by mouth daily.    clonazePAM (KLONOPIN) 0.5 MG tablet Take 1 tablet (0.5 mg total) by mouth 2 (two) times daily.    diltiazem (CARDIZEM CD) 240 MG 24 hr capsule Take 1 capsule (240 mg total) by mouth daily.    docusate sodium (COLACE) 100 MG capsule Take 1 capsule (100 mg total) by mouth 2 (two) times daily.    Elastic Bandages & Supports (MEDICAL COMPRESSION STOCKINGS) MISC 1 each by Does not apply route as directed. 1 pair of low pressure below the knee compression stockings Dx: mild orthostatic hypotension    magnesium hydroxide (MILK OF MAGNESIA) 400 MG/5ML suspension Take 15 mLs by mouth at bedtime as needed for mild constipation.    montelukast (SINGULAIR) 10 MG tablet Take 10 mg by mouth daily.    polyethylene glycol (MIRALAX / GLYCOLAX) 17 g packet Take 17 g by mouth daily as needed.    psyllium (METAMUCIL SMOOTH TEXTURE) 58.6 % powder Take 1 packet by mouth daily. 08/31/2021: prn   sertraline (ZOLOFT) 100 MG tablet Take 100 mg by mouth daily as needed (anxiety).    No facility-administered encounter medications on file as of 10/16/2021.     SIGNIFICANT DIAGNOSTIC EXAMS  PREVIOUS   09-30-21; cxr 1. No acute cardiopulmonary disease. 2. Atherosclerosis.  10-03-21: myoview 1. Fixed defect in the inferior wall consistent with scar versus diaphragmatic attenuation. No wall motion abnormality favors attenuation. 2. Normal left ventricular wall motion. 3. Left ventricular ejection fraction 69% 4. Non invasive risk stratification*: Low  NO NEW EXAMS   LABS REVIEWED: PREVIOUS   09-30-21: wbc 10.8; hgb 14.0; hct 42.1; mcv 93.6 plt 219; glucose 109; bun 26; creat 1.17; k+ 3.9; na++ 140; ca 9.4; gfr >60; ck 1581 10-02-21: wbc 8.5; hgb 12.7; hct 39.0; mcv 996.3 plt 220; glucose 111; bun 28; creat 1.13; k+ 4.2; na++ 141; ca 9.1 gfr >60; phos 3.0 albumin 3.7 10-06-21: wbc 5.6; hgb 13.7; hct 39.7;  mcv 91.5 plt 237; glucose 111; bun 19; creat 1.00 ;k+ 3.9; na++ 138; ca 8.9; gfr >60; protein 5.7 albumin 3.2; ck 44 10-08-21: wbc 7.9; hgb 12.4; hct 36.2; mcv 91.4 plt 231; glucose 102; bun 21; creat 1.14; k+ 4.2; na++ 137; ca 8.9; gfr >60 10-09-21: wbc  5.8; hgb 12.8; hct 36.9; mcv 92.0 plt 240  NO NEW LABS.   Review of Systems  Constitutional:  Negative for malaise/fatigue.  Respiratory:  Negative for cough and shortness of breath.   Cardiovascular:  Negative for chest pain, palpitations and leg swelling.  Gastrointestinal:  Negative for abdominal pain, constipation and heartburn.  Musculoskeletal:  Positive for joint pain. Negative for back pain and myalgias.       Right hip pain which radiates down his right lower leg   Skin: Negative.   Neurological:  Negative for dizziness.  Psychiatric/Behavioral:  The patient is not nervous/anxious.    Physical Exam Constitutional:      General: He is not in acute distress.    Appearance: He is well-developed. He is not diaphoretic.  Neck:     Thyroid: No thyromegaly.  Cardiovascular:     Rate and Rhythm: Normal rate and regular rhythm.     Pulses: Normal pulses.     Heart sounds: Murmur heard.  Pulmonary:     Effort: Pulmonary effort is normal. No respiratory distress.     Breath sounds: Normal breath sounds.  Abdominal:     General: Bowel sounds are normal. There is no distension.     Palpations: Abdomen is soft.     Tenderness: There is no abdominal tenderness.  Musculoskeletal:     Cervical back: Neck supple.     Right lower leg: No edema.     Left lower leg: No edema.     Comments:  Is able to move all extremities  Status post right hip arthroplasty    Lymphadenopathy:     Cervical: No cervical adenopathy.  Skin:    General: Skin is warm and dry.  Neurological:     Mental Status: He is alert and oriented to person, place, and time.  Psychiatric:        Mood and Affect: Mood normal.      ASSESSMENT/  PLAN:  TODAY  Displaced fracture of right femoral neck:   Will not restart narcotics Will begin tylenol cr 650 mg every 6 hours routinely Will begin gabapentin 100 mg nightly for his neuropathic pain.     Ok Edwards NP [facility] Adult [facility]

## 2021-10-20 ENCOUNTER — Non-Acute Institutional Stay (SKILLED_NURSING_FACILITY): Payer: Medicare HMO | Admitting: Adult Health

## 2021-10-20 ENCOUNTER — Encounter: Payer: Self-pay | Admitting: Adult Health

## 2021-10-20 DIAGNOSIS — I4821 Permanent atrial fibrillation: Secondary | ICD-10-CM | POA: Diagnosis not present

## 2021-10-20 DIAGNOSIS — I700 Atherosclerosis of aorta: Secondary | ICD-10-CM | POA: Diagnosis not present

## 2021-10-20 DIAGNOSIS — F339 Major depressive disorder, recurrent, unspecified: Secondary | ICD-10-CM

## 2021-10-20 DIAGNOSIS — S72001A Fracture of unspecified part of neck of right femur, initial encounter for closed fracture: Secondary | ICD-10-CM

## 2021-10-20 NOTE — Progress Notes (Signed)
Location:  [place] Room Number: 188-C Place of Service:  SNF (31)  Provider: Ok Edwards np   PCP: Monico Blitz, MD Patient Care Team: Monico Blitz, MD as PCP - General (Internal Medicine) Satira Sark, MD as PCP - Cardiology (Cardiology)  Extended Emergency Contact Information Primary Emergency Contact: [place] Mobile Phone: [phone] Relation: Nephew  Code Status: full  Goals of care:  Advanced Directive information    10/20/2021   11:00 AM  Advanced Directives  Does Patient Have a Medical Advance Directive? No  Does patient want to make changes to medical advance directive? No - Patient declined     No Known Allergies  Chief Complaint  Patient presents with   Discharge Note    HPI:  81 y.o. male  being discharged to home with home health. He will need a rolling walker. He will need his prescriptions written and will need to follow up with his medical provider. He had been hospitalized for a right hip fracture status post hemiarthroplasty. He was admitted to this facility for short term rehab. He has participated in both pt and ot to improve upon his level of independence with his adls. He is now ready to complete therapy on a home health basis     Past Medical History:  Diagnosis Date   Anxiety    Asthmatic bronchitis    Atrial fibrillation (HCC)    Backache    Colon polyps    COPD (chronic obstructive pulmonary disease) ([place])    Diverticulitis 12/2019   Esophageal reflux    Essential hypertension    Gout    Hemorrhoid    Hyperlipidemia    Insomnia    Ischemic heart disease    MI (myocardial infarction) ([place]) 2003   Neck pain    Plantar fasciitis    Prostate cancer [facility])     Past Surgical History:  Procedure Laterality Date   COLONOSCOPY     COLONOSCOPY N/A 11/15/2016   Procedure: COLONOSCOPY;  Surgeon: Rogene Houston, MD;  [facility];  Service: Endoscopy;  Laterality: N/A;  1200    COLONOSCOPY WITH PROPOFOL N/A 01/20/2020   Procedure: COLONOSCOPY WITH PROPOFOL;  Surgeon: Rogene Houston, MD;  [facility];  Service: Endoscopy;  Laterality: N/A;  1:15, pt refused to change time   CORONARY ANGIOPLASTY WITH STENT PLACEMENT  2007   4 stents placed   POLYPECTOMY  11/15/2016   Procedure: POLYPECTOMY;  Surgeon: Rogene Houston, MD;  [facility];  Service: Endoscopy;;  colon   POLYPECTOMY  01/20/2020   Procedure: POLYPECTOMY;  Surgeon: Rogene Houston, MD;  [facility];  Service: Endoscopy;;   PROSTATECTOMY  2001   TOTAL HIP ARTHROPLASTY Right 10/06/2021   Procedure: TOTAL HIP ARTHROPLASTY ANTERIOR APPROACH;  Surgeon: Leandrew Koyanagi, MD;  Location: [place];  Service: Orthopedics;  Laterality: Right;      reports that he quit smoking about 30 years ago. His smoking use included cigarettes. He has a 2.25 pack-year smoking history. He has never used smokeless tobacco. He reports that he does not drink alcohol and does not use drugs. Social History   Socioeconomic History   Marital status: Single    Spouse name: Not on file   Number of children: Not on file   Years of education: Not on file   Highest education level: Not on file  Occupational History   Not on file  Tobacco Use  Smoking status: Former    Packs/day: 0.75    Years: 3.00    Total pack years: 2.25    Types: Cigarettes    Quit date: 03/06/1991    Years since quitting: 30.6   Smokeless tobacco: Never  Vaping Use   Vaping Use: Never used  Substance and Sexual Activity   Alcohol use: No   Drug use: No   Sexual activity: Not on file  Other Topics Concern   Not on file  Social History Narrative   Not on file   Social Determinants of Health   Financial Resource Strain: Not on file  Food Insecurity: Not on file  Transportation Needs: Not on file  Physical Activity: Not on file  Stress: Not on file  Social Connections: Not on file  Intimate Partner Violence: Not on file    Functional Status Survey:    No Known Allergies  Pertinent  Health Maintenance Due  Topic Date Due   INFLUENZA VACCINE  10/03/2021    Medications: Outpatient Encounter Medications as of 10/20/2021  Medication Sig   acetaminophen (TYLENOL) 325 MG tablet Take 650 mg by mouth every 6 (six) hours. Give tylenol cr 650 mg every 6 hours routine   acetaminophen (TYLENOL) 500 MG tablet Take 500 mg by mouth. As Needed daily between scheduled doses x1 for pain   alfuzosin (UROXATRAL) 10 MG 24 hr tablet Take 10 mg by mouth daily.   allopurinol (ZYLOPRIM) 300 MG tablet Take 300 mg by mouth daily.    apixaban (ELIQUIS) 5 MG TABS tablet Take 1 tablet (5 mg total) by mouth 2 (two) times daily.   citalopram (CELEXA) 20 MG tablet Take 20 mg by mouth daily.   clonazePAM (KLONOPIN) 0.5 MG tablet Take 1 tablet (0.5 mg total) by mouth 2 (two) times daily.   diltiazem (CARDIZEM CD) 240 MG 24 hr capsule Take 1 capsule (240 mg total) by mouth daily.   docusate sodium (COLACE) 100 MG capsule Take 1 capsule (100 mg total) by mouth 2 (two) times daily.   Elastic Bandages & Supports (MEDICAL COMPRESSION STOCKINGS) MISC 1 each by Does not apply route as directed. 1 pair of low pressure below the knee compression stockings Dx: mild orthostatic hypotension   fluticasone-salmeterol (WIXELA INHUB) 100-50 MCG/ACT AEPB Inhale 1 puff into the lungs 2 (two) times daily. Rinse mouth and spit after use. Wait at least 5 minutes between use of multiple inhalers.   gabapentin (NEURONTIN) 100 MG capsule Take 100 mg by mouth at bedtime. Right lower leg neuropathic pain   magnesium hydroxide (MILK OF MAGNESIA) 400 MG/5ML suspension Take 15 mLs by mouth at bedtime as needed for mild constipation.   montelukast (SINGULAIR) 10 MG tablet Take 10 mg by mouth daily.   Nutritional Supplements (ENSURE ENLIVE PO) Take by mouth. Twice A Day Between Meals due to increased energy /protein needs follwing surgery   polyethylene glycol (MIRALAX  / GLYCOLAX) 17 g packet Take 17 g by mouth daily as needed.   [DISCONTINUED] atorvastatin (LIPITOR) 20 MG tablet Take 20 mg by mouth at bedtime.    [DISCONTINUED] budesonide-formoterol (SYMBICORT) 80-4.5 MCG/ACT inhaler Inhale 2 puffs into the lungs 2 (two) times daily.   [DISCONTINUED] psyllium (METAMUCIL SMOOTH TEXTURE) 58.6 % powder Take 1 packet by mouth daily.   [DISCONTINUED] sertraline (ZOLOFT) 100 MG tablet Take 100 mg by mouth daily as needed (anxiety).   No facility-administered encounter medications on file as of 10/20/2021.    Vitals:   10/20/21 1052  BP:  120/68  Pulse: 68  Resp: 20  Temp: (!) 97.1 F (36.2 C)  SpO2: 95%  Weight: 147 lb (66.7 kg)  Height: '5\' 8"'$  (1.727 m)   Body mass index is 22.35 kg/m.   SIGNIFICANT DIAGNOSTIC EXAMS  PREVIOUS   09-30-21; cxr 1. No acute cardiopulmonary disease. 2. Atherosclerosis.  10-03-21: myoview 1. Fixed defect in the inferior wall consistent with scar versus diaphragmatic attenuation. No wall motion abnormality favors attenuation. 2. Normal left ventricular wall motion. 3. Left ventricular ejection fraction 69% 4. Non invasive risk stratification*: Low  NO NEW EXAMS   LABS REVIEWED: PREVIOUS   09-30-21: wbc 10.8; hgb 14.0; hct 42.1; mcv 93.6 plt 219; glucose 109; bun 26; creat 1.17; k+ 3.9; na++ 140; ca 9.4; gfr >60; ck 1581 10-02-21: wbc 8.5; hgb 12.7; hct 39.0; mcv 996.3 plt 220; glucose 111; bun 28; creat 1.13; k+ 4.2; na++ 141; ca 9.1 gfr >60; phos 3.0 albumin 3.7 10-06-21: wbc 5.6; hgb 13.7; hct 39.7; mcv 91.5 plt 237; glucose 111; bun 19; creat 1.00 ;k+ 3.9; na++ 138; ca 8.9; gfr >60; protein 5.7 albumin 3.2; ck 44 10-08-21: wbc 7.9; hgb 12.4; hct 36.2; mcv 91.4 plt 231; glucose 102; bun 21; creat 1.14; k+ 4.2; na++ 137; ca 8.9; gfr >60 10-09-21: wbc 5.8; hgb 12.8; hct 36.9; mcv 92.0 plt 240  NO NEW LABS.   Review of Systems  Constitutional:  Negative for malaise/fatigue.  Respiratory:  Negative for cough and shortness  of breath.   Cardiovascular:  Negative for chest pain, palpitations and leg swelling.  Gastrointestinal:  Negative for abdominal pain, constipation and heartburn.  Musculoskeletal:  Positive for joint pain. Negative for back pain and myalgias.       Right hip   Skin: Negative.   Neurological:  Negative for dizziness.  Psychiatric/Behavioral:  The patient is not nervous/anxious.    Physical Exam Constitutional:      General: He is not in acute distress.    Appearance: He is well-developed. He is not diaphoretic.  Neck:     Thyroid: No thyromegaly.  Cardiovascular:     Rate and Rhythm: Normal rate and regular rhythm.     Pulses: Normal pulses.     Heart sounds: Murmur heard.  Pulmonary:     Effort: Pulmonary effort is normal. No respiratory distress.     Breath sounds: Normal breath sounds.  Abdominal:     General: Bowel sounds are normal. There is no distension.     Palpations: Abdomen is soft.     Tenderness: There is no abdominal tenderness.  Musculoskeletal:     Cervical back: Neck supple.     Right lower leg: No edema.     Left lower leg: No edema.     Comments:  Is able to move all extremities  Status post right hip arthroplasty     Lymphadenopathy:     Cervical: No cervical adenopathy.  Skin:    General: Skin is warm and dry.  Neurological:     Mental Status: He is alert and oriented to person, place, and time.  Psychiatric:        Mood and Affect: Mood normal.      Assessment/Plan:    Patient is being discharged with the following home health services:  pt/ot to evaluate and treat as indicated for gait balance strength adl training   Patient is being discharged with the following durable medical equipment:  rolling walker   Patient has been advised to f/u with their  PCP in 1-2 weeks to for a transitions of care visit.  Social services at their facility was responsible for arranging this appointment.  Pt was provided with adequate prescriptions of noncontrolled  medications to reach the scheduled appointment .  For controlled substances, a limited supply was provided as appropriate for the individual patient.  If the pt normally receives these medications from a pain clinic or has a contract with another physician, these medications should be received from that clinic or physician only).    A 30 day supply of his prescription medications have been sent to [facility]   Time spend with patient: 35 minutes: medications; dme; home health.    Ok Edwards NP [facility] Adult Medicine  call [phone]

## 2021-10-24 ENCOUNTER — Ambulatory Visit (INDEPENDENT_AMBULATORY_CARE_PROVIDER_SITE_OTHER): Payer: Medicare HMO | Admitting: Orthopaedic Surgery

## 2021-10-24 DIAGNOSIS — S72001A Fracture of unspecified part of neck of right femur, initial encounter for closed fracture: Secondary | ICD-10-CM

## 2021-10-24 NOTE — Progress Notes (Unsigned)
Post-Op Visit Note   Patient: Jackson Martin           Date of Birth: Jan 21, 1941           MRN: [id number] Visit Date: 10/24/2021 PCP: Monico Blitz, MD   Assessment & Plan:  Chief Complaint: No chief complaint on file.  Visit Diagnoses:  1. Displaced fracture of right femoral neck (HCC)     Plan: ***  Follow-Up Instructions: No follow-ups on file.   Orders:  No orders of the defined types were placed in this encounter.  No orders of the defined types were placed in this encounter.   Imaging: No results found.  PMFS History: Patient Active Problem List   Diagnosis Date Noted   Unspecified protein-calorie malnutrition ([place]) 10/12/2021   Aortic atherosclerosis ([place]) 10/10/2021   CAD (coronary artery disease) 10/10/2021   Chronic gout 10/10/2021   Depression, recurrent ([place]) 10/10/2021   BPH without urinary obstruction 10/10/2021   Acute postoperative anemia due to expected blood loss 10/09/2021   Displaced fracture of right femoral neck ([place]) 09/30/2021   Anxiety 09/30/2021   Atrial fibrillation ([place]) 09/30/2021   Essential hypertension 09/30/2021   Hyperlipidemia LDL goal <100 09/30/2021   Elevated alkaline phosphatase level 01/31/2021   Chronic constipation 05/31/2020   Ulcerated external hemorrhoids 05/31/2020   Diverticulitis 09/02/2019   Blood in stool 09/23/2017   Dyspnea on exertion 12/25/2016   COPD  GOLD 0/ ? AB 12/25/2016   Gastrointestinal hemorrhage 10/29/2016   History of MI (myocardial infarction) 2003   Past Medical History:  Diagnosis Date   Anxiety    Asthmatic bronchitis    Atrial fibrillation (HCC)    Backache    Colon polyps    COPD (chronic obstructive pulmonary disease) ([place])    Diverticulitis 12/2019   Esophageal reflux    Essential hypertension    Gout    Hemorrhoid    Hyperlipidemia    Insomnia    Ischemic heart disease    MI (myocardial infarction) ([place]) 2003   Neck pain    Plantar fasciitis    Prostate cancer ([place])      Family History  Problem Relation Age of Onset   Hypertension Mother    Heart disease Mother    Heart disease Maternal Aunt    Heart disease Maternal Uncle    Heart disease Maternal Grandmother    Heart disease Maternal Grandfather    Colon cancer Brother    Breast cancer Sister    Lung cancer Sister    Lung disease Sister     Past Surgical History:  Procedure Laterality Date   COLONOSCOPY     COLONOSCOPY N/A 11/15/2016   Procedure: COLONOSCOPY;  Surgeon: Rogene Houston, MD;  [facility];  Service: Endoscopy;  Laterality: N/A;  1200   COLONOSCOPY WITH PROPOFOL N/A 01/20/2020   Procedure: COLONOSCOPY WITH PROPOFOL;  Surgeon: Rogene Houston, MD;  [facility];  Service: Endoscopy;  Laterality: N/A;  1:15, pt refused to change time   CORONARY ANGIOPLASTY WITH STENT PLACEMENT  2007   4 stents placed   POLYPECTOMY  11/15/2016   Procedure: POLYPECTOMY;  Surgeon: Rogene Houston, MD;  [facility];  Service: Endoscopy;;  colon   POLYPECTOMY  01/20/2020   Procedure: POLYPECTOMY;  Surgeon: Rogene Houston, MD;  [facility];  Service: Endoscopy;;   PROSTATECTOMY  2001   TOTAL HIP ARTHROPLASTY Right 10/06/2021   Procedure: TOTAL HIP ARTHROPLASTY ANTERIOR APPROACH;  Surgeon: Leandrew Koyanagi, MD;  Location: [place];  Service: Orthopedics;  Laterality: Right;   Social History   Occupational History   Not on file  Tobacco Use   Smoking status: Former    Packs/day: 0.75    Years: 3.00    Total pack years: 2.25    Types: Cigarettes    Quit date: 03/06/1991    Years since quitting: 30.6   Smokeless tobacco: Never  Vaping Use   Vaping Use: Never used  Substance and Sexual Activity   Alcohol use: No   Drug use: No   Sexual activity: Not on file

## 2021-10-25 NOTE — Progress Notes (Signed)
No show

## 2021-11-02 ENCOUNTER — Encounter: Payer: Self-pay | Admitting: Orthopaedic Surgery

## 2021-11-02 ENCOUNTER — Ambulatory Visit (INDEPENDENT_AMBULATORY_CARE_PROVIDER_SITE_OTHER): Payer: Medicare HMO | Admitting: Physician Assistant

## 2021-11-02 ENCOUNTER — Ambulatory Visit (INDEPENDENT_AMBULATORY_CARE_PROVIDER_SITE_OTHER): Payer: Medicare HMO

## 2021-11-02 DIAGNOSIS — S72001A Fracture of unspecified part of neck of right femur, initial encounter for closed fracture: Secondary | ICD-10-CM

## 2021-11-02 DIAGNOSIS — Z96641 Presence of right artificial hip joint: Secondary | ICD-10-CM

## 2021-11-02 MED ORDER — TRAMADOL HCL 50 MG PO TABS: 50.0000 mg | ORAL_TABLET | Freq: Three times a day (TID) | ORAL | 2 refills | Status: DC | PRN

## 2021-11-02 NOTE — Progress Notes (Signed)
Post-Op Visit Note   Patient: Jackson Martin           Date of Birth: 06/21/1940           MRN: [id number] Visit Date: 11/02/2021 PCP: Monico Blitz, MD   Assessment & Plan:  Chief Complaint:  Chief Complaint  Patient presents with   Right Hip - Follow-up    Right total hip arthroplasty 10/06/2021   Visit Diagnoses:  1. Displaced fracture of right femoral neck ([place])   2. History of right hip replacement     Plan: Patient is a pleasant 81 year old gentleman who comes in today 4 weeks status post right total hip replacement for femoral neck fracture, date of surgery 10/06/2021.  He has been doing well.  He initially went to a skilled nursing facility but notes he has been home for the past week.  He is getting home health physical therapy and is ambulating with a walker.  He does note he is ambulating without assistance prior to his injury.  He notes soreness to the anterior thigh which is slightly relieved with Tylenol.  Examination of his right hip reveals a fully healed surgical scar with nylon sutures in place.  No evidence of infection or cellulitis.  Calf soft nontender.  He is neurovascular tact distally.  Today, sutures were removed and Steri-Strips applied.  He will follow-up with [place] in 4 weeks for repeat evaluation AP pelvis x-rays.  Call with concerns or questions.  Follow-Up Instructions: Return in about 4 weeks (around 11/30/2021).   Orders:  Orders Placed This Encounter  Procedures   XR HIP UNILAT W OR W/O PELVIS 2-3 VIEWS RIGHT   Meds ordered this encounter  Medications   traMADol (ULTRAM) 50 MG tablet    Sig: Take 1 tablet (50 mg total) by mouth 3 (three) times daily as needed.    Dispense:  60 tablet    Refill:  2    Imaging: XR HIP UNILAT W OR W/O PELVIS 2-3 VIEWS RIGHT  Result Date: 11/02/2021 Well-seated prosthesis without complication   PMFS History: Patient Active Problem List   Diagnosis Date Noted   Unspecified protein-calorie malnutrition ([place])  10/12/2021   Aortic atherosclerosis ([place]) 10/10/2021   CAD (coronary artery disease) 10/10/2021   Chronic gout 10/10/2021   Depression, recurrent ([place]) 10/10/2021   BPH without urinary obstruction 10/10/2021   Acute postoperative anemia due to expected blood loss 10/09/2021   Displaced fracture of right femoral neck ([place]) 09/30/2021   Anxiety 09/30/2021   Atrial fibrillation ([place]) 09/30/2021   Essential hypertension 09/30/2021   Hyperlipidemia LDL goal <100 09/30/2021   Elevated alkaline phosphatase level 01/31/2021   Chronic constipation 05/31/2020   Ulcerated external hemorrhoids 05/31/2020   Diverticulitis 09/02/2019   Blood in stool 09/23/2017   Dyspnea on exertion 12/25/2016   COPD  GOLD 0/ ? AB 12/25/2016   Gastrointestinal hemorrhage 10/29/2016   History of MI (myocardial infarction) 2003   Past Medical History:  Diagnosis Date   Anxiety    Asthmatic bronchitis    Atrial fibrillation (HCC)    Backache    Colon polyps    COPD (chronic obstructive pulmonary disease) ([place])    Diverticulitis 12/2019   Esophageal reflux    Essential hypertension    Gout    Hemorrhoid    Hyperlipidemia    Insomnia    Ischemic heart disease    MI (myocardial infarction) ([place]) 2003   Neck pain    Plantar fasciitis  Prostate cancer ([place])     Family History  Problem Relation Age of Onset   Hypertension Mother    Heart disease Mother    Heart disease Maternal Aunt    Heart disease Maternal Uncle    Heart disease Maternal Grandmother    Heart disease Maternal Grandfather    Colon cancer Brother    Breast cancer Sister    Lung cancer Sister    Lung disease Sister     Past Surgical History:  Procedure Laterality Date   COLONOSCOPY     COLONOSCOPY N/A 11/15/2016   Procedure: COLONOSCOPY;  Surgeon: Rogene Houston, MD;  [facility];  Service: Endoscopy;  Laterality: N/A;  1200   COLONOSCOPY WITH PROPOFOL N/A 01/20/2020   Procedure: COLONOSCOPY WITH PROPOFOL;   Surgeon: Rogene Houston, MD;  [facility];  Service: Endoscopy;  Laterality: N/A;  1:15, pt refused to change time   CORONARY ANGIOPLASTY WITH STENT PLACEMENT  2007   4 stents placed   POLYPECTOMY  11/15/2016   Procedure: POLYPECTOMY;  Surgeon: Rogene Houston, MD;  [facility];  Service: Endoscopy;;  colon   POLYPECTOMY  01/20/2020   Procedure: POLYPECTOMY;  Surgeon: Rogene Houston, MD;  [facility];  Service: Endoscopy;;   PROSTATECTOMY  2001   TOTAL HIP ARTHROPLASTY Right 10/06/2021   Procedure: TOTAL HIP ARTHROPLASTY ANTERIOR APPROACH;  Surgeon: Leandrew Koyanagi, MD;  Location: [facility];  Service: Orthopedics;  Laterality: Right;   Social History   Occupational History   Not on file  Tobacco Use   Smoking status: Former    Packs/day: 0.75    Years: 3.00    Total pack years: 2.25    Types: Cigarettes    Quit date: 03/06/1991    Years since quitting: 30.6   Smokeless tobacco: Never  Vaping Use   Vaping Use: Never used  Substance and Sexual Activity   Alcohol use: No   Drug use: No   Sexual activity: Not on file

## 2021-11-07 DIAGNOSIS — I10 Essential (primary) hypertension: Secondary | ICD-10-CM | POA: Diagnosis not present

## 2021-11-07 DIAGNOSIS — J449 Chronic obstructive pulmonary disease, unspecified: Secondary | ICD-10-CM | POA: Diagnosis not present

## 2021-11-07 DIAGNOSIS — Z96641 Presence of right artificial hip joint: Secondary | ICD-10-CM | POA: Diagnosis not present

## 2021-11-07 DIAGNOSIS — S72001D Fracture of unspecified part of neck of right femur, subsequent encounter for closed fracture with routine healing: Secondary | ICD-10-CM | POA: Diagnosis not present

## 2021-11-10 ENCOUNTER — Encounter: Payer: Self-pay | Admitting: Cardiology

## 2021-11-10 ENCOUNTER — Ambulatory Visit: Payer: Medicare HMO | Attending: Cardiology | Admitting: Cardiology

## 2021-11-10 VITALS — BP 118/66 | HR 80 | Ht 69.0 in | Wt 145.8 lb

## 2021-11-10 DIAGNOSIS — I350 Nonrheumatic aortic (valve) stenosis: Secondary | ICD-10-CM | POA: Diagnosis not present

## 2021-11-10 DIAGNOSIS — I10 Essential (primary) hypertension: Secondary | ICD-10-CM | POA: Diagnosis not present

## 2021-11-10 DIAGNOSIS — I4819 Other persistent atrial fibrillation: Secondary | ICD-10-CM | POA: Diagnosis not present

## 2021-11-10 NOTE — Patient Instructions (Signed)

## 2021-11-10 NOTE — Progress Notes (Signed)
Cardiology Office Note  Date: 11/10/2021   ID: Jackson Martin, DOB 05/26/1940, MRN [id number]  PCP:  Monico Blitz, MD  Cardiologist:  Rozann Lesches, MD Electrophysiologist:  None   Chief Complaint  Patient presents with   Cardiac follow-up    History of Present Illness: Jackson Martin is an 81 y.o. male last seen in November 2022.  He presents for a posthospital visit, I reviewed records.  He was managed following a fall for right hip fracture in early August.  Presentation complicated by rapid atrial fibrillation which converted spontaneously to sinus rhythm with rate control.  He was seen in consultation by cardiology and underwent additional cardiac restratification prior to hip surgery.  Echocardiogram and Myoview results were reassuring and noted below.  He did have evidence of mild to moderate aortic stenosis.  Ultimately underwent right THR on August 4.  He is here for follow-up.  Using a walker today.  States that he still has home PT 3 days a week.  Getting better gradually but still has pain in his right thigh.  He does not report any palpitations or exertional chest pain.  We did discuss his aortic stenosis which at this point is asymptomatic and we will continue to follow over time.  We went over his medications, he is tolerating Eliquis and remains on Cardizem CD.  Heart rate is controlled at rest today.  Past Medical History:  Diagnosis Date   Anxiety    Asthmatic bronchitis    Atrial fibrillation (HCC)    Backache    Colon polyps    COPD (chronic obstructive pulmonary disease) ([place])    Diverticulitis 12/2019   Esophageal reflux    Essential hypertension    Gout    Hemorrhoid    Hyperlipidemia    Insomnia    Ischemic heart disease    MI (myocardial infarction) ([place]) 2003   Neck pain    Plantar fasciitis    Prostate cancer [facility])     Past Surgical History:  Procedure Laterality Date   COLONOSCOPY     COLONOSCOPY N/A 11/15/2016   Procedure:  COLONOSCOPY;  Surgeon: Rogene Houston, MD;  [facility];  Service: Endoscopy;  Laterality: N/A;  1200   COLONOSCOPY WITH PROPOFOL N/A 01/20/2020   Procedure: COLONOSCOPY WITH PROPOFOL;  Surgeon: Rogene Houston, MD;  [facility];  Service: Endoscopy;  Laterality: N/A;  1:15, pt refused to change time   CORONARY ANGIOPLASTY WITH STENT PLACEMENT  2007   4 stents placed   POLYPECTOMY  11/15/2016   Procedure: POLYPECTOMY;  Surgeon: Rogene Houston, MD;  [facility];  Service: Endoscopy;;  colon   POLYPECTOMY  01/20/2020   Procedure: POLYPECTOMY;  Surgeon: Rogene Houston, MD;  [facility];  Service: Endoscopy;;   PROSTATECTOMY  2001   TOTAL HIP ARTHROPLASTY Right 10/06/2021   Procedure: TOTAL HIP ARTHROPLASTY ANTERIOR APPROACH;  Surgeon: Leandrew Koyanagi, MD;  Location: [place];  Service: Orthopedics;  Laterality: Right;    Current Outpatient Medications  Medication Sig Dispense Refill   acetaminophen (TYLENOL) 325 MG tablet Take 650 mg by mouth every 6 (six) hours. Give tylenol cr 650 mg every 6 hours routine     acetaminophen (TYLENOL) 500 MG tablet Take 500 mg by mouth. As Needed daily between scheduled doses x1 for pain     alfuzosin (UROXATRAL) 10 MG 24 hr tablet Take 10 mg by mouth daily.     allopurinol (  ZYLOPRIM) 300 MG tablet Take 300 mg by mouth daily.      apixaban (ELIQUIS) 5 MG TABS tablet Take 1 tablet (5 mg total) by mouth 2 (two) times daily. 56 tablet 0   citalopram (CELEXA) 20 MG tablet Take 20 mg by mouth daily.     clonazePAM (KLONOPIN) 0.5 MG tablet Take 1 tablet (0.5 mg total) by mouth 2 (two) times daily. 60 tablet 0   diltiazem (CARDIZEM CD) 240 MG 24 hr capsule Take 1 capsule (240 mg total) by mouth daily. 90 capsule 3   docusate sodium (COLACE) 100 MG capsule Take 1 capsule (100 mg total) by mouth 2 (two) times daily.  0   Elastic Bandages & Supports (MEDICAL COMPRESSION STOCKINGS) MISC 1 each by Does not apply route as  directed. 1 pair of low pressure below the knee compression stockings Dx: mild orthostatic hypotension 2 each 0   fluticasone-salmeterol (WIXELA INHUB) 100-50 MCG/ACT AEPB Inhale 1 puff into the lungs 2 (two) times daily. Rinse mouth and spit after use. Wait at least 5 minutes between use of multiple inhalers.     gabapentin (NEURONTIN) 100 MG capsule Take 100 mg by mouth at bedtime. Right lower leg neuropathic pain     magnesium hydroxide (MILK OF MAGNESIA) 400 MG/5ML suspension Take 15 mLs by mouth at bedtime as needed for mild constipation.     montelukast (SINGULAIR) 10 MG tablet Take 10 mg by mouth daily.     Nutritional Supplements (ENSURE ENLIVE PO) Take by mouth. Twice A Day Between Meals due to increased energy /protein needs follwing surgery     polyethylene glycol (MIRALAX / GLYCOLAX) 17 g packet Take 17 g by mouth daily as needed.     traMADol (ULTRAM) 50 MG tablet Take 1 tablet (50 mg total) by mouth 3 (three) times daily as needed. 60 tablet 2   No current facility-administered medications for this visit.   Allergies:  Patient has no known allergies.   ROS: No dizziness or syncope.  Physical Exam: VS:  BP 118/66   Pulse 80   Ht '5\' 9"'$  (1.753 m)   Wt 145 lb 12.8 oz (66.1 kg)   SpO2 98%   BMI 21.53 kg/m , BMI Body mass index is 21.53 kg/m.  Wt Readings from Last 3 Encounters:  11/10/21 145 lb 12.8 oz (66.1 kg)  10/20/21 147 lb (66.7 kg)  10/16/21 147 lb 9.6 oz (67 kg)    General: Patient appears comfortable at rest. HEENT: Conjunctiva and lids normal. Neck: Supple, no elevated JVP or carotid bruits, no thyromegaly. Lungs: Clear to auscultation, nonlabored breathing at rest. Cardiac: Irregularly irregular, no S3, 3/6 systolic murmur, no pericardial rub. Extremities: No pitting edema.  ECG:  An ECG dated 09/30/2021 was personally reviewed today and demonstrated:  Atrial fibrillation with IVCD, occasional PVCs and diffuse nonspecific ST-T changes.  Recent  Labwork: 10/06/2021: ALT 12; AST 16 10/08/2021: BUN 21; Creatinine, Ser 1.14; Potassium 4.2; Sodium 137 10/09/2021: Hemoglobin 12.8; Platelets 240   Other Studies Reviewed Today:  Echocardiogram 10/02/2021:  1. Left ventricular ejection fraction, by estimation, is 60 to 65%. The  left ventricle has normal function. The left ventricle has no regional  wall motion abnormalities. Left ventricular diastolic parameters are  indeterminate.   2. Right ventricular systolic function is normal. The right ventricular  size is mildly enlarged. There is normal pulmonary artery systolic  pressure.   3. Left atrial size was moderately dilated.   4. Right atrial size was mildly  dilated.   5. The mitral valve is normal in structure. Mild mitral valve  regurgitation.   6. AV is thickened, calcified with restrticted motion. Peak and mean  gradients through the valve are 27 and 12 mm Hg respectviely AVA (VTI) is  1.43 cm2 . Dimensionless index is 0.39 Overall, all consistent with mild  to moderate AS. [place] The aortic valve is   tricuspid. Aortic valve regurgitation is not visualized.   7. The inferior vena cava is dilated in size with >50% respiratory  variability, suggesting right atrial pressure of 8 mmHg.   Lexiscan Myoview 10/03/2021: IMPRESSION: 1. Fixed defect in the inferior wall consistent with scar versus diaphragmatic attenuation. No wall motion abnormality favors attenuation.   2. Normal left ventricular wall motion.   3. Left ventricular ejection fraction 69%   4. Non invasive risk stratification*: Low  Assessment and Plan:  1.  Permanent atrial fibrillation with CHA2DS2-VASc score of 4.  Heart rate is controlled at this time and we will plan to continue Cardizem CD at present dose.  Continue Eliquis for stroke prophylaxis.  I reviewed his recent lab work.  2.  Mild to moderate calcific aortic stenosis documented by echocardiogram in July.  We will continue to follow this over time.  3.   Essential hypertension, blood pressure is well controlled today.  No changes in current regimen.  Medication Adjustments/Labs and Tests Ordered: Current medicines are reviewed at length with the patient today.  Concerns regarding medicines are outlined above.   Tests Ordered: No orders of the defined types were placed in this encounter.   Medication Changes: No orders of the defined types were placed in this encounter.   Disposition:  Follow up  6 months.  Signed, Satira Sark, MD, [facility] 11/10/2021 12:16 PM    [facility] [place], [place], [place] [address] Phone: [phone]; Fax: [phone]

## 2021-11-23 ENCOUNTER — Telehealth: Payer: Self-pay | Admitting: Cardiology

## 2021-11-23 NOTE — Telephone Encounter (Signed)
Reports chest pain x's 1 2 weeks ago rated 3/10. Denies dizziness or SOB. Denies active chest pain. Says he is doing well. Advised to contact our office if his symptom reoccur. Verbalized understanding of plan.

## 2021-11-23 NOTE — Telephone Encounter (Signed)
The nurse called to say that when she spoke to the patient earlier he complain bout tightness in chest and the lack of being able to sleep. She advise the patient to keep a record of his bp. She states he was in distress when she spoke to him. Please advise   Nurse states you dont have necessary called her back, you can reach the patient direct.

## 2021-11-29 ENCOUNTER — Ambulatory Visit (INDEPENDENT_AMBULATORY_CARE_PROVIDER_SITE_OTHER): Payer: Medicare HMO | Admitting: Physician Assistant

## 2021-11-29 ENCOUNTER — Encounter: Payer: Self-pay | Admitting: Orthopaedic Surgery

## 2021-11-29 ENCOUNTER — Ambulatory Visit (INDEPENDENT_AMBULATORY_CARE_PROVIDER_SITE_OTHER): Payer: Medicare HMO

## 2021-11-29 DIAGNOSIS — S72001A Fracture of unspecified part of neck of right femur, initial encounter for closed fracture: Secondary | ICD-10-CM | POA: Diagnosis not present

## 2021-11-29 NOTE — Progress Notes (Signed)
Post-Op Visit Note   Patient: Jackson Martin           Date of Birth: [birth year]           MRN: [id number] Visit Date: 11/29/2021 PCP: Monico Blitz, MD   Assessment & Plan:  Chief Complaint:  Chief Complaint  Patient presents with   Right Hip - Follow-up    Right total hip arthroplasty 10/06/2021   Visit Diagnoses:  1. Displaced fracture of right femoral neck (HCC)     Plan: Patient is a pleasant 81 year old gentleman who comes in today 6 weeks status post right total hip replacement from a femoral neck fracture 10/06/2021.  He has slight soreness to the anterior thigh but otherwise feeling great.  He has been working on home exercises.  He does note that he frequently ambulates with a walker secondary to dizziness.  Examination of his right hip reveals painless hip flexion and logroll.  He is neurovascular intact distally.  At this point, he will continue with his home exercise program.  Follow-up with [place] in 6 weeks for recheck and repeat AP pelvis x-rays.  Call with concerns or questions.  Follow-Up Instructions: Return in about 6 weeks (around 01/10/2022).   Orders:  Orders Placed This Encounter  Procedures   XR Pelvis 1-2 Views   No orders of the defined types were placed in this encounter.   Imaging: No results found.  PMFS History: Patient Active Problem List   Diagnosis Date Noted   Unspecified protein-calorie malnutrition ([place]) 10/12/2021   Aortic atherosclerosis ([place]) 10/10/2021   CAD (coronary artery disease) 10/10/2021   Chronic gout 10/10/2021   Depression, recurrent ([place]) 10/10/2021   BPH without urinary obstruction 10/10/2021   Acute postoperative anemia due to expected blood loss 10/09/2021   Displaced fracture of right femoral neck ([place]) 09/30/2021   Anxiety 09/30/2021   Atrial fibrillation ([place]) 09/30/2021   Essential hypertension 09/30/2021   Hyperlipidemia LDL goal <100 09/30/2021   Elevated alkaline phosphatase level 01/31/2021   Chronic  constipation 05/31/2020   Ulcerated external hemorrhoids 05/31/2020   Diverticulitis 09/02/2019   Blood in stool 09/23/2017   Dyspnea on exertion 12/25/2016   COPD  GOLD 0/ ? AB 12/25/2016   Gastrointestinal hemorrhage 10/29/2016   History of MI (myocardial infarction) 2003   Past Medical History:  Diagnosis Date   Anxiety    Asthmatic bronchitis    Atrial fibrillation (HCC)    Backache    Colon polyps    COPD (chronic obstructive pulmonary disease) ([place])    Diverticulitis 12/2019   Esophageal reflux    Essential hypertension    Gout    Hemorrhoid    Hyperlipidemia    Insomnia    Ischemic heart disease    MI (myocardial infarction) ([place]) 2003   Neck pain    Plantar fasciitis    Prostate cancer ([place])     Family History  Problem Relation Age of Onset   Hypertension Mother    Heart disease Mother    Heart disease Maternal Aunt    Heart disease Maternal Uncle    Heart disease Maternal Grandmother    Heart disease Maternal Grandfather    Colon cancer Brother    Breast cancer Sister    Lung cancer Sister    Lung disease Sister     Past Surgical History:  Procedure Laterality Date   COLONOSCOPY     COLONOSCOPY N/A 11/15/2016   Procedure: COLONOSCOPY;  Surgeon: Rogene Houston, MD;  [facility];  Service: Endoscopy;  Laterality: N/A;  1200   COLONOSCOPY WITH PROPOFOL N/A 01/20/2020   Procedure: COLONOSCOPY WITH PROPOFOL;  Surgeon: Rogene Houston, MD;  [facility];  Service: Endoscopy;  Laterality: N/A;  1:15, pt refused to change time   CORONARY ANGIOPLASTY WITH STENT PLACEMENT  2007   4 stents placed   POLYPECTOMY  11/15/2016   Procedure: POLYPECTOMY;  Surgeon: Rogene Houston, MD;  [facility];  Service: Endoscopy;;  colon   POLYPECTOMY  01/20/2020   Procedure: POLYPECTOMY;  Surgeon: Rogene Houston, MD;  [facility];  Service: Endoscopy;;   PROSTATECTOMY  2001   TOTAL HIP ARTHROPLASTY Right 10/06/2021   Procedure:  TOTAL HIP ARTHROPLASTY ANTERIOR APPROACH;  Surgeon: Leandrew Koyanagi, MD;  Location: [facility];  Service: Orthopedics;  Laterality: Right;   Social History   Occupational History   Not on file  Tobacco Use   Smoking status: Former    Packs/day: 0.75    Years: 3.00    Total pack years: 2.25    Types: Cigarettes    Quit date: 03/06/1991    Years since quitting: 30.7   Smokeless tobacco: Never  Vaping Use   Vaping Use: Never used  Substance and Sexual Activity   Alcohol use: No   Drug use: No   Sexual activity: Not on file

## 2021-12-02 DIAGNOSIS — F419 Anxiety disorder, unspecified: Secondary | ICD-10-CM | POA: Diagnosis not present

## 2021-12-02 DIAGNOSIS — M109 Gout, unspecified: Secondary | ICD-10-CM | POA: Diagnosis not present

## 2021-12-04 ENCOUNTER — Telehealth: Payer: Self-pay | Admitting: Cardiology

## 2021-12-04 NOTE — Telephone Encounter (Signed)
Pt c/o swelling: STAT is pt has developed SOB within 24 hours  If swelling, where is the swelling located? LE  How much weight have you gained and in what time span? N/A  Have you gained 3 pounds in a day or 5 pounds in a week? N/A  Do you have a log of your daily weights (if so, list)? No   Are you currently taking a fluid pill? No   Are you currently SOB? No   Have you traveled recently? No

## 2021-12-04 NOTE — Telephone Encounter (Signed)
Reports bilateral swelling in both legs from knees down to feet and ankles that started one week ago. Denies pain or redness or drainage in LE. Denies SOB, chest pain, weight gain. Says he has dizziness that he's had since his hip replacement but is not any worse. Denies increased sodium or fluid intake. Offered to schedule appointment at office in [place] or [place] for a sooner appointment patient declined and says he would call back if he wanted to be seen. Offered first available with Domenic Polite in [place] on 12/26/21, patient declined and said he would call us back.

## 2021-12-08 DIAGNOSIS — M25551 Pain in right hip: Secondary | ICD-10-CM | POA: Diagnosis not present

## 2021-12-08 DIAGNOSIS — M25531 Pain in right wrist: Secondary | ICD-10-CM | POA: Diagnosis not present

## 2021-12-19 DIAGNOSIS — F4322 Adjustment disorder with anxiety: Secondary | ICD-10-CM | POA: Diagnosis not present

## 2021-12-26 ENCOUNTER — Telehealth: Payer: Self-pay | Admitting: Cardiology

## 2021-12-26 NOTE — Telephone Encounter (Signed)
Called, unable to leave message due to mailbox being full.

## 2021-12-26 NOTE — Telephone Encounter (Signed)
Patient states that he will be having a tooth extraction.

## 2021-12-26 NOTE — Telephone Encounter (Signed)
Patient was called and was upset stating that he did not understand why Dr. Domenic Polite could not just tell him yes or no to holding his Eliquis for 2 days prior to having a tooth extraction. Informed the patient that the dentist office needs to send [place] a request for surgical clearance and asked if he would like for me to reach out to them. Patient stated that he was not going to tell anybody the name of his dentist because once he told him that he would need to contact our office, the dentist laughed in his face and told him that he was not calling. Patient continued to repeat himself over and over again. I asked him if he would like to speak with the manager. Call was placed on hold in which patient then disconnected the call. Manager notified.

## 2021-12-26 NOTE — Telephone Encounter (Signed)
Pt came into [place] office wanting to speak with Dr. Domenic Polite about having the dental work done and holding his Eliquis.   Informed pt of our protocol for holding medications and that we would need something from the Dentist doing the procedure.   Tried to get Dentist name so we could call the office and pt refused to give [place] the name. Pt had a seat in the lobby and then left after refusing to give [place] any information.

## 2021-12-26 NOTE — Telephone Encounter (Signed)
Pt calling upset because he states he was told by Dr. Domenic Polite that he can hold his eliqis 2 days prior to any extraction. He states he went up to the [place] office to ask about holding it and he was told that he needed the dental office to send over a clearance. Informed him that is correct, the dental office needs to send over a clearance form first before he can hold his eliquis. Pt said his dentist laughed in his face after he told him and said he was not doing that.   Pt would like further explanation as to why he needs clearance to hold his eliquis, because "its just a simple yes or no"

## 2021-12-27 NOTE — Telephone Encounter (Signed)
2nd attempt, unable to leave msg, voicemail full

## 2021-12-28 NOTE — Telephone Encounter (Signed)
3 rd attempt, unable to leave message due to voicemail being full.

## 2021-12-29 ENCOUNTER — Telehealth: Payer: Self-pay

## 2021-12-29 NOTE — Telephone Encounter (Signed)
Unable to reach patient via telephone. Letter sent to address on file.

## 2022-01-02 DIAGNOSIS — I10 Essential (primary) hypertension: Secondary | ICD-10-CM | POA: Diagnosis not present

## 2022-01-02 DIAGNOSIS — I4891 Unspecified atrial fibrillation: Secondary | ICD-10-CM | POA: Diagnosis not present

## 2022-01-09 ENCOUNTER — Ambulatory Visit (INDEPENDENT_AMBULATORY_CARE_PROVIDER_SITE_OTHER): Payer: Medicare HMO | Admitting: Orthopaedic Surgery

## 2022-01-09 ENCOUNTER — Ambulatory Visit (INDEPENDENT_AMBULATORY_CARE_PROVIDER_SITE_OTHER): Payer: Medicare HMO

## 2022-01-09 ENCOUNTER — Encounter: Payer: Self-pay | Admitting: Orthopaedic Surgery

## 2022-01-09 DIAGNOSIS — S72001A Fracture of unspecified part of neck of right femur, initial encounter for closed fracture: Secondary | ICD-10-CM

## 2022-01-09 NOTE — Progress Notes (Signed)
Post-Op Visit Note   Patient: Jackson Martin           Date of Birth: 02/23/1941           MRN: [id number] Visit Date: 01/09/2022 PCP: Jackson Blitz, MD   Assessment & Plan:  Chief Complaint:  Chief Complaint  Patient presents with   Right Hip - Follow-up    Right total hip arthroplasty 10/06/2021   Visit Diagnoses:  1. Displaced fracture of right femoral neck ([place])     Plan: Jackson Martin is 3 months status post right total hip replacement on 10/06/2021 for femoral neck fracture.  He uses a single-point cane.  He is doing home health PT.  Complains of some soreness in the thigh and pain in the knee with increased activity.  Examination of the right hip shows a fully healed surgical scar.  Fluid painless range of motion.  No signs of infection.  X-rays demonstrate stable implant without any complications.  Jackson Martin is doing well overall from his hip replacement hip fracture.  He is pleased with the outcome.  He will continue with physical therapy.  At this point he can follow-up with [place] as needed.  Follow-Up Instructions: No follow-ups on file.   Orders:  Orders Placed This Encounter  Procedures   XR Pelvis 1-2 Views   No orders of the defined types were placed in this encounter.   Imaging: XR Pelvis 1-2 Views  Result Date: 01/09/2022 Stable total hip replacement without complications   PMFS History: Patient Active Problem List   Diagnosis Date Noted   Unspecified protein-calorie malnutrition ([place]) 10/12/2021   Aortic atherosclerosis ([place]) 10/10/2021   CAD (coronary artery disease) 10/10/2021   Chronic gout 10/10/2021   Depression, recurrent ([place]) 10/10/2021   BPH without urinary obstruction 10/10/2021   Acute postoperative anemia due to expected blood loss 10/09/2021   Displaced fracture of right femoral neck ([place]) 09/30/2021   Anxiety 09/30/2021   Atrial fibrillation ([place]) 09/30/2021   Essential hypertension 09/30/2021   Hyperlipidemia LDL goal <100 09/30/2021    Elevated alkaline phosphatase level 01/31/2021   Chronic constipation 05/31/2020   Ulcerated external hemorrhoids 05/31/2020   Diverticulitis 09/02/2019   Blood in stool 09/23/2017   Dyspnea on exertion 12/25/2016   COPD  GOLD 0/ ? AB 12/25/2016   Gastrointestinal hemorrhage 10/29/2016   History of MI (myocardial infarction) 2003   Past Medical History:  Diagnosis Date   Anxiety    Asthmatic bronchitis    Atrial fibrillation (HCC)    Backache    Colon polyps    COPD (chronic obstructive pulmonary disease) ([place])    Diverticulitis 12/2019   Esophageal reflux    Essential hypertension    Gout    Hemorrhoid    Hyperlipidemia    Insomnia    Ischemic heart disease    MI (myocardial infarction) ([place]) 2003   Neck pain    Plantar fasciitis    Prostate cancer ([place])     Family History  Problem Relation Age of Onset   Hypertension Mother    Heart disease Mother    Heart disease Maternal Aunt    Heart disease Maternal Uncle    Heart disease Maternal Grandmother    Heart disease Maternal Grandfather    Colon cancer Brother    Breast cancer Sister    Lung cancer Sister    Lung disease Sister     Past Surgical History:  Procedure Laterality Date   COLONOSCOPY     COLONOSCOPY  N/A 11/15/2016   Procedure: COLONOSCOPY;  Surgeon: Rogene Houston, MD;  [facility];  Service: Endoscopy;  Laterality: N/A;  1200   COLONOSCOPY WITH PROPOFOL N/A 01/20/2020   Procedure: COLONOSCOPY WITH PROPOFOL;  Surgeon: Rogene Houston, MD;  [facility];  Service: Endoscopy;  Laterality: N/A;  1:15, pt refused to change time   CORONARY ANGIOPLASTY WITH STENT PLACEMENT  2007   4 stents placed   POLYPECTOMY  11/15/2016   Procedure: POLYPECTOMY;  Surgeon: Rogene Houston, MD;  [facility];  Service: Endoscopy;;  colon   POLYPECTOMY  01/20/2020   Procedure: POLYPECTOMY;  Surgeon: Rogene Houston, MD;  [facility];  Service: Endoscopy;;   PROSTATECTOMY   2001   TOTAL HIP ARTHROPLASTY Right 10/06/2021   Procedure: TOTAL HIP ARTHROPLASTY ANTERIOR APPROACH;  Surgeon: Leandrew Koyanagi, MD;  Location: [facility];  Service: Orthopedics;  Laterality: Right;   Social History   Occupational History   Not on file  Tobacco Use   Smoking status: Former    Packs/day: 0.75    Years: 3.00    Total pack years: 2.25    Types: Cigarettes    Quit date: 03/06/1991    Years since quitting: 30.8   Smokeless tobacco: Never  Vaping Use   Vaping Use: Never used  Substance and Sexual Activity   Alcohol use: No   Drug use: No   Sexual activity: Not on file

## 2022-01-10 DIAGNOSIS — L988 Other specified disorders of the skin and subcutaneous tissue: Secondary | ICD-10-CM | POA: Diagnosis not present

## 2022-01-10 DIAGNOSIS — L309 Dermatitis, unspecified: Secondary | ICD-10-CM | POA: Diagnosis not present

## 2022-01-10 DIAGNOSIS — L98499 Non-pressure chronic ulcer of skin of other sites with unspecified severity: Secondary | ICD-10-CM | POA: Diagnosis not present

## 2022-01-10 DIAGNOSIS — D485 Neoplasm of uncertain behavior of skin: Secondary | ICD-10-CM | POA: Diagnosis not present

## 2022-01-12 ENCOUNTER — Ambulatory Visit: Payer: Medicare HMO | Admitting: Cardiology

## 2022-01-18 DIAGNOSIS — L309 Dermatitis, unspecified: Secondary | ICD-10-CM | POA: Diagnosis not present

## 2022-01-27 ENCOUNTER — Other Ambulatory Visit: Payer: Self-pay | Admitting: Cardiology

## 2022-02-05 ENCOUNTER — Telehealth: Payer: Self-pay | Admitting: Cardiology

## 2022-02-05 MED ORDER — APIXABAN 5 MG PO TABS: 5.0000 mg | ORAL_TABLET | Freq: Two times a day (BID) | ORAL | 0 refills | Status: DC

## 2022-02-05 NOTE — Telephone Encounter (Signed)
Eliquis samples available for pick up 

## 2022-02-05 NOTE — Telephone Encounter (Signed)
Patient calling the office for samples of medication:   1.  What medication and dosage are you requesting samples for?  apixaban (ELIQUIS) 5 MG TABS tablet   2.  Are you currently out of this medication? No - Has 1 week left

## 2022-03-06 DIAGNOSIS — Z6821 Body mass index (BMI) 21.0-21.9, adult: Secondary | ICD-10-CM | POA: Diagnosis not present

## 2022-03-06 DIAGNOSIS — I25118 Atherosclerotic heart disease of native coronary artery with other forms of angina pectoris: Secondary | ICD-10-CM | POA: Diagnosis not present

## 2022-03-06 DIAGNOSIS — I10 Essential (primary) hypertension: Secondary | ICD-10-CM | POA: Diagnosis not present

## 2022-03-06 DIAGNOSIS — R233 Spontaneous ecchymoses: Secondary | ICD-10-CM | POA: Diagnosis not present

## 2022-03-06 DIAGNOSIS — Z299 Encounter for prophylactic measures, unspecified: Secondary | ICD-10-CM | POA: Diagnosis not present

## 2022-03-06 DIAGNOSIS — F331 Major depressive disorder, recurrent, moderate: Secondary | ICD-10-CM | POA: Diagnosis not present

## 2022-03-06 DIAGNOSIS — R21 Rash and other nonspecific skin eruption: Secondary | ICD-10-CM | POA: Diagnosis not present

## 2022-03-22 DIAGNOSIS — Z7189 Other specified counseling: Secondary | ICD-10-CM | POA: Diagnosis not present

## 2022-03-22 DIAGNOSIS — Z79899 Other long term (current) drug therapy: Secondary | ICD-10-CM | POA: Diagnosis not present

## 2022-03-22 DIAGNOSIS — Z125 Encounter for screening for malignant neoplasm of prostate: Secondary | ICD-10-CM | POA: Diagnosis not present

## 2022-03-22 DIAGNOSIS — Z1331 Encounter for screening for depression: Secondary | ICD-10-CM | POA: Diagnosis not present

## 2022-03-22 DIAGNOSIS — E7800 Pure hypercholesterolemia, unspecified: Secondary | ICD-10-CM | POA: Diagnosis not present

## 2022-03-22 DIAGNOSIS — Z1339 Encounter for screening examination for other mental health and behavioral disorders: Secondary | ICD-10-CM | POA: Diagnosis not present

## 2022-03-22 DIAGNOSIS — Z299 Encounter for prophylactic measures, unspecified: Secondary | ICD-10-CM | POA: Diagnosis not present

## 2022-03-22 DIAGNOSIS — Z0000 Encounter for general adult medical examination without abnormal findings: Secondary | ICD-10-CM | POA: Diagnosis not present

## 2022-03-22 DIAGNOSIS — I10 Essential (primary) hypertension: Secondary | ICD-10-CM | POA: Diagnosis not present

## 2022-03-22 DIAGNOSIS — R5383 Other fatigue: Secondary | ICD-10-CM | POA: Diagnosis not present

## 2022-04-16 ENCOUNTER — Telehealth: Payer: Self-pay | Admitting: Cardiology

## 2022-04-16 DIAGNOSIS — D692 Other nonthrombocytopenic purpura: Secondary | ICD-10-CM | POA: Diagnosis not present

## 2022-04-16 DIAGNOSIS — Z299 Encounter for prophylactic measures, unspecified: Secondary | ICD-10-CM | POA: Diagnosis not present

## 2022-04-16 DIAGNOSIS — I10 Essential (primary) hypertension: Secondary | ICD-10-CM | POA: Diagnosis not present

## 2022-04-16 DIAGNOSIS — I4891 Unspecified atrial fibrillation: Secondary | ICD-10-CM | POA: Diagnosis not present

## 2022-04-16 NOTE — Telephone Encounter (Signed)
Pt came into the office showing red spots on his arms, thinking it maybe coming from his medications.   Also states he's having itching on his body

## 2022-04-18 NOTE — Telephone Encounter (Signed)
Reports red spots on arms and itching for 1 week. No drainage, fever or chest pain. C/o swelling right lower leg and said he was afraid that he had a clot. Says he's had the swelling in his right leg since having surgery on his right hip. Denies pain or redness in right lower leg.. Reports SOB sometimes.  Denies weight gain. Confirmed that he takes eliquis 5 mg twice daily. Advised that since he is taking eliquis, its less likely that he has a clot but also advised that it wasn't impossible to occur either. Offered an appointment to see [place] Calix on Friday, 04/20/2022 and patient declined. Says that he did not need an appointment now. Says he came to the office a week ago about this problem and wanted an appointment at that time. Advised that he came to the office on Monday, 04/16/2022 with this concern. Offered appointment again and declined by patient. Reminded of appointment with Domenic Polite on 05/22/2022 for 6 month f/u, and advised to call office if he changed his mind for a sooner appointment. Verbalized understanding of plan.

## 2022-04-23 NOTE — Telephone Encounter (Signed)
Pt came back into the [place] office this morning concerned about the red spots on his arms and chest.  Offered pt an apt tomorrow with Annita Brod, NP and pt did not want to see her.   Offered pt an apt with Dr. Domenic Polite on Wed 2/21 in the [place] office and pt did not want to go to [place].   Informed pt that we did not have any other openings with Dr. Domenic Polite until his apt on 3/19

## 2022-04-27 DIAGNOSIS — M25551 Pain in right hip: Secondary | ICD-10-CM | POA: Diagnosis not present

## 2022-04-27 DIAGNOSIS — R54 Age-related physical debility: Secondary | ICD-10-CM | POA: Diagnosis not present

## 2022-04-27 DIAGNOSIS — M109 Gout, unspecified: Secondary | ICD-10-CM | POA: Diagnosis not present

## 2022-04-27 DIAGNOSIS — R509 Fever, unspecified: Secondary | ICD-10-CM | POA: Diagnosis not present

## 2022-04-27 DIAGNOSIS — N179 Acute kidney failure, unspecified: Secondary | ICD-10-CM | POA: Diagnosis not present

## 2022-04-27 DIAGNOSIS — J439 Emphysema, unspecified: Secondary | ICD-10-CM | POA: Diagnosis not present

## 2022-04-27 DIAGNOSIS — M549 Dorsalgia, unspecified: Secondary | ICD-10-CM | POA: Diagnosis not present

## 2022-04-27 DIAGNOSIS — B390 Acute pulmonary histoplasmosis capsulati: Secondary | ICD-10-CM | POA: Diagnosis not present

## 2022-04-27 DIAGNOSIS — R079 Chest pain, unspecified: Secondary | ICD-10-CM | POA: Diagnosis not present

## 2022-04-27 DIAGNOSIS — Z7901 Long term (current) use of anticoagulants: Secondary | ICD-10-CM | POA: Diagnosis not present

## 2022-04-27 DIAGNOSIS — N289 Disorder of kidney and ureter, unspecified: Secondary | ICD-10-CM | POA: Diagnosis not present

## 2022-04-27 DIAGNOSIS — R0682 Tachypnea, not elsewhere classified: Secondary | ICD-10-CM | POA: Diagnosis not present

## 2022-04-27 DIAGNOSIS — I252 Old myocardial infarction: Secondary | ICD-10-CM | POA: Diagnosis not present

## 2022-04-27 DIAGNOSIS — R269 Unspecified abnormalities of gait and mobility: Secondary | ICD-10-CM | POA: Diagnosis not present

## 2022-04-27 DIAGNOSIS — J449 Chronic obstructive pulmonary disease, unspecified: Secondary | ICD-10-CM | POA: Diagnosis not present

## 2022-04-27 DIAGNOSIS — I4891 Unspecified atrial fibrillation: Secondary | ICD-10-CM | POA: Diagnosis not present

## 2022-04-27 DIAGNOSIS — N390 Urinary tract infection, site not specified: Secondary | ICD-10-CM | POA: Diagnosis not present

## 2022-04-27 DIAGNOSIS — R109 Unspecified abdominal pain: Secondary | ICD-10-CM | POA: Diagnosis not present

## 2022-04-27 DIAGNOSIS — I10 Essential (primary) hypertension: Secondary | ICD-10-CM | POA: Diagnosis not present

## 2022-04-27 DIAGNOSIS — D649 Anemia, unspecified: Secondary | ICD-10-CM | POA: Diagnosis not present

## 2022-04-27 DIAGNOSIS — R911 Solitary pulmonary nodule: Secondary | ICD-10-CM | POA: Diagnosis not present

## 2022-04-27 DIAGNOSIS — Z79899 Other long term (current) drug therapy: Secondary | ICD-10-CM | POA: Diagnosis not present

## 2022-04-27 DIAGNOSIS — Z792 Long term (current) use of antibiotics: Secondary | ICD-10-CM | POA: Diagnosis not present

## 2022-04-27 DIAGNOSIS — R1031 Right lower quadrant pain: Secondary | ICD-10-CM | POA: Diagnosis not present

## 2022-04-27 DIAGNOSIS — R531 Weakness: Secondary | ICD-10-CM | POA: Diagnosis not present

## 2022-04-27 DIAGNOSIS — Z96641 Presence of right artificial hip joint: Secondary | ICD-10-CM | POA: Diagnosis not present

## 2022-04-27 DIAGNOSIS — R000 Tachycardia, unspecified: Secondary | ICD-10-CM | POA: Diagnosis not present

## 2022-04-27 DIAGNOSIS — C4491 Basal cell carcinoma of skin, unspecified: Secondary | ICD-10-CM | POA: Diagnosis not present

## 2022-04-27 DIAGNOSIS — I2510 Atherosclerotic heart disease of native coronary artery without angina pectoris: Secondary | ICD-10-CM | POA: Diagnosis not present

## 2022-04-27 DIAGNOSIS — Z471 Aftercare following joint replacement surgery: Secondary | ICD-10-CM | POA: Diagnosis not present

## 2022-04-28 DIAGNOSIS — M25551 Pain in right hip: Secondary | ICD-10-CM | POA: Diagnosis not present

## 2022-04-28 DIAGNOSIS — Z96641 Presence of right artificial hip joint: Secondary | ICD-10-CM | POA: Diagnosis not present

## 2022-04-28 DIAGNOSIS — Z471 Aftercare following joint replacement surgery: Secondary | ICD-10-CM | POA: Diagnosis not present

## 2022-04-29 DIAGNOSIS — I2510 Atherosclerotic heart disease of native coronary artery without angina pectoris: Secondary | ICD-10-CM | POA: Diagnosis not present

## 2022-04-29 DIAGNOSIS — N289 Disorder of kidney and ureter, unspecified: Secondary | ICD-10-CM | POA: Diagnosis not present

## 2022-04-29 DIAGNOSIS — I252 Old myocardial infarction: Secondary | ICD-10-CM | POA: Diagnosis not present

## 2022-04-29 DIAGNOSIS — N390 Urinary tract infection, site not specified: Secondary | ICD-10-CM | POA: Diagnosis not present

## 2022-04-29 DIAGNOSIS — Z7901 Long term (current) use of anticoagulants: Secondary | ICD-10-CM | POA: Diagnosis not present

## 2022-04-29 DIAGNOSIS — Z79899 Other long term (current) drug therapy: Secondary | ICD-10-CM | POA: Diagnosis not present

## 2022-04-29 DIAGNOSIS — I10 Essential (primary) hypertension: Secondary | ICD-10-CM | POA: Diagnosis not present

## 2022-04-30 DIAGNOSIS — N390 Urinary tract infection, site not specified: Secondary | ICD-10-CM | POA: Diagnosis not present

## 2022-04-30 DIAGNOSIS — I10 Essential (primary) hypertension: Secondary | ICD-10-CM | POA: Diagnosis not present

## 2022-04-30 DIAGNOSIS — R54 Age-related physical debility: Secondary | ICD-10-CM | POA: Diagnosis not present

## 2022-04-30 DIAGNOSIS — Z7901 Long term (current) use of anticoagulants: Secondary | ICD-10-CM | POA: Diagnosis not present

## 2022-04-30 DIAGNOSIS — Z79899 Other long term (current) drug therapy: Secondary | ICD-10-CM | POA: Diagnosis not present

## 2022-04-30 DIAGNOSIS — I2510 Atherosclerotic heart disease of native coronary artery without angina pectoris: Secondary | ICD-10-CM | POA: Diagnosis not present

## 2022-04-30 DIAGNOSIS — I252 Old myocardial infarction: Secondary | ICD-10-CM | POA: Diagnosis not present

## 2022-04-30 DIAGNOSIS — Z792 Long term (current) use of antibiotics: Secondary | ICD-10-CM | POA: Diagnosis not present

## 2022-04-30 DIAGNOSIS — N289 Disorder of kidney and ureter, unspecified: Secondary | ICD-10-CM | POA: Diagnosis not present

## 2022-05-01 DIAGNOSIS — I252 Old myocardial infarction: Secondary | ICD-10-CM | POA: Diagnosis not present

## 2022-05-01 DIAGNOSIS — I10 Essential (primary) hypertension: Secondary | ICD-10-CM | POA: Diagnosis not present

## 2022-05-01 DIAGNOSIS — I2510 Atherosclerotic heart disease of native coronary artery without angina pectoris: Secondary | ICD-10-CM | POA: Diagnosis not present

## 2022-05-01 DIAGNOSIS — N289 Disorder of kidney and ureter, unspecified: Secondary | ICD-10-CM | POA: Diagnosis not present

## 2022-05-01 DIAGNOSIS — Z7901 Long term (current) use of anticoagulants: Secondary | ICD-10-CM | POA: Diagnosis not present

## 2022-05-01 DIAGNOSIS — R54 Age-related physical debility: Secondary | ICD-10-CM | POA: Diagnosis not present

## 2022-05-01 DIAGNOSIS — Z792 Long term (current) use of antibiotics: Secondary | ICD-10-CM | POA: Diagnosis not present

## 2022-05-01 DIAGNOSIS — N390 Urinary tract infection, site not specified: Secondary | ICD-10-CM | POA: Diagnosis not present

## 2022-05-01 DIAGNOSIS — Z79899 Other long term (current) drug therapy: Secondary | ICD-10-CM | POA: Diagnosis not present

## 2022-05-02 DIAGNOSIS — I252 Old myocardial infarction: Secondary | ICD-10-CM | POA: Diagnosis not present

## 2022-05-02 DIAGNOSIS — R54 Age-related physical debility: Secondary | ICD-10-CM | POA: Diagnosis not present

## 2022-05-02 DIAGNOSIS — N289 Disorder of kidney and ureter, unspecified: Secondary | ICD-10-CM | POA: Diagnosis not present

## 2022-05-02 DIAGNOSIS — Z79899 Other long term (current) drug therapy: Secondary | ICD-10-CM | POA: Diagnosis not present

## 2022-05-02 DIAGNOSIS — I10 Essential (primary) hypertension: Secondary | ICD-10-CM | POA: Diagnosis not present

## 2022-05-02 DIAGNOSIS — N390 Urinary tract infection, site not specified: Secondary | ICD-10-CM | POA: Diagnosis not present

## 2022-05-02 DIAGNOSIS — I2510 Atherosclerotic heart disease of native coronary artery without angina pectoris: Secondary | ICD-10-CM | POA: Diagnosis not present

## 2022-05-02 DIAGNOSIS — Z7901 Long term (current) use of anticoagulants: Secondary | ICD-10-CM | POA: Diagnosis not present

## 2022-05-03 DIAGNOSIS — C4491 Basal cell carcinoma of skin, unspecified: Secondary | ICD-10-CM | POA: Diagnosis not present

## 2022-05-03 DIAGNOSIS — N390 Urinary tract infection, site not specified: Secondary | ICD-10-CM | POA: Diagnosis not present

## 2022-05-03 DIAGNOSIS — I252 Old myocardial infarction: Secondary | ICD-10-CM | POA: Diagnosis not present

## 2022-05-03 DIAGNOSIS — I10 Essential (primary) hypertension: Secondary | ICD-10-CM | POA: Diagnosis not present

## 2022-05-03 DIAGNOSIS — R911 Solitary pulmonary nodule: Secondary | ICD-10-CM | POA: Diagnosis not present

## 2022-05-03 DIAGNOSIS — I2510 Atherosclerotic heart disease of native coronary artery without angina pectoris: Secondary | ICD-10-CM | POA: Diagnosis not present

## 2022-05-03 DIAGNOSIS — R531 Weakness: Secondary | ICD-10-CM | POA: Diagnosis not present

## 2022-05-03 DIAGNOSIS — R269 Unspecified abnormalities of gait and mobility: Secondary | ICD-10-CM | POA: Diagnosis not present

## 2022-05-03 DIAGNOSIS — D649 Anemia, unspecified: Secondary | ICD-10-CM | POA: Diagnosis not present

## 2022-05-04 DIAGNOSIS — I4891 Unspecified atrial fibrillation: Secondary | ICD-10-CM | POA: Diagnosis not present

## 2022-05-04 DIAGNOSIS — I252 Old myocardial infarction: Secondary | ICD-10-CM | POA: Diagnosis not present

## 2022-05-04 DIAGNOSIS — N289 Disorder of kidney and ureter, unspecified: Secondary | ICD-10-CM | POA: Diagnosis not present

## 2022-05-04 DIAGNOSIS — I2510 Atherosclerotic heart disease of native coronary artery without angina pectoris: Secondary | ICD-10-CM | POA: Diagnosis not present

## 2022-05-04 DIAGNOSIS — B390 Acute pulmonary histoplasmosis capsulati: Secondary | ICD-10-CM | POA: Diagnosis not present

## 2022-05-04 DIAGNOSIS — R109 Unspecified abdominal pain: Secondary | ICD-10-CM | POA: Diagnosis not present

## 2022-05-04 DIAGNOSIS — R531 Weakness: Secondary | ICD-10-CM | POA: Diagnosis not present

## 2022-05-04 DIAGNOSIS — D649 Anemia, unspecified: Secondary | ICD-10-CM | POA: Diagnosis not present

## 2022-05-04 DIAGNOSIS — I10 Essential (primary) hypertension: Secondary | ICD-10-CM | POA: Diagnosis not present

## 2022-05-05 DIAGNOSIS — M7989 Other specified soft tissue disorders: Secondary | ICD-10-CM | POA: Diagnosis not present

## 2022-05-05 DIAGNOSIS — Z7902 Long term (current) use of antithrombotics/antiplatelets: Secondary | ICD-10-CM | POA: Diagnosis not present

## 2022-05-05 DIAGNOSIS — I10 Essential (primary) hypertension: Secondary | ICD-10-CM | POA: Diagnosis not present

## 2022-05-05 DIAGNOSIS — R2232 Localized swelling, mass and lump, left upper limb: Secondary | ICD-10-CM | POA: Diagnosis not present

## 2022-05-05 DIAGNOSIS — I2510 Atherosclerotic heart disease of native coronary artery without angina pectoris: Secondary | ICD-10-CM | POA: Diagnosis not present

## 2022-05-05 DIAGNOSIS — I252 Old myocardial infarction: Secondary | ICD-10-CM | POA: Diagnosis not present

## 2022-05-05 DIAGNOSIS — J449 Chronic obstructive pulmonary disease, unspecified: Secondary | ICD-10-CM | POA: Diagnosis not present

## 2022-05-05 DIAGNOSIS — Z79899 Other long term (current) drug therapy: Secondary | ICD-10-CM | POA: Diagnosis not present

## 2022-05-10 DIAGNOSIS — L0100 Impetigo, unspecified: Secondary | ICD-10-CM | POA: Diagnosis not present

## 2022-05-10 DIAGNOSIS — L308 Other specified dermatitis: Secondary | ICD-10-CM | POA: Diagnosis not present

## 2022-05-11 DIAGNOSIS — M545 Low back pain, unspecified: Secondary | ICD-10-CM | POA: Diagnosis not present

## 2022-05-11 DIAGNOSIS — I10 Essential (primary) hypertension: Secondary | ICD-10-CM | POA: Diagnosis not present

## 2022-05-11 DIAGNOSIS — I4891 Unspecified atrial fibrillation: Secondary | ICD-10-CM | POA: Diagnosis not present

## 2022-05-11 DIAGNOSIS — M1A9XX0 Chronic gout, unspecified, without tophus (tophi): Secondary | ICD-10-CM | POA: Diagnosis not present

## 2022-05-11 DIAGNOSIS — I2510 Atherosclerotic heart disease of native coronary artery without angina pectoris: Secondary | ICD-10-CM | POA: Diagnosis not present

## 2022-05-11 DIAGNOSIS — D649 Anemia, unspecified: Secondary | ICD-10-CM | POA: Diagnosis not present

## 2022-05-11 DIAGNOSIS — N390 Urinary tract infection, site not specified: Secondary | ICD-10-CM | POA: Diagnosis not present

## 2022-05-11 DIAGNOSIS — J4489 Other specified chronic obstructive pulmonary disease: Secondary | ICD-10-CM | POA: Diagnosis not present

## 2022-05-11 DIAGNOSIS — M542 Cervicalgia: Secondary | ICD-10-CM | POA: Diagnosis not present

## 2022-05-18 DIAGNOSIS — M545 Low back pain, unspecified: Secondary | ICD-10-CM | POA: Diagnosis not present

## 2022-05-18 DIAGNOSIS — M542 Cervicalgia: Secondary | ICD-10-CM | POA: Diagnosis not present

## 2022-05-18 DIAGNOSIS — D649 Anemia, unspecified: Secondary | ICD-10-CM | POA: Diagnosis not present

## 2022-05-18 DIAGNOSIS — I10 Essential (primary) hypertension: Secondary | ICD-10-CM | POA: Diagnosis not present

## 2022-05-18 DIAGNOSIS — J4489 Other specified chronic obstructive pulmonary disease: Secondary | ICD-10-CM | POA: Diagnosis not present

## 2022-05-18 DIAGNOSIS — I2510 Atherosclerotic heart disease of native coronary artery without angina pectoris: Secondary | ICD-10-CM | POA: Diagnosis not present

## 2022-05-18 DIAGNOSIS — M1A9XX0 Chronic gout, unspecified, without tophus (tophi): Secondary | ICD-10-CM | POA: Diagnosis not present

## 2022-05-18 DIAGNOSIS — N390 Urinary tract infection, site not specified: Secondary | ICD-10-CM | POA: Diagnosis not present

## 2022-05-18 DIAGNOSIS — I4891 Unspecified atrial fibrillation: Secondary | ICD-10-CM | POA: Diagnosis not present

## 2022-05-22 ENCOUNTER — Ambulatory Visit: Payer: Medicare HMO | Attending: Cardiology | Admitting: Cardiology

## 2022-05-22 ENCOUNTER — Encounter: Payer: Self-pay | Admitting: Cardiology

## 2022-05-22 ENCOUNTER — Encounter: Payer: Self-pay | Admitting: *Deleted

## 2022-05-22 VITALS — BP 138/80 | HR 90 | Ht 69.0 in | Wt 143.0 lb

## 2022-05-22 DIAGNOSIS — J449 Chronic obstructive pulmonary disease, unspecified: Secondary | ICD-10-CM | POA: Diagnosis not present

## 2022-05-22 DIAGNOSIS — I10 Essential (primary) hypertension: Secondary | ICD-10-CM

## 2022-05-22 DIAGNOSIS — I350 Nonrheumatic aortic (valve) stenosis: Secondary | ICD-10-CM

## 2022-05-22 DIAGNOSIS — I4821 Permanent atrial fibrillation: Secondary | ICD-10-CM

## 2022-05-22 DIAGNOSIS — M25551 Pain in right hip: Secondary | ICD-10-CM | POA: Diagnosis not present

## 2022-05-22 DIAGNOSIS — Z299 Encounter for prophylactic measures, unspecified: Secondary | ICD-10-CM | POA: Diagnosis not present

## 2022-05-22 NOTE — Patient Instructions (Addendum)
Medication Instructions:  Your physician recommends that you continue on your current medications as directed. Please refer to the Current Medication list given to you today.  Labwork: none  Testing/Procedures: Your physician has requested that you have an echocardiogram in 6 months just before your next visit. Echocardiography is a painless test that uses sound waves to create images of your heart. It provides your doctor with information about the size and shape of your heart and how well your heart's chambers and valves are working. This procedure takes approximately one hour. There are no restrictions for this procedure. Please do NOT wear cologne, perfume, aftershave, or lotions (deodorant is allowed). Please arrive 15 minutes prior to your appointment time.  Follow-Up: Your physician recommends that you schedule a follow-up appointment in: 6 months  Any Other Special Instructions Will Be Listed Below (If Applicable).  If you need a refill on your cardiac medications before your next appointment, please call your pharmacy. 

## 2022-05-22 NOTE — Progress Notes (Signed)
Cardiology Office Note  Date: 05/22/2022   ID: LAKSHYA MARTINIE, DOB 1941-02-27, MRN [id number]  History of Present Illness: Jackson Martin is an 82 y.o. male last seen in September 2023.  He is here for a follow-up visit.  I reviewed substantial interval records.  He was recently hospitalized at [facility] in late February to early March with rapid atrial fibrillation in the setting of right flank pain and abnormal urinalysis.  He was treated for UTI with Rocephin and required IV diltiazem for heart rate control.  He was otherwise continued on Eliquis for stroke prophylaxis.  He was discharged on Cardizem CD 240 mg daily for heart rate control.  He was subsequently seen in the ER for hand swelling which was felt to be related to an IV extravasation.  He is here today for a follow-up visit.  States that he is back home, has home PT scheduled for evaluation.  He saw his PCP earlier today.  He does not report any sense of palpitations or shortness of breath.  I reviewed his medications.  No reported spontaneous bleeding problems on Eliquis.  We did discuss his echocardiogram results from July of last year.  He has mild to moderate calcific aortic stenosis which we will continue to follow over time.  Physical Exam: VS:  BP 138/80   Pulse 90   Ht 5\' 9"  (1.753 m)   Wt 143 lb (64.9 kg)   SpO2 96%   BMI 21.12 kg/m , BMI Body mass index is 21.12 kg/m.  Wt Readings from Last 3 Encounters:  05/22/22 143 lb (64.9 kg)  11/10/21 145 lb 12.8 oz (66.1 kg)  10/20/21 147 lb (66.7 kg)    General: Patient appears comfortable at rest. HEENT: Conjunctiva and lids normal. Neck: Supple, no elevated JVP or carotid bruits. Lungs: Clear to auscultation, nonlabored breathing at rest. Cardiac: Irregularly irregular, 2/6 systolic murmur, no gallop. Extremities: No pitting edema.  ECG:  An ECG dated 09/30/2021 was personally reviewed today and demonstrated:  Atrial fibrillation with RVR,  IVCD.  Labwork: 10/06/2021: ALT 12; AST 16 10/08/2021: BUN 21; Creatinine, Ser 1.14; Potassium 4.2; Sodium 137 10/09/2021: Hemoglobin 12.8; Platelets 240  February 2024: BUN 14, creatinine 1.09, potassium 3.8, AST 8, ALT 10, hemoglobin 11.6, platelets 197  Other Studies Reviewed Today:  Echocardiogram 10/02/2021:  1. Left ventricular ejection fraction, by estimation, is 60 to 65%. The  left ventricle has normal function. The left ventricle has no regional  wall motion abnormalities. Left ventricular diastolic parameters are  indeterminate.   2. Right ventricular systolic function is normal. The right ventricular  size is mildly enlarged. There is normal pulmonary artery systolic  pressure.   3. Left atrial size was moderately dilated.   4. Right atrial size was mildly dilated.   5. The mitral valve is normal in structure. Mild mitral valve  regurgitation.   6. AV is thickened, calcified with restrticted motion. Peak and mean  gradients through the valve are 27 and 12 mm Hg respectviely AVA (VTI) is  1.43 cm2 . Dimensionless index is 0.39 Overall, all consistent with mild  to moderate AS. Marland Kitchen The aortic valve is   tricuspid. Aortic valve regurgitation is not visualized.   7. The inferior vena cava is dilated in size with >50% respiratory  variability, suggesting right atrial pressure of 8 mmHg.    Lexiscan Myoview 10/03/2021: IMPRESSION: 1. Fixed defect in the inferior wall consistent with scar versus diaphragmatic attenuation. No wall motion  abnormality favors attenuation.   2. Normal left ventricular wall motion.   3. Left ventricular ejection fraction 69%   4. Non invasive risk stratification*: Low  Assessment and Plan:  1.  Permanent atrial fibrillation with CHA2DS2-VASc score of 4.  Heart rate is better controlled today.  Plan is to continue Cardizem CD 240 mg daily along with Eliquis for stroke prophylaxis.  I did review his lab work from February.  2.  Mild to moderate  degenerative calcific aortic stenosis with mean AV gradient 12 mmHg and dimensionless index 0.39 by echocardiogram in July 2023.  We will plan a follow-up echocardiogram with clinical reassessment in 6 months.  3.  Essential hypertension.  Keep follow-up with PCP.  Disposition:  Follow up  6 months.  Signed, Satira Sark, M.D., F.A.C.C.

## 2022-05-24 ENCOUNTER — Telehealth: Payer: Self-pay | Admitting: Cardiology

## 2022-05-24 ENCOUNTER — Other Ambulatory Visit: Payer: Self-pay | Admitting: *Deleted

## 2022-05-24 ENCOUNTER — Encounter: Payer: Self-pay | Admitting: *Deleted

## 2022-05-24 DIAGNOSIS — I10 Essential (primary) hypertension: Secondary | ICD-10-CM | POA: Diagnosis not present

## 2022-05-24 DIAGNOSIS — M1A9XX0 Chronic gout, unspecified, without tophus (tophi): Secondary | ICD-10-CM | POA: Diagnosis not present

## 2022-05-24 DIAGNOSIS — N390 Urinary tract infection, site not specified: Secondary | ICD-10-CM | POA: Diagnosis not present

## 2022-05-24 DIAGNOSIS — I2510 Atherosclerotic heart disease of native coronary artery without angina pectoris: Secondary | ICD-10-CM | POA: Diagnosis not present

## 2022-05-24 DIAGNOSIS — M542 Cervicalgia: Secondary | ICD-10-CM | POA: Diagnosis not present

## 2022-05-24 DIAGNOSIS — D649 Anemia, unspecified: Secondary | ICD-10-CM | POA: Diagnosis not present

## 2022-05-24 DIAGNOSIS — I4891 Unspecified atrial fibrillation: Secondary | ICD-10-CM | POA: Diagnosis not present

## 2022-05-24 DIAGNOSIS — J4489 Other specified chronic obstructive pulmonary disease: Secondary | ICD-10-CM | POA: Diagnosis not present

## 2022-05-24 DIAGNOSIS — M545 Low back pain, unspecified: Secondary | ICD-10-CM | POA: Diagnosis not present

## 2022-05-24 MED ORDER — APIXABAN 5 MG PO TABS: 5.0000 mg | ORAL_TABLET | Freq: Two times a day (BID) | ORAL | 3 refills | Status: DC

## 2022-05-24 MED ORDER — APIXABAN 5 MG PO TABS: 5.0000 mg | ORAL_TABLET | Freq: Two times a day (BID) | ORAL | 0 refills | Status: DC

## 2022-05-24 NOTE — Telephone Encounter (Signed)
Advised that eliquis samples are available for pick up along with BMS-PAF that she is to fill out all patient sections and bring back to office with 2024 proof of income and prescription out of pocket expense. Verbalized understanding.

## 2022-05-24 NOTE — Telephone Encounter (Signed)
This encounter was created in error - please disregard.

## 2022-05-24 NOTE — Telephone Encounter (Signed)
Patient calling the office for samples of medication:   1.  What medication and dosage are you requesting samples for? apixaban (ELIQUIS) 5 MG TABS tablet  2.  Are you currently out of this medication? No    

## 2022-05-24 NOTE — Telephone Encounter (Signed)
Patient informed and verbalized understanding of plan. 

## 2022-05-24 NOTE — Telephone Encounter (Signed)
[facility] contacted for eliquis 5 mg co-pay amount and is $45/month

## 2022-05-28 DIAGNOSIS — M542 Cervicalgia: Secondary | ICD-10-CM | POA: Diagnosis not present

## 2022-05-28 DIAGNOSIS — I4891 Unspecified atrial fibrillation: Secondary | ICD-10-CM | POA: Diagnosis not present

## 2022-05-28 DIAGNOSIS — M1A9XX0 Chronic gout, unspecified, without tophus (tophi): Secondary | ICD-10-CM | POA: Diagnosis not present

## 2022-05-28 DIAGNOSIS — M545 Low back pain, unspecified: Secondary | ICD-10-CM | POA: Diagnosis not present

## 2022-05-28 DIAGNOSIS — I2510 Atherosclerotic heart disease of native coronary artery without angina pectoris: Secondary | ICD-10-CM | POA: Diagnosis not present

## 2022-05-28 DIAGNOSIS — J4489 Other specified chronic obstructive pulmonary disease: Secondary | ICD-10-CM | POA: Diagnosis not present

## 2022-05-28 DIAGNOSIS — D649 Anemia, unspecified: Secondary | ICD-10-CM | POA: Diagnosis not present

## 2022-05-28 DIAGNOSIS — I10 Essential (primary) hypertension: Secondary | ICD-10-CM | POA: Diagnosis not present

## 2022-05-28 DIAGNOSIS — N390 Urinary tract infection, site not specified: Secondary | ICD-10-CM | POA: Diagnosis not present

## 2022-05-29 DIAGNOSIS — I10 Essential (primary) hypertension: Secondary | ICD-10-CM | POA: Diagnosis not present

## 2022-05-29 DIAGNOSIS — I4891 Unspecified atrial fibrillation: Secondary | ICD-10-CM | POA: Diagnosis not present

## 2022-05-29 DIAGNOSIS — I2510 Atherosclerotic heart disease of native coronary artery without angina pectoris: Secondary | ICD-10-CM | POA: Diagnosis not present

## 2022-05-29 DIAGNOSIS — N390 Urinary tract infection, site not specified: Secondary | ICD-10-CM | POA: Diagnosis not present

## 2022-05-30 DIAGNOSIS — M1A9XX0 Chronic gout, unspecified, without tophus (tophi): Secondary | ICD-10-CM | POA: Diagnosis not present

## 2022-05-30 DIAGNOSIS — M545 Low back pain, unspecified: Secondary | ICD-10-CM | POA: Diagnosis not present

## 2022-05-30 DIAGNOSIS — N390 Urinary tract infection, site not specified: Secondary | ICD-10-CM | POA: Diagnosis not present

## 2022-05-30 DIAGNOSIS — I2510 Atherosclerotic heart disease of native coronary artery without angina pectoris: Secondary | ICD-10-CM | POA: Diagnosis not present

## 2022-05-30 DIAGNOSIS — D649 Anemia, unspecified: Secondary | ICD-10-CM | POA: Diagnosis not present

## 2022-05-30 DIAGNOSIS — I10 Essential (primary) hypertension: Secondary | ICD-10-CM | POA: Diagnosis not present

## 2022-05-30 DIAGNOSIS — I4891 Unspecified atrial fibrillation: Secondary | ICD-10-CM | POA: Diagnosis not present

## 2022-05-30 DIAGNOSIS — M542 Cervicalgia: Secondary | ICD-10-CM | POA: Diagnosis not present

## 2022-05-30 DIAGNOSIS — J4489 Other specified chronic obstructive pulmonary disease: Secondary | ICD-10-CM | POA: Diagnosis not present

## 2022-06-05 DIAGNOSIS — I4891 Unspecified atrial fibrillation: Secondary | ICD-10-CM | POA: Diagnosis not present

## 2022-06-05 DIAGNOSIS — R52 Pain, unspecified: Secondary | ICD-10-CM | POA: Diagnosis not present

## 2022-06-05 DIAGNOSIS — F419 Anxiety disorder, unspecified: Secondary | ICD-10-CM | POA: Diagnosis not present

## 2022-06-05 DIAGNOSIS — Z515 Encounter for palliative care: Secondary | ICD-10-CM | POA: Diagnosis not present

## 2022-06-05 DIAGNOSIS — I10 Essential (primary) hypertension: Secondary | ICD-10-CM | POA: Diagnosis not present

## 2022-06-05 DIAGNOSIS — F32A Depression, unspecified: Secondary | ICD-10-CM | POA: Diagnosis not present

## 2022-06-07 DIAGNOSIS — M545 Low back pain, unspecified: Secondary | ICD-10-CM | POA: Diagnosis not present

## 2022-06-07 DIAGNOSIS — M542 Cervicalgia: Secondary | ICD-10-CM | POA: Diagnosis not present

## 2022-06-07 DIAGNOSIS — M1A9XX0 Chronic gout, unspecified, without tophus (tophi): Secondary | ICD-10-CM | POA: Diagnosis not present

## 2022-06-07 DIAGNOSIS — N390 Urinary tract infection, site not specified: Secondary | ICD-10-CM | POA: Diagnosis not present

## 2022-06-07 DIAGNOSIS — I4891 Unspecified atrial fibrillation: Secondary | ICD-10-CM | POA: Diagnosis not present

## 2022-06-07 DIAGNOSIS — I2510 Atherosclerotic heart disease of native coronary artery without angina pectoris: Secondary | ICD-10-CM | POA: Diagnosis not present

## 2022-06-07 DIAGNOSIS — I10 Essential (primary) hypertension: Secondary | ICD-10-CM | POA: Diagnosis not present

## 2022-06-07 DIAGNOSIS — D649 Anemia, unspecified: Secondary | ICD-10-CM | POA: Diagnosis not present

## 2022-06-07 DIAGNOSIS — J4489 Other specified chronic obstructive pulmonary disease: Secondary | ICD-10-CM | POA: Diagnosis not present

## 2022-06-10 DIAGNOSIS — M1A9XX0 Chronic gout, unspecified, without tophus (tophi): Secondary | ICD-10-CM | POA: Diagnosis not present

## 2022-06-10 DIAGNOSIS — D649 Anemia, unspecified: Secondary | ICD-10-CM | POA: Diagnosis not present

## 2022-06-10 DIAGNOSIS — I2510 Atherosclerotic heart disease of native coronary artery without angina pectoris: Secondary | ICD-10-CM | POA: Diagnosis not present

## 2022-06-10 DIAGNOSIS — N390 Urinary tract infection, site not specified: Secondary | ICD-10-CM | POA: Diagnosis not present

## 2022-06-10 DIAGNOSIS — M545 Low back pain, unspecified: Secondary | ICD-10-CM | POA: Diagnosis not present

## 2022-06-10 DIAGNOSIS — I4891 Unspecified atrial fibrillation: Secondary | ICD-10-CM | POA: Diagnosis not present

## 2022-06-10 DIAGNOSIS — J4489 Other specified chronic obstructive pulmonary disease: Secondary | ICD-10-CM | POA: Diagnosis not present

## 2022-06-10 DIAGNOSIS — M542 Cervicalgia: Secondary | ICD-10-CM | POA: Diagnosis not present

## 2022-06-10 DIAGNOSIS — I10 Essential (primary) hypertension: Secondary | ICD-10-CM | POA: Diagnosis not present

## 2022-06-11 DIAGNOSIS — I2510 Atherosclerotic heart disease of native coronary artery without angina pectoris: Secondary | ICD-10-CM | POA: Diagnosis not present

## 2022-06-11 DIAGNOSIS — M1A9XX0 Chronic gout, unspecified, without tophus (tophi): Secondary | ICD-10-CM | POA: Diagnosis not present

## 2022-06-11 DIAGNOSIS — M545 Low back pain, unspecified: Secondary | ICD-10-CM | POA: Diagnosis not present

## 2022-06-11 DIAGNOSIS — M542 Cervicalgia: Secondary | ICD-10-CM | POA: Diagnosis not present

## 2022-06-11 DIAGNOSIS — I4891 Unspecified atrial fibrillation: Secondary | ICD-10-CM | POA: Diagnosis not present

## 2022-06-11 DIAGNOSIS — D649 Anemia, unspecified: Secondary | ICD-10-CM | POA: Diagnosis not present

## 2022-06-11 DIAGNOSIS — J4489 Other specified chronic obstructive pulmonary disease: Secondary | ICD-10-CM | POA: Diagnosis not present

## 2022-06-11 DIAGNOSIS — N390 Urinary tract infection, site not specified: Secondary | ICD-10-CM | POA: Diagnosis not present

## 2022-06-11 DIAGNOSIS — I10 Essential (primary) hypertension: Secondary | ICD-10-CM | POA: Diagnosis not present

## 2022-06-12 ENCOUNTER — Encounter (INDEPENDENT_AMBULATORY_CARE_PROVIDER_SITE_OTHER): Payer: Self-pay | Admitting: Gastroenterology

## 2022-06-15 DIAGNOSIS — I2510 Atherosclerotic heart disease of native coronary artery without angina pectoris: Secondary | ICD-10-CM | POA: Diagnosis not present

## 2022-06-15 DIAGNOSIS — M542 Cervicalgia: Secondary | ICD-10-CM | POA: Diagnosis not present

## 2022-06-15 DIAGNOSIS — M1A9XX0 Chronic gout, unspecified, without tophus (tophi): Secondary | ICD-10-CM | POA: Diagnosis not present

## 2022-06-15 DIAGNOSIS — J4489 Other specified chronic obstructive pulmonary disease: Secondary | ICD-10-CM | POA: Diagnosis not present

## 2022-06-15 DIAGNOSIS — N390 Urinary tract infection, site not specified: Secondary | ICD-10-CM | POA: Diagnosis not present

## 2022-06-15 DIAGNOSIS — I4891 Unspecified atrial fibrillation: Secondary | ICD-10-CM | POA: Diagnosis not present

## 2022-06-15 DIAGNOSIS — I10 Essential (primary) hypertension: Secondary | ICD-10-CM | POA: Diagnosis not present

## 2022-06-15 DIAGNOSIS — D649 Anemia, unspecified: Secondary | ICD-10-CM | POA: Diagnosis not present

## 2022-06-15 DIAGNOSIS — M545 Low back pain, unspecified: Secondary | ICD-10-CM | POA: Diagnosis not present

## 2022-06-18 DIAGNOSIS — M1A9XX0 Chronic gout, unspecified, without tophus (tophi): Secondary | ICD-10-CM | POA: Diagnosis not present

## 2022-06-18 DIAGNOSIS — I2510 Atherosclerotic heart disease of native coronary artery without angina pectoris: Secondary | ICD-10-CM | POA: Diagnosis not present

## 2022-06-18 DIAGNOSIS — N390 Urinary tract infection, site not specified: Secondary | ICD-10-CM | POA: Diagnosis not present

## 2022-06-18 DIAGNOSIS — M542 Cervicalgia: Secondary | ICD-10-CM | POA: Diagnosis not present

## 2022-06-18 DIAGNOSIS — D649 Anemia, unspecified: Secondary | ICD-10-CM | POA: Diagnosis not present

## 2022-06-18 DIAGNOSIS — M545 Low back pain, unspecified: Secondary | ICD-10-CM | POA: Diagnosis not present

## 2022-06-18 DIAGNOSIS — I10 Essential (primary) hypertension: Secondary | ICD-10-CM | POA: Diagnosis not present

## 2022-06-18 DIAGNOSIS — I4891 Unspecified atrial fibrillation: Secondary | ICD-10-CM | POA: Diagnosis not present

## 2022-06-18 DIAGNOSIS — J4489 Other specified chronic obstructive pulmonary disease: Secondary | ICD-10-CM | POA: Diagnosis not present

## 2022-06-19 ENCOUNTER — Other Ambulatory Visit: Payer: Self-pay | Admitting: Cardiology

## 2022-06-21 DIAGNOSIS — N390 Urinary tract infection, site not specified: Secondary | ICD-10-CM | POA: Diagnosis not present

## 2022-06-21 DIAGNOSIS — D649 Anemia, unspecified: Secondary | ICD-10-CM | POA: Diagnosis not present

## 2022-06-21 DIAGNOSIS — M542 Cervicalgia: Secondary | ICD-10-CM | POA: Diagnosis not present

## 2022-06-21 DIAGNOSIS — M1A9XX0 Chronic gout, unspecified, without tophus (tophi): Secondary | ICD-10-CM | POA: Diagnosis not present

## 2022-06-21 DIAGNOSIS — M545 Low back pain, unspecified: Secondary | ICD-10-CM | POA: Diagnosis not present

## 2022-06-21 DIAGNOSIS — I4891 Unspecified atrial fibrillation: Secondary | ICD-10-CM | POA: Diagnosis not present

## 2022-06-21 DIAGNOSIS — I2510 Atherosclerotic heart disease of native coronary artery without angina pectoris: Secondary | ICD-10-CM | POA: Diagnosis not present

## 2022-06-21 DIAGNOSIS — I10 Essential (primary) hypertension: Secondary | ICD-10-CM | POA: Diagnosis not present

## 2022-06-21 DIAGNOSIS — J4489 Other specified chronic obstructive pulmonary disease: Secondary | ICD-10-CM | POA: Diagnosis not present

## 2022-06-26 DIAGNOSIS — M1A9XX0 Chronic gout, unspecified, without tophus (tophi): Secondary | ICD-10-CM | POA: Diagnosis not present

## 2022-06-26 DIAGNOSIS — N390 Urinary tract infection, site not specified: Secondary | ICD-10-CM | POA: Diagnosis not present

## 2022-06-26 DIAGNOSIS — I4891 Unspecified atrial fibrillation: Secondary | ICD-10-CM | POA: Diagnosis not present

## 2022-06-26 DIAGNOSIS — I2510 Atherosclerotic heart disease of native coronary artery without angina pectoris: Secondary | ICD-10-CM | POA: Diagnosis not present

## 2022-06-26 DIAGNOSIS — M542 Cervicalgia: Secondary | ICD-10-CM | POA: Diagnosis not present

## 2022-06-26 DIAGNOSIS — J4489 Other specified chronic obstructive pulmonary disease: Secondary | ICD-10-CM | POA: Diagnosis not present

## 2022-06-26 DIAGNOSIS — I10 Essential (primary) hypertension: Secondary | ICD-10-CM | POA: Diagnosis not present

## 2022-06-26 DIAGNOSIS — M545 Low back pain, unspecified: Secondary | ICD-10-CM | POA: Diagnosis not present

## 2022-06-26 DIAGNOSIS — D649 Anemia, unspecified: Secondary | ICD-10-CM | POA: Diagnosis not present

## 2022-07-02 DIAGNOSIS — I10 Essential (primary) hypertension: Secondary | ICD-10-CM | POA: Diagnosis not present

## 2022-07-02 DIAGNOSIS — M542 Cervicalgia: Secondary | ICD-10-CM | POA: Diagnosis not present

## 2022-07-02 DIAGNOSIS — I2510 Atherosclerotic heart disease of native coronary artery without angina pectoris: Secondary | ICD-10-CM | POA: Diagnosis not present

## 2022-07-02 DIAGNOSIS — J4489 Other specified chronic obstructive pulmonary disease: Secondary | ICD-10-CM | POA: Diagnosis not present

## 2022-07-02 DIAGNOSIS — M1A9XX0 Chronic gout, unspecified, without tophus (tophi): Secondary | ICD-10-CM | POA: Diagnosis not present

## 2022-07-02 DIAGNOSIS — D649 Anemia, unspecified: Secondary | ICD-10-CM | POA: Diagnosis not present

## 2022-07-02 DIAGNOSIS — I4891 Unspecified atrial fibrillation: Secondary | ICD-10-CM | POA: Diagnosis not present

## 2022-07-02 DIAGNOSIS — N390 Urinary tract infection, site not specified: Secondary | ICD-10-CM | POA: Diagnosis not present

## 2022-07-02 DIAGNOSIS — M545 Low back pain, unspecified: Secondary | ICD-10-CM | POA: Diagnosis not present

## 2022-07-03 DIAGNOSIS — I10 Essential (primary) hypertension: Secondary | ICD-10-CM | POA: Diagnosis not present

## 2022-07-03 DIAGNOSIS — M1A9XX0 Chronic gout, unspecified, without tophus (tophi): Secondary | ICD-10-CM | POA: Diagnosis not present

## 2022-07-03 DIAGNOSIS — M542 Cervicalgia: Secondary | ICD-10-CM | POA: Diagnosis not present

## 2022-07-03 DIAGNOSIS — M545 Low back pain, unspecified: Secondary | ICD-10-CM | POA: Diagnosis not present

## 2022-07-03 DIAGNOSIS — I2510 Atherosclerotic heart disease of native coronary artery without angina pectoris: Secondary | ICD-10-CM | POA: Diagnosis not present

## 2022-07-03 DIAGNOSIS — J4489 Other specified chronic obstructive pulmonary disease: Secondary | ICD-10-CM | POA: Diagnosis not present

## 2022-07-03 DIAGNOSIS — I4891 Unspecified atrial fibrillation: Secondary | ICD-10-CM | POA: Diagnosis not present

## 2022-07-03 DIAGNOSIS — D649 Anemia, unspecified: Secondary | ICD-10-CM | POA: Diagnosis not present

## 2022-07-03 DIAGNOSIS — N390 Urinary tract infection, site not specified: Secondary | ICD-10-CM | POA: Diagnosis not present

## 2022-07-09 DIAGNOSIS — I2510 Atherosclerotic heart disease of native coronary artery without angina pectoris: Secondary | ICD-10-CM | POA: Diagnosis not present

## 2022-07-09 DIAGNOSIS — M1A9XX0 Chronic gout, unspecified, without tophus (tophi): Secondary | ICD-10-CM | POA: Diagnosis not present

## 2022-07-09 DIAGNOSIS — J4489 Other specified chronic obstructive pulmonary disease: Secondary | ICD-10-CM | POA: Diagnosis not present

## 2022-07-09 DIAGNOSIS — M542 Cervicalgia: Secondary | ICD-10-CM | POA: Diagnosis not present

## 2022-07-09 DIAGNOSIS — I10 Essential (primary) hypertension: Secondary | ICD-10-CM | POA: Diagnosis not present

## 2022-07-09 DIAGNOSIS — M545 Low back pain, unspecified: Secondary | ICD-10-CM | POA: Diagnosis not present

## 2022-07-09 DIAGNOSIS — D649 Anemia, unspecified: Secondary | ICD-10-CM | POA: Diagnosis not present

## 2022-07-09 DIAGNOSIS — N390 Urinary tract infection, site not specified: Secondary | ICD-10-CM | POA: Diagnosis not present

## 2022-07-09 DIAGNOSIS — I4891 Unspecified atrial fibrillation: Secondary | ICD-10-CM | POA: Diagnosis not present

## 2022-07-17 DIAGNOSIS — F419 Anxiety disorder, unspecified: Secondary | ICD-10-CM | POA: Diagnosis not present

## 2022-07-17 DIAGNOSIS — F32A Depression, unspecified: Secondary | ICD-10-CM | POA: Diagnosis not present

## 2022-07-19 DIAGNOSIS — Z515 Encounter for palliative care: Secondary | ICD-10-CM | POA: Diagnosis not present

## 2022-07-19 DIAGNOSIS — I4891 Unspecified atrial fibrillation: Secondary | ICD-10-CM | POA: Diagnosis not present

## 2022-07-19 DIAGNOSIS — F32A Depression, unspecified: Secondary | ICD-10-CM | POA: Diagnosis not present

## 2022-07-19 DIAGNOSIS — F419 Anxiety disorder, unspecified: Secondary | ICD-10-CM | POA: Diagnosis not present

## 2022-07-25 DIAGNOSIS — F330 Major depressive disorder, recurrent, mild: Secondary | ICD-10-CM | POA: Diagnosis not present

## 2022-08-01 ENCOUNTER — Telehealth: Payer: Self-pay | Admitting: Cardiology

## 2022-08-01 ENCOUNTER — Telehealth: Payer: Self-pay | Admitting: *Deleted

## 2022-08-01 DIAGNOSIS — L0390 Cellulitis, unspecified: Secondary | ICD-10-CM | POA: Diagnosis not present

## 2022-08-01 DIAGNOSIS — I4891 Unspecified atrial fibrillation: Secondary | ICD-10-CM | POA: Diagnosis not present

## 2022-08-01 DIAGNOSIS — I10 Essential (primary) hypertension: Secondary | ICD-10-CM | POA: Diagnosis not present

## 2022-08-01 DIAGNOSIS — R42 Dizziness and giddiness: Secondary | ICD-10-CM | POA: Diagnosis not present

## 2022-08-01 DIAGNOSIS — Z299 Encounter for prophylactic measures, unspecified: Secondary | ICD-10-CM | POA: Diagnosis not present

## 2022-08-01 MED ORDER — APIXABAN 5 MG PO TABS: 5.0000 mg | ORAL_TABLET | Freq: Two times a day (BID) | ORAL | 0 refills | Status: DC

## 2022-08-01 NOTE — Telephone Encounter (Signed)
Request for Eliquis 5mg  samples: Indication: Last office visit: 05/22/22  Ival Bible MD Scr: 1.09 on 05/02/22  Epic Age: 82 Weight: 64.9kg  Based on above findings Eliquis 5mg  twice daily is the appropriate dose.  OK to give samples if available.

## 2022-08-01 NOTE — Telephone Encounter (Signed)
He walks in with sores on his left hand and, a opening on his left arm. He fell 3 weeks ago. He said his arm bleed bad when he pulled back the skin. He is on eliquis. He is not sure what he needs to do about his arm not healing and,the bleeding he is having when he lays on it at night. He is requesting samples of eliquis he says he is out and he can't afford to buy it.

## 2022-08-01 NOTE — Telephone Encounter (Signed)
Reports he drove himself to office today and is needing eliquis samples. Eliquis 5 mg samples given to patient as he is pending patient assistance.  Advise that his copay for eliquis is $45 and he says he can't afford that. Does have scabbed skin tear to left lateral arm. No bleeding, drainage or swelling. Advised he should wrap arm at night to prevent injury to skin tear. Reports weakness in right hip that causes him to fall. Advised to contact PCP and orthopedic for hip problem. Verbalized understanding.

## 2022-08-01 NOTE — Telephone Encounter (Signed)
Sorry.  Didn't see first message.  Request for Eliquis 5mg  samples: Indication: Last office visit: 05/22/22  Ival Bible MD Scr: 1.09 on 05/02/22  Epic Age: 82 Weight: 64.9kg   Based on above findings Eliquis 5mg  twice daily is the appropriate dose.  OK to give samples if available.

## 2022-08-06 DIAGNOSIS — D239 Other benign neoplasm of skin, unspecified: Secondary | ICD-10-CM | POA: Diagnosis not present

## 2022-08-06 DIAGNOSIS — W19XXXA Unspecified fall, initial encounter: Secondary | ICD-10-CM | POA: Diagnosis not present

## 2022-08-06 DIAGNOSIS — Z299 Encounter for prophylactic measures, unspecified: Secondary | ICD-10-CM | POA: Diagnosis not present

## 2022-08-06 DIAGNOSIS — R2681 Unsteadiness on feet: Secondary | ICD-10-CM | POA: Diagnosis not present

## 2022-08-06 DIAGNOSIS — I10 Essential (primary) hypertension: Secondary | ICD-10-CM | POA: Diagnosis not present

## 2022-08-10 DIAGNOSIS — D239 Other benign neoplasm of skin, unspecified: Secondary | ICD-10-CM | POA: Diagnosis not present

## 2022-08-10 DIAGNOSIS — F331 Major depressive disorder, recurrent, moderate: Secondary | ICD-10-CM | POA: Diagnosis not present

## 2022-08-10 DIAGNOSIS — I4891 Unspecified atrial fibrillation: Secondary | ICD-10-CM | POA: Diagnosis not present

## 2022-08-10 DIAGNOSIS — I10 Essential (primary) hypertension: Secondary | ICD-10-CM | POA: Diagnosis not present

## 2022-08-10 DIAGNOSIS — Z299 Encounter for prophylactic measures, unspecified: Secondary | ICD-10-CM | POA: Diagnosis not present

## 2022-08-15 ENCOUNTER — Telehealth: Payer: Self-pay | Admitting: Cardiology

## 2022-08-15 DIAGNOSIS — N400 Enlarged prostate without lower urinary tract symptoms: Secondary | ICD-10-CM | POA: Diagnosis not present

## 2022-08-15 DIAGNOSIS — L03113 Cellulitis of right upper limb: Secondary | ICD-10-CM | POA: Diagnosis not present

## 2022-08-15 DIAGNOSIS — M109 Gout, unspecified: Secondary | ICD-10-CM | POA: Diagnosis not present

## 2022-08-15 DIAGNOSIS — I2510 Atherosclerotic heart disease of native coronary artery without angina pectoris: Secondary | ICD-10-CM | POA: Diagnosis not present

## 2022-08-15 DIAGNOSIS — J4489 Other specified chronic obstructive pulmonary disease: Secondary | ICD-10-CM | POA: Diagnosis not present

## 2022-08-15 DIAGNOSIS — I4891 Unspecified atrial fibrillation: Secondary | ICD-10-CM | POA: Diagnosis not present

## 2022-08-15 DIAGNOSIS — I10 Essential (primary) hypertension: Secondary | ICD-10-CM | POA: Diagnosis not present

## 2022-08-15 DIAGNOSIS — R2681 Unsteadiness on feet: Secondary | ICD-10-CM | POA: Diagnosis not present

## 2022-08-15 DIAGNOSIS — D239 Other benign neoplasm of skin, unspecified: Secondary | ICD-10-CM | POA: Diagnosis not present

## 2022-08-15 DIAGNOSIS — S41112D Laceration without foreign body of left upper arm, subsequent encounter: Secondary | ICD-10-CM | POA: Diagnosis not present

## 2022-08-15 NOTE — Telephone Encounter (Signed)
Reports his therapist told him he shouldn't be driving due to gait instability and dizziness. Reports when he stands up he feels like he is going to fall because he is dizzy. Reports standing up slowly and waits a few seconds before walking. Patient confirmed that he did drive himself to the office today. Reports a nurse comes to his house twice per week and checks his BP and says its good. Patient said he thinks since he has been on eliquis for a while that it may be causing his symptoms. Stated "maybe my blood is too thin being on eliquis". Advised that eliquis didn't require lab monitoring to check whether his blood was too thin or thick but did need labs to check kidney functioning and blood counts per guidelines. Offered a nurse visit to have orthostatic vitals done and patient declined. Offered an office visit with [facility] next week, and patient declined. Says he has an appointment with his PCP on Friday and will address at that time and request labs be done. Advised to contact office back if he changed his mind. Verbalized understanding.

## 2022-08-15 NOTE — Telephone Encounter (Signed)
Pt came into the office requesting to have his labs drawn to see if his blood is to thin from taking Eliquis.  Pt states he's been dizzy and he hit his arm and it pilled the skin back.   Please call [phone]

## 2022-08-17 DIAGNOSIS — I10 Essential (primary) hypertension: Secondary | ICD-10-CM | POA: Diagnosis not present

## 2022-08-17 DIAGNOSIS — I25118 Atherosclerotic heart disease of native coronary artery with other forms of angina pectoris: Secondary | ICD-10-CM | POA: Diagnosis not present

## 2022-08-17 DIAGNOSIS — I700 Atherosclerosis of aorta: Secondary | ICD-10-CM | POA: Diagnosis not present

## 2022-08-17 DIAGNOSIS — Z0000 Encounter for general adult medical examination without abnormal findings: Secondary | ICD-10-CM | POA: Diagnosis not present

## 2022-08-17 DIAGNOSIS — F331 Major depressive disorder, recurrent, moderate: Secondary | ICD-10-CM | POA: Diagnosis not present

## 2022-08-17 DIAGNOSIS — Z299 Encounter for prophylactic measures, unspecified: Secondary | ICD-10-CM | POA: Diagnosis not present

## 2022-08-20 DIAGNOSIS — I4891 Unspecified atrial fibrillation: Secondary | ICD-10-CM | POA: Diagnosis not present

## 2022-08-20 DIAGNOSIS — I10 Essential (primary) hypertension: Secondary | ICD-10-CM | POA: Diagnosis not present

## 2022-08-20 DIAGNOSIS — I2510 Atherosclerotic heart disease of native coronary artery without angina pectoris: Secondary | ICD-10-CM | POA: Diagnosis not present

## 2022-08-20 DIAGNOSIS — N400 Enlarged prostate without lower urinary tract symptoms: Secondary | ICD-10-CM | POA: Diagnosis not present

## 2022-08-20 DIAGNOSIS — D239 Other benign neoplasm of skin, unspecified: Secondary | ICD-10-CM | POA: Diagnosis not present

## 2022-08-20 DIAGNOSIS — J4489 Other specified chronic obstructive pulmonary disease: Secondary | ICD-10-CM | POA: Diagnosis not present

## 2022-08-20 DIAGNOSIS — L03113 Cellulitis of right upper limb: Secondary | ICD-10-CM | POA: Diagnosis not present

## 2022-08-20 DIAGNOSIS — S41112D Laceration without foreign body of left upper arm, subsequent encounter: Secondary | ICD-10-CM | POA: Diagnosis not present

## 2022-08-20 DIAGNOSIS — M109 Gout, unspecified: Secondary | ICD-10-CM | POA: Diagnosis not present

## 2022-08-21 DIAGNOSIS — D239 Other benign neoplasm of skin, unspecified: Secondary | ICD-10-CM | POA: Diagnosis not present

## 2022-08-21 DIAGNOSIS — J4489 Other specified chronic obstructive pulmonary disease: Secondary | ICD-10-CM | POA: Diagnosis not present

## 2022-08-21 DIAGNOSIS — N400 Enlarged prostate without lower urinary tract symptoms: Secondary | ICD-10-CM | POA: Diagnosis not present

## 2022-08-21 DIAGNOSIS — M109 Gout, unspecified: Secondary | ICD-10-CM | POA: Diagnosis not present

## 2022-08-21 DIAGNOSIS — I10 Essential (primary) hypertension: Secondary | ICD-10-CM | POA: Diagnosis not present

## 2022-08-21 DIAGNOSIS — I2510 Atherosclerotic heart disease of native coronary artery without angina pectoris: Secondary | ICD-10-CM | POA: Diagnosis not present

## 2022-08-21 DIAGNOSIS — S41112D Laceration without foreign body of left upper arm, subsequent encounter: Secondary | ICD-10-CM | POA: Diagnosis not present

## 2022-08-21 DIAGNOSIS — L03113 Cellulitis of right upper limb: Secondary | ICD-10-CM | POA: Diagnosis not present

## 2022-08-21 DIAGNOSIS — I4891 Unspecified atrial fibrillation: Secondary | ICD-10-CM | POA: Diagnosis not present

## 2022-08-22 DIAGNOSIS — M109 Gout, unspecified: Secondary | ICD-10-CM | POA: Diagnosis not present

## 2022-08-22 DIAGNOSIS — S41102A Unspecified open wound of left upper arm, initial encounter: Secondary | ICD-10-CM | POA: Diagnosis not present

## 2022-08-22 DIAGNOSIS — Z7952 Long term (current) use of systemic steroids: Secondary | ICD-10-CM | POA: Diagnosis not present

## 2022-08-22 DIAGNOSIS — Z7901 Long term (current) use of anticoagulants: Secondary | ICD-10-CM | POA: Diagnosis not present

## 2022-08-22 DIAGNOSIS — J449 Chronic obstructive pulmonary disease, unspecified: Secondary | ICD-10-CM | POA: Diagnosis not present

## 2022-08-22 DIAGNOSIS — Z79899 Other long term (current) drug therapy: Secondary | ICD-10-CM | POA: Diagnosis not present

## 2022-08-22 DIAGNOSIS — I10 Essential (primary) hypertension: Secondary | ICD-10-CM | POA: Diagnosis not present

## 2022-08-22 DIAGNOSIS — I2510 Atherosclerotic heart disease of native coronary artery without angina pectoris: Secondary | ICD-10-CM | POA: Diagnosis not present

## 2022-08-23 DIAGNOSIS — J4489 Other specified chronic obstructive pulmonary disease: Secondary | ICD-10-CM | POA: Diagnosis not present

## 2022-08-23 DIAGNOSIS — M109 Gout, unspecified: Secondary | ICD-10-CM | POA: Diagnosis not present

## 2022-08-23 DIAGNOSIS — I10 Essential (primary) hypertension: Secondary | ICD-10-CM | POA: Diagnosis not present

## 2022-08-23 DIAGNOSIS — I2510 Atherosclerotic heart disease of native coronary artery without angina pectoris: Secondary | ICD-10-CM | POA: Diagnosis not present

## 2022-08-23 DIAGNOSIS — D239 Other benign neoplasm of skin, unspecified: Secondary | ICD-10-CM | POA: Diagnosis not present

## 2022-08-23 DIAGNOSIS — N400 Enlarged prostate without lower urinary tract symptoms: Secondary | ICD-10-CM | POA: Diagnosis not present

## 2022-08-23 DIAGNOSIS — L03113 Cellulitis of right upper limb: Secondary | ICD-10-CM | POA: Diagnosis not present

## 2022-08-23 DIAGNOSIS — I4891 Unspecified atrial fibrillation: Secondary | ICD-10-CM | POA: Diagnosis not present

## 2022-08-23 DIAGNOSIS — S41112D Laceration without foreign body of left upper arm, subsequent encounter: Secondary | ICD-10-CM | POA: Diagnosis not present

## 2022-08-24 DIAGNOSIS — Z515 Encounter for palliative care: Secondary | ICD-10-CM | POA: Diagnosis not present

## 2022-08-24 DIAGNOSIS — F331 Major depressive disorder, recurrent, moderate: Secondary | ICD-10-CM | POA: Diagnosis not present

## 2022-08-24 DIAGNOSIS — I4891 Unspecified atrial fibrillation: Secondary | ICD-10-CM | POA: Diagnosis not present

## 2022-08-27 DIAGNOSIS — N400 Enlarged prostate without lower urinary tract symptoms: Secondary | ICD-10-CM | POA: Diagnosis not present

## 2022-08-27 DIAGNOSIS — L03113 Cellulitis of right upper limb: Secondary | ICD-10-CM | POA: Diagnosis not present

## 2022-08-27 DIAGNOSIS — D239 Other benign neoplasm of skin, unspecified: Secondary | ICD-10-CM | POA: Diagnosis not present

## 2022-08-27 DIAGNOSIS — I10 Essential (primary) hypertension: Secondary | ICD-10-CM | POA: Diagnosis not present

## 2022-08-27 DIAGNOSIS — J4489 Other specified chronic obstructive pulmonary disease: Secondary | ICD-10-CM | POA: Diagnosis not present

## 2022-08-27 DIAGNOSIS — I2510 Atherosclerotic heart disease of native coronary artery without angina pectoris: Secondary | ICD-10-CM | POA: Diagnosis not present

## 2022-08-27 DIAGNOSIS — I4891 Unspecified atrial fibrillation: Secondary | ICD-10-CM | POA: Diagnosis not present

## 2022-08-27 DIAGNOSIS — S41112D Laceration without foreign body of left upper arm, subsequent encounter: Secondary | ICD-10-CM | POA: Diagnosis not present

## 2022-08-27 DIAGNOSIS — M109 Gout, unspecified: Secondary | ICD-10-CM | POA: Diagnosis not present

## 2022-08-28 DIAGNOSIS — D239 Other benign neoplasm of skin, unspecified: Secondary | ICD-10-CM | POA: Diagnosis not present

## 2022-08-28 DIAGNOSIS — I4891 Unspecified atrial fibrillation: Secondary | ICD-10-CM | POA: Diagnosis not present

## 2022-08-28 DIAGNOSIS — S41112D Laceration without foreign body of left upper arm, subsequent encounter: Secondary | ICD-10-CM | POA: Diagnosis not present

## 2022-08-28 DIAGNOSIS — I2510 Atherosclerotic heart disease of native coronary artery without angina pectoris: Secondary | ICD-10-CM | POA: Diagnosis not present

## 2022-08-28 DIAGNOSIS — M109 Gout, unspecified: Secondary | ICD-10-CM | POA: Diagnosis not present

## 2022-08-28 DIAGNOSIS — I10 Essential (primary) hypertension: Secondary | ICD-10-CM | POA: Diagnosis not present

## 2022-08-28 DIAGNOSIS — J4489 Other specified chronic obstructive pulmonary disease: Secondary | ICD-10-CM | POA: Diagnosis not present

## 2022-08-28 DIAGNOSIS — N400 Enlarged prostate without lower urinary tract symptoms: Secondary | ICD-10-CM | POA: Diagnosis not present

## 2022-08-28 DIAGNOSIS — L03113 Cellulitis of right upper limb: Secondary | ICD-10-CM | POA: Diagnosis not present

## 2022-08-29 DIAGNOSIS — F331 Major depressive disorder, recurrent, moderate: Secondary | ICD-10-CM | POA: Diagnosis not present

## 2022-08-30 ENCOUNTER — Ambulatory Visit (INDEPENDENT_AMBULATORY_CARE_PROVIDER_SITE_OTHER): Payer: Medicare HMO | Admitting: Gastroenterology

## 2022-08-30 DIAGNOSIS — J4489 Other specified chronic obstructive pulmonary disease: Secondary | ICD-10-CM | POA: Diagnosis not present

## 2022-08-30 DIAGNOSIS — S41112D Laceration without foreign body of left upper arm, subsequent encounter: Secondary | ICD-10-CM | POA: Diagnosis not present

## 2022-08-30 DIAGNOSIS — I10 Essential (primary) hypertension: Secondary | ICD-10-CM | POA: Diagnosis not present

## 2022-08-30 DIAGNOSIS — L03113 Cellulitis of right upper limb: Secondary | ICD-10-CM | POA: Diagnosis not present

## 2022-08-30 DIAGNOSIS — N400 Enlarged prostate without lower urinary tract symptoms: Secondary | ICD-10-CM | POA: Diagnosis not present

## 2022-08-30 DIAGNOSIS — I4891 Unspecified atrial fibrillation: Secondary | ICD-10-CM | POA: Diagnosis not present

## 2022-08-30 DIAGNOSIS — M109 Gout, unspecified: Secondary | ICD-10-CM | POA: Diagnosis not present

## 2022-08-30 DIAGNOSIS — I2510 Atherosclerotic heart disease of native coronary artery without angina pectoris: Secondary | ICD-10-CM | POA: Diagnosis not present

## 2022-08-30 DIAGNOSIS — D239 Other benign neoplasm of skin, unspecified: Secondary | ICD-10-CM | POA: Diagnosis not present

## 2022-09-05 DIAGNOSIS — L03113 Cellulitis of right upper limb: Secondary | ICD-10-CM | POA: Diagnosis not present

## 2022-09-05 DIAGNOSIS — J4489 Other specified chronic obstructive pulmonary disease: Secondary | ICD-10-CM | POA: Diagnosis not present

## 2022-09-05 DIAGNOSIS — I10 Essential (primary) hypertension: Secondary | ICD-10-CM | POA: Diagnosis not present

## 2022-09-05 DIAGNOSIS — I4891 Unspecified atrial fibrillation: Secondary | ICD-10-CM | POA: Diagnosis not present

## 2022-09-05 DIAGNOSIS — I2510 Atherosclerotic heart disease of native coronary artery without angina pectoris: Secondary | ICD-10-CM | POA: Diagnosis not present

## 2022-09-05 DIAGNOSIS — M109 Gout, unspecified: Secondary | ICD-10-CM | POA: Diagnosis not present

## 2022-09-05 DIAGNOSIS — S41112D Laceration without foreign body of left upper arm, subsequent encounter: Secondary | ICD-10-CM | POA: Diagnosis not present

## 2022-09-05 DIAGNOSIS — D239 Other benign neoplasm of skin, unspecified: Secondary | ICD-10-CM | POA: Diagnosis not present

## 2022-09-05 DIAGNOSIS — N400 Enlarged prostate without lower urinary tract symptoms: Secondary | ICD-10-CM | POA: Diagnosis not present

## 2022-09-11 DIAGNOSIS — D239 Other benign neoplasm of skin, unspecified: Secondary | ICD-10-CM | POA: Diagnosis not present

## 2022-09-11 DIAGNOSIS — I4891 Unspecified atrial fibrillation: Secondary | ICD-10-CM | POA: Diagnosis not present

## 2022-09-11 DIAGNOSIS — M109 Gout, unspecified: Secondary | ICD-10-CM | POA: Diagnosis not present

## 2022-09-11 DIAGNOSIS — L03113 Cellulitis of right upper limb: Secondary | ICD-10-CM | POA: Diagnosis not present

## 2022-09-11 DIAGNOSIS — N400 Enlarged prostate without lower urinary tract symptoms: Secondary | ICD-10-CM | POA: Diagnosis not present

## 2022-09-11 DIAGNOSIS — I10 Essential (primary) hypertension: Secondary | ICD-10-CM | POA: Diagnosis not present

## 2022-09-11 DIAGNOSIS — S41112D Laceration without foreign body of left upper arm, subsequent encounter: Secondary | ICD-10-CM | POA: Diagnosis not present

## 2022-09-11 DIAGNOSIS — J4489 Other specified chronic obstructive pulmonary disease: Secondary | ICD-10-CM | POA: Diagnosis not present

## 2022-09-11 DIAGNOSIS — I2510 Atherosclerotic heart disease of native coronary artery without angina pectoris: Secondary | ICD-10-CM | POA: Diagnosis not present

## 2022-09-13 DIAGNOSIS — J4489 Other specified chronic obstructive pulmonary disease: Secondary | ICD-10-CM | POA: Diagnosis not present

## 2022-09-13 DIAGNOSIS — D239 Other benign neoplasm of skin, unspecified: Secondary | ICD-10-CM | POA: Diagnosis not present

## 2022-09-13 DIAGNOSIS — I4891 Unspecified atrial fibrillation: Secondary | ICD-10-CM | POA: Diagnosis not present

## 2022-09-13 DIAGNOSIS — N400 Enlarged prostate without lower urinary tract symptoms: Secondary | ICD-10-CM | POA: Diagnosis not present

## 2022-09-13 DIAGNOSIS — S41112D Laceration without foreign body of left upper arm, subsequent encounter: Secondary | ICD-10-CM | POA: Diagnosis not present

## 2022-09-13 DIAGNOSIS — I10 Essential (primary) hypertension: Secondary | ICD-10-CM | POA: Diagnosis not present

## 2022-09-13 DIAGNOSIS — M109 Gout, unspecified: Secondary | ICD-10-CM | POA: Diagnosis not present

## 2022-09-13 DIAGNOSIS — I2510 Atherosclerotic heart disease of native coronary artery without angina pectoris: Secondary | ICD-10-CM | POA: Diagnosis not present

## 2022-09-13 DIAGNOSIS — L03113 Cellulitis of right upper limb: Secondary | ICD-10-CM | POA: Diagnosis not present

## 2022-09-14 DIAGNOSIS — I10 Essential (primary) hypertension: Secondary | ICD-10-CM | POA: Diagnosis not present

## 2022-09-14 DIAGNOSIS — J4489 Other specified chronic obstructive pulmonary disease: Secondary | ICD-10-CM | POA: Diagnosis not present

## 2022-09-14 DIAGNOSIS — I2510 Atherosclerotic heart disease of native coronary artery without angina pectoris: Secondary | ICD-10-CM | POA: Diagnosis not present

## 2022-09-14 DIAGNOSIS — D239 Other benign neoplasm of skin, unspecified: Secondary | ICD-10-CM | POA: Diagnosis not present

## 2022-09-14 DIAGNOSIS — I4891 Unspecified atrial fibrillation: Secondary | ICD-10-CM | POA: Diagnosis not present

## 2022-09-14 DIAGNOSIS — S41112D Laceration without foreign body of left upper arm, subsequent encounter: Secondary | ICD-10-CM | POA: Diagnosis not present

## 2022-09-14 DIAGNOSIS — L03113 Cellulitis of right upper limb: Secondary | ICD-10-CM | POA: Diagnosis not present

## 2022-09-14 DIAGNOSIS — M109 Gout, unspecified: Secondary | ICD-10-CM | POA: Diagnosis not present

## 2022-09-14 DIAGNOSIS — N400 Enlarged prostate without lower urinary tract symptoms: Secondary | ICD-10-CM | POA: Diagnosis not present

## 2022-09-19 DIAGNOSIS — S41112D Laceration without foreign body of left upper arm, subsequent encounter: Secondary | ICD-10-CM | POA: Diagnosis not present

## 2022-09-19 DIAGNOSIS — N400 Enlarged prostate without lower urinary tract symptoms: Secondary | ICD-10-CM | POA: Diagnosis not present

## 2022-09-19 DIAGNOSIS — M109 Gout, unspecified: Secondary | ICD-10-CM | POA: Diagnosis not present

## 2022-09-19 DIAGNOSIS — D239 Other benign neoplasm of skin, unspecified: Secondary | ICD-10-CM | POA: Diagnosis not present

## 2022-09-19 DIAGNOSIS — I2510 Atherosclerotic heart disease of native coronary artery without angina pectoris: Secondary | ICD-10-CM | POA: Diagnosis not present

## 2022-09-19 DIAGNOSIS — L03113 Cellulitis of right upper limb: Secondary | ICD-10-CM | POA: Diagnosis not present

## 2022-09-19 DIAGNOSIS — I10 Essential (primary) hypertension: Secondary | ICD-10-CM | POA: Diagnosis not present

## 2022-09-19 DIAGNOSIS — I4891 Unspecified atrial fibrillation: Secondary | ICD-10-CM | POA: Diagnosis not present

## 2022-09-19 DIAGNOSIS — J4489 Other specified chronic obstructive pulmonary disease: Secondary | ICD-10-CM | POA: Diagnosis not present

## 2022-09-20 DIAGNOSIS — I4891 Unspecified atrial fibrillation: Secondary | ICD-10-CM | POA: Diagnosis not present

## 2022-09-20 DIAGNOSIS — S41112D Laceration without foreign body of left upper arm, subsequent encounter: Secondary | ICD-10-CM | POA: Diagnosis not present

## 2022-09-20 DIAGNOSIS — I10 Essential (primary) hypertension: Secondary | ICD-10-CM | POA: Diagnosis not present

## 2022-09-20 DIAGNOSIS — M109 Gout, unspecified: Secondary | ICD-10-CM | POA: Diagnosis not present

## 2022-09-20 DIAGNOSIS — J4489 Other specified chronic obstructive pulmonary disease: Secondary | ICD-10-CM | POA: Diagnosis not present

## 2022-09-20 DIAGNOSIS — D239 Other benign neoplasm of skin, unspecified: Secondary | ICD-10-CM | POA: Diagnosis not present

## 2022-09-20 DIAGNOSIS — I2510 Atherosclerotic heart disease of native coronary artery without angina pectoris: Secondary | ICD-10-CM | POA: Diagnosis not present

## 2022-09-20 DIAGNOSIS — L03113 Cellulitis of right upper limb: Secondary | ICD-10-CM | POA: Diagnosis not present

## 2022-09-20 DIAGNOSIS — N400 Enlarged prostate without lower urinary tract symptoms: Secondary | ICD-10-CM | POA: Diagnosis not present

## 2022-09-26 DIAGNOSIS — D239 Other benign neoplasm of skin, unspecified: Secondary | ICD-10-CM | POA: Diagnosis not present

## 2022-09-26 DIAGNOSIS — I4891 Unspecified atrial fibrillation: Secondary | ICD-10-CM | POA: Diagnosis not present

## 2022-09-26 DIAGNOSIS — L03113 Cellulitis of right upper limb: Secondary | ICD-10-CM | POA: Diagnosis not present

## 2022-09-26 DIAGNOSIS — N400 Enlarged prostate without lower urinary tract symptoms: Secondary | ICD-10-CM | POA: Diagnosis not present

## 2022-09-26 DIAGNOSIS — I2510 Atherosclerotic heart disease of native coronary artery without angina pectoris: Secondary | ICD-10-CM | POA: Diagnosis not present

## 2022-09-26 DIAGNOSIS — M109 Gout, unspecified: Secondary | ICD-10-CM | POA: Diagnosis not present

## 2022-09-26 DIAGNOSIS — I10 Essential (primary) hypertension: Secondary | ICD-10-CM | POA: Diagnosis not present

## 2022-09-26 DIAGNOSIS — S41112D Laceration without foreign body of left upper arm, subsequent encounter: Secondary | ICD-10-CM | POA: Diagnosis not present

## 2022-09-26 DIAGNOSIS — J4489 Other specified chronic obstructive pulmonary disease: Secondary | ICD-10-CM | POA: Diagnosis not present

## 2022-10-01 DIAGNOSIS — D239 Other benign neoplasm of skin, unspecified: Secondary | ICD-10-CM | POA: Diagnosis not present

## 2022-10-01 DIAGNOSIS — N400 Enlarged prostate without lower urinary tract symptoms: Secondary | ICD-10-CM | POA: Diagnosis not present

## 2022-10-01 DIAGNOSIS — I10 Essential (primary) hypertension: Secondary | ICD-10-CM | POA: Diagnosis not present

## 2022-10-01 DIAGNOSIS — I2510 Atherosclerotic heart disease of native coronary artery without angina pectoris: Secondary | ICD-10-CM | POA: Diagnosis not present

## 2022-10-01 DIAGNOSIS — S41112D Laceration without foreign body of left upper arm, subsequent encounter: Secondary | ICD-10-CM | POA: Diagnosis not present

## 2022-10-01 DIAGNOSIS — I4891 Unspecified atrial fibrillation: Secondary | ICD-10-CM | POA: Diagnosis not present

## 2022-10-01 DIAGNOSIS — M109 Gout, unspecified: Secondary | ICD-10-CM | POA: Diagnosis not present

## 2022-10-01 DIAGNOSIS — J4489 Other specified chronic obstructive pulmonary disease: Secondary | ICD-10-CM | POA: Diagnosis not present

## 2022-10-01 DIAGNOSIS — L03113 Cellulitis of right upper limb: Secondary | ICD-10-CM | POA: Diagnosis not present

## 2022-10-09 ENCOUNTER — Ambulatory Visit (INDEPENDENT_AMBULATORY_CARE_PROVIDER_SITE_OTHER): Payer: Medicare HMO | Admitting: Gastroenterology

## 2022-10-15 ENCOUNTER — Telehealth: Payer: Self-pay | Admitting: Cardiology

## 2022-10-15 MED ORDER — APIXABAN 5 MG PO TABS: 5.0000 mg | ORAL_TABLET | Freq: Two times a day (BID) | ORAL | 0 refills | Status: DC

## 2022-10-15 NOTE — Telephone Encounter (Signed)
Pt came in office asking if he could get Eliquis 5 mg samples.

## 2022-10-16 DIAGNOSIS — Z299 Encounter for prophylactic measures, unspecified: Secondary | ICD-10-CM | POA: Diagnosis not present

## 2022-10-16 DIAGNOSIS — J441 Chronic obstructive pulmonary disease with (acute) exacerbation: Secondary | ICD-10-CM | POA: Diagnosis not present

## 2022-10-16 DIAGNOSIS — I10 Essential (primary) hypertension: Secondary | ICD-10-CM | POA: Diagnosis not present

## 2022-10-20 DIAGNOSIS — J810 Acute pulmonary edema: Secondary | ICD-10-CM | POA: Diagnosis not present

## 2022-10-20 DIAGNOSIS — F331 Major depressive disorder, recurrent, moderate: Secondary | ICD-10-CM | POA: Diagnosis not present

## 2022-10-20 DIAGNOSIS — I2510 Atherosclerotic heart disease of native coronary artery without angina pectoris: Secondary | ICD-10-CM | POA: Diagnosis not present

## 2022-10-20 DIAGNOSIS — I700 Atherosclerosis of aorta: Secondary | ICD-10-CM | POA: Diagnosis not present

## 2022-10-20 DIAGNOSIS — I3481 Nonrheumatic mitral (valve) annulus calcification: Secondary | ICD-10-CM | POA: Diagnosis not present

## 2022-10-20 DIAGNOSIS — F339 Major depressive disorder, recurrent, unspecified: Secondary | ICD-10-CM | POA: Diagnosis not present

## 2022-10-20 DIAGNOSIS — I5033 Acute on chronic diastolic (congestive) heart failure: Secondary | ICD-10-CM | POA: Diagnosis not present

## 2022-10-20 DIAGNOSIS — I509 Heart failure, unspecified: Secondary | ICD-10-CM | POA: Diagnosis not present

## 2022-10-20 DIAGNOSIS — I480 Paroxysmal atrial fibrillation: Secondary | ICD-10-CM | POA: Diagnosis not present

## 2022-10-20 DIAGNOSIS — R600 Localized edema: Secondary | ICD-10-CM | POA: Diagnosis not present

## 2022-10-20 DIAGNOSIS — R7989 Other specified abnormal findings of blood chemistry: Secondary | ICD-10-CM | POA: Diagnosis not present

## 2022-10-20 DIAGNOSIS — I501 Left ventricular failure: Secondary | ICD-10-CM | POA: Diagnosis not present

## 2022-10-20 DIAGNOSIS — J90 Pleural effusion, not elsewhere classified: Secondary | ICD-10-CM | POA: Diagnosis not present

## 2022-10-20 DIAGNOSIS — F419 Anxiety disorder, unspecified: Secondary | ICD-10-CM | POA: Diagnosis not present

## 2022-10-20 DIAGNOSIS — I110 Hypertensive heart disease with heart failure: Secondary | ICD-10-CM | POA: Diagnosis not present

## 2022-10-20 DIAGNOSIS — J189 Pneumonia, unspecified organism: Secondary | ICD-10-CM | POA: Diagnosis not present

## 2022-10-20 DIAGNOSIS — R0602 Shortness of breath: Secondary | ICD-10-CM | POA: Diagnosis not present

## 2022-10-20 DIAGNOSIS — R0609 Other forms of dyspnea: Secondary | ICD-10-CM | POA: Diagnosis not present

## 2022-10-20 DIAGNOSIS — N289 Disorder of kidney and ureter, unspecified: Secondary | ICD-10-CM | POA: Diagnosis not present

## 2022-10-20 DIAGNOSIS — I4891 Unspecified atrial fibrillation: Secondary | ICD-10-CM | POA: Diagnosis not present

## 2022-10-20 DIAGNOSIS — R0600 Dyspnea, unspecified: Secondary | ICD-10-CM | POA: Diagnosis not present

## 2022-10-20 DIAGNOSIS — J811 Chronic pulmonary edema: Secondary | ICD-10-CM | POA: Diagnosis not present

## 2022-10-20 DIAGNOSIS — Z20822 Contact with and (suspected) exposure to covid-19: Secondary | ICD-10-CM | POA: Diagnosis not present

## 2022-10-20 DIAGNOSIS — D692 Other nonthrombocytopenic purpura: Secondary | ICD-10-CM | POA: Diagnosis not present

## 2022-10-20 DIAGNOSIS — R918 Other nonspecific abnormal finding of lung field: Secondary | ICD-10-CM | POA: Diagnosis not present

## 2022-10-25 DIAGNOSIS — D239 Other benign neoplasm of skin, unspecified: Secondary | ICD-10-CM | POA: Diagnosis not present

## 2022-10-25 DIAGNOSIS — I509 Heart failure, unspecified: Secondary | ICD-10-CM | POA: Diagnosis not present

## 2022-10-25 DIAGNOSIS — I110 Hypertensive heart disease with heart failure: Secondary | ICD-10-CM | POA: Diagnosis not present

## 2022-10-25 DIAGNOSIS — F331 Major depressive disorder, recurrent, moderate: Secondary | ICD-10-CM | POA: Diagnosis not present

## 2022-10-25 DIAGNOSIS — I25118 Atherosclerotic heart disease of native coronary artery with other forms of angina pectoris: Secondary | ICD-10-CM | POA: Diagnosis not present

## 2022-10-25 DIAGNOSIS — I4891 Unspecified atrial fibrillation: Secondary | ICD-10-CM | POA: Diagnosis not present

## 2022-10-25 DIAGNOSIS — D6869 Other thrombophilia: Secondary | ICD-10-CM | POA: Diagnosis not present

## 2022-10-25 DIAGNOSIS — F419 Anxiety disorder, unspecified: Secondary | ICD-10-CM | POA: Diagnosis not present

## 2022-10-25 DIAGNOSIS — D692 Other nonthrombocytopenic purpura: Secondary | ICD-10-CM | POA: Diagnosis not present

## 2022-10-30 ENCOUNTER — Telehealth: Payer: Self-pay | Admitting: Cardiology

## 2022-10-30 DIAGNOSIS — F331 Major depressive disorder, recurrent, moderate: Secondary | ICD-10-CM | POA: Diagnosis not present

## 2022-10-30 DIAGNOSIS — I509 Heart failure, unspecified: Secondary | ICD-10-CM | POA: Diagnosis not present

## 2022-10-30 DIAGNOSIS — D692 Other nonthrombocytopenic purpura: Secondary | ICD-10-CM | POA: Diagnosis not present

## 2022-10-30 DIAGNOSIS — I110 Hypertensive heart disease with heart failure: Secondary | ICD-10-CM | POA: Diagnosis not present

## 2022-10-30 DIAGNOSIS — F419 Anxiety disorder, unspecified: Secondary | ICD-10-CM | POA: Diagnosis not present

## 2022-10-30 DIAGNOSIS — I25118 Atherosclerotic heart disease of native coronary artery with other forms of angina pectoris: Secondary | ICD-10-CM | POA: Diagnosis not present

## 2022-10-30 DIAGNOSIS — D6869 Other thrombophilia: Secondary | ICD-10-CM | POA: Diagnosis not present

## 2022-10-30 DIAGNOSIS — I4891 Unspecified atrial fibrillation: Secondary | ICD-10-CM | POA: Diagnosis not present

## 2022-10-30 DIAGNOSIS — D239 Other benign neoplasm of skin, unspecified: Secondary | ICD-10-CM | POA: Diagnosis not present

## 2022-10-30 NOTE — Telephone Encounter (Signed)
Spoke with [facility] Nurse regarding patient's symptoms. Reported that his heart rate was 98-120 resting and having some sternal pain with taking deep breaths. She asked the patient did he want to go to the ED and he refused. I advised her that I will call patient to follow up. She stated that his BP was 114/80.  Called patient and he reported the same symptoms. Along with light headed when standing. No SOB currently and only chest pain he has when he takes deep breaths. Patient doesn't have a way to monitor his BP and refused going to the ED due to financial situation. He has taking his Eliquis and Diltiazem as prescribed today. Will be seeing his PCP on tomorrow, he said he will let him know if his symptoms and how he was feeling.

## 2022-10-30 NOTE — Telephone Encounter (Signed)
Mallory from [facility] home health called to say patient is having a resting heart rate ranging of 98-120.  She states he is having sternal pain when he takes deep breathes, and is dizzy and lightheaded when walking.

## 2022-10-31 DIAGNOSIS — Z7901 Long term (current) use of anticoagulants: Secondary | ICD-10-CM | POA: Diagnosis not present

## 2022-10-31 DIAGNOSIS — I2510 Atherosclerotic heart disease of native coronary artery without angina pectoris: Secondary | ICD-10-CM | POA: Diagnosis not present

## 2022-10-31 DIAGNOSIS — I10 Essential (primary) hypertension: Secondary | ICD-10-CM | POA: Diagnosis not present

## 2022-10-31 DIAGNOSIS — S2220XA Unspecified fracture of sternum, initial encounter for closed fracture: Secondary | ICD-10-CM | POA: Diagnosis not present

## 2022-10-31 DIAGNOSIS — I4891 Unspecified atrial fibrillation: Secondary | ICD-10-CM | POA: Diagnosis not present

## 2022-10-31 DIAGNOSIS — W19XXXA Unspecified fall, initial encounter: Secondary | ICD-10-CM | POA: Diagnosis not present

## 2022-10-31 DIAGNOSIS — S62521A Displaced fracture of distal phalanx of right thumb, initial encounter for closed fracture: Secondary | ICD-10-CM | POA: Diagnosis not present

## 2022-10-31 DIAGNOSIS — I6201 Nontraumatic acute subdural hemorrhage: Secondary | ICD-10-CM | POA: Diagnosis not present

## 2022-10-31 DIAGNOSIS — I700 Atherosclerosis of aorta: Secondary | ICD-10-CM | POA: Diagnosis not present

## 2022-10-31 DIAGNOSIS — R410 Disorientation, unspecified: Secondary | ICD-10-CM | POA: Diagnosis not present

## 2022-10-31 DIAGNOSIS — S62501A Fracture of unspecified phalanx of right thumb, initial encounter for closed fracture: Secondary | ICD-10-CM | POA: Diagnosis not present

## 2022-10-31 DIAGNOSIS — S2222XD Fracture of body of sternum, subsequent encounter for fracture with routine healing: Secondary | ICD-10-CM | POA: Diagnosis not present

## 2022-10-31 DIAGNOSIS — W1839XA Other fall on same level, initial encounter: Secondary | ICD-10-CM | POA: Diagnosis not present

## 2022-10-31 DIAGNOSIS — S61521A Laceration with foreign body of right wrist, initial encounter: Secondary | ICD-10-CM | POA: Diagnosis not present

## 2022-10-31 DIAGNOSIS — S2222XA Fracture of body of sternum, initial encounter for closed fracture: Secondary | ICD-10-CM | POA: Diagnosis not present

## 2022-10-31 DIAGNOSIS — R52 Pain, unspecified: Secondary | ICD-10-CM | POA: Diagnosis not present

## 2022-10-31 DIAGNOSIS — I5032 Chronic diastolic (congestive) heart failure: Secondary | ICD-10-CM | POA: Diagnosis not present

## 2022-10-31 DIAGNOSIS — Z043 Encounter for examination and observation following other accident: Secondary | ICD-10-CM | POA: Diagnosis not present

## 2022-10-31 DIAGNOSIS — R296 Repeated falls: Secondary | ICD-10-CM | POA: Diagnosis not present

## 2022-10-31 DIAGNOSIS — I5022 Chronic systolic (congestive) heart failure: Secondary | ICD-10-CM | POA: Diagnosis not present

## 2022-10-31 DIAGNOSIS — S62524D Nondisplaced fracture of distal phalanx of right thumb, subsequent encounter for fracture with routine healing: Secondary | ICD-10-CM | POA: Diagnosis not present

## 2022-10-31 DIAGNOSIS — E440 Moderate protein-calorie malnutrition: Secondary | ICD-10-CM | POA: Diagnosis not present

## 2022-10-31 DIAGNOSIS — F419 Anxiety disorder, unspecified: Secondary | ICD-10-CM | POA: Diagnosis not present

## 2022-10-31 DIAGNOSIS — K8020 Calculus of gallbladder without cholecystitis without obstruction: Secondary | ICD-10-CM | POA: Diagnosis not present

## 2022-10-31 DIAGNOSIS — R54 Age-related physical debility: Secondary | ICD-10-CM | POA: Diagnosis not present

## 2022-10-31 DIAGNOSIS — I6523 Occlusion and stenosis of bilateral carotid arteries: Secondary | ICD-10-CM | POA: Diagnosis not present

## 2022-10-31 DIAGNOSIS — R55 Syncope and collapse: Secondary | ICD-10-CM | POA: Diagnosis not present

## 2022-10-31 DIAGNOSIS — I110 Hypertensive heart disease with heart failure: Secondary | ICD-10-CM | POA: Diagnosis not present

## 2022-10-31 DIAGNOSIS — R000 Tachycardia, unspecified: Secondary | ICD-10-CM | POA: Diagnosis not present

## 2022-10-31 DIAGNOSIS — F411 Generalized anxiety disorder: Secondary | ICD-10-CM | POA: Diagnosis not present

## 2022-10-31 DIAGNOSIS — E785 Hyperlipidemia, unspecified: Secondary | ICD-10-CM | POA: Diagnosis not present

## 2022-10-31 DIAGNOSIS — R079 Chest pain, unspecified: Secondary | ICD-10-CM | POA: Diagnosis not present

## 2022-10-31 DIAGNOSIS — E860 Dehydration: Secondary | ICD-10-CM | POA: Diagnosis not present

## 2022-10-31 DIAGNOSIS — S62639A Displaced fracture of distal phalanx of unspecified finger, initial encounter for closed fracture: Secondary | ICD-10-CM | POA: Diagnosis not present

## 2022-10-31 DIAGNOSIS — J449 Chronic obstructive pulmonary disease, unspecified: Secondary | ICD-10-CM | POA: Diagnosis not present

## 2022-10-31 DIAGNOSIS — R531 Weakness: Secondary | ICD-10-CM | POA: Diagnosis not present

## 2022-10-31 DIAGNOSIS — I499 Cardiac arrhythmia, unspecified: Secondary | ICD-10-CM | POA: Diagnosis not present

## 2022-10-31 DIAGNOSIS — R42 Dizziness and giddiness: Secondary | ICD-10-CM | POA: Diagnosis not present

## 2022-10-31 DIAGNOSIS — M1811 Unilateral primary osteoarthritis of first carpometacarpal joint, right hand: Secondary | ICD-10-CM | POA: Diagnosis not present

## 2022-10-31 DIAGNOSIS — G9389 Other specified disorders of brain: Secondary | ICD-10-CM | POA: Diagnosis not present

## 2022-11-06 DIAGNOSIS — I25118 Atherosclerotic heart disease of native coronary artery with other forms of angina pectoris: Secondary | ICD-10-CM | POA: Diagnosis not present

## 2022-11-06 DIAGNOSIS — I509 Heart failure, unspecified: Secondary | ICD-10-CM | POA: Diagnosis not present

## 2022-11-06 DIAGNOSIS — K8020 Calculus of gallbladder without cholecystitis without obstruction: Secondary | ICD-10-CM | POA: Diagnosis not present

## 2022-11-06 DIAGNOSIS — I2510 Atherosclerotic heart disease of native coronary artery without angina pectoris: Secondary | ICD-10-CM | POA: Diagnosis not present

## 2022-11-06 DIAGNOSIS — R531 Weakness: Secondary | ICD-10-CM | POA: Diagnosis not present

## 2022-11-06 DIAGNOSIS — R2681 Unsteadiness on feet: Secondary | ICD-10-CM | POA: Diagnosis not present

## 2022-11-06 DIAGNOSIS — E860 Dehydration: Secondary | ICD-10-CM | POA: Diagnosis not present

## 2022-11-06 DIAGNOSIS — R42 Dizziness and giddiness: Secondary | ICD-10-CM | POA: Diagnosis not present

## 2022-11-06 DIAGNOSIS — D6869 Other thrombophilia: Secondary | ICD-10-CM | POA: Diagnosis not present

## 2022-11-06 DIAGNOSIS — R296 Repeated falls: Secondary | ICD-10-CM | POA: Diagnosis not present

## 2022-11-06 DIAGNOSIS — Z96641 Presence of right artificial hip joint: Secondary | ICD-10-CM | POA: Diagnosis not present

## 2022-11-06 DIAGNOSIS — I4891 Unspecified atrial fibrillation: Secondary | ICD-10-CM | POA: Diagnosis not present

## 2022-11-06 DIAGNOSIS — S62524D Nondisplaced fracture of distal phalanx of right thumb, subsequent encounter for fracture with routine healing: Secondary | ICD-10-CM | POA: Diagnosis not present

## 2022-11-06 DIAGNOSIS — R55 Syncope and collapse: Secondary | ICD-10-CM | POA: Diagnosis not present

## 2022-11-06 DIAGNOSIS — J449 Chronic obstructive pulmonary disease, unspecified: Secondary | ICD-10-CM | POA: Diagnosis not present

## 2022-11-06 DIAGNOSIS — I110 Hypertensive heart disease with heart failure: Secondary | ICD-10-CM | POA: Diagnosis not present

## 2022-11-06 DIAGNOSIS — D692 Other nonthrombocytopenic purpura: Secondary | ICD-10-CM | POA: Diagnosis not present

## 2022-11-06 DIAGNOSIS — S2222XA Fracture of body of sternum, initial encounter for closed fracture: Secondary | ICD-10-CM | POA: Diagnosis not present

## 2022-11-06 DIAGNOSIS — I10 Essential (primary) hypertension: Secondary | ICD-10-CM | POA: Diagnosis not present

## 2022-11-06 DIAGNOSIS — E440 Moderate protein-calorie malnutrition: Secondary | ICD-10-CM | POA: Diagnosis not present

## 2022-11-06 DIAGNOSIS — S2222XD Fracture of body of sternum, subsequent encounter for fracture with routine healing: Secondary | ICD-10-CM | POA: Diagnosis not present

## 2022-11-06 DIAGNOSIS — F419 Anxiety disorder, unspecified: Secondary | ICD-10-CM | POA: Diagnosis not present

## 2022-11-06 DIAGNOSIS — W19XXXA Unspecified fall, initial encounter: Secondary | ICD-10-CM | POA: Diagnosis not present

## 2022-11-06 DIAGNOSIS — N501 Vascular disorders of male genital organs: Secondary | ICD-10-CM | POA: Diagnosis not present

## 2022-11-06 DIAGNOSIS — R54 Age-related physical debility: Secondary | ICD-10-CM | POA: Diagnosis not present

## 2022-11-06 DIAGNOSIS — R319 Hematuria, unspecified: Secondary | ICD-10-CM | POA: Diagnosis not present

## 2022-11-06 DIAGNOSIS — E785 Hyperlipidemia, unspecified: Secondary | ICD-10-CM | POA: Diagnosis not present

## 2022-11-06 DIAGNOSIS — F331 Major depressive disorder, recurrent, moderate: Secondary | ICD-10-CM | POA: Diagnosis not present

## 2022-11-06 DIAGNOSIS — K5730 Diverticulosis of large intestine without perforation or abscess without bleeding: Secondary | ICD-10-CM | POA: Diagnosis not present

## 2022-11-06 DIAGNOSIS — D239 Other benign neoplasm of skin, unspecified: Secondary | ICD-10-CM | POA: Diagnosis not present

## 2022-11-06 DIAGNOSIS — I5022 Chronic systolic (congestive) heart failure: Secondary | ICD-10-CM | POA: Diagnosis not present

## 2022-11-11 DIAGNOSIS — R42 Dizziness and giddiness: Secondary | ICD-10-CM | POA: Diagnosis not present

## 2022-11-11 DIAGNOSIS — N501 Vascular disorders of male genital organs: Secondary | ICD-10-CM | POA: Diagnosis not present

## 2022-11-20 DIAGNOSIS — I509 Heart failure, unspecified: Secondary | ICD-10-CM | POA: Diagnosis not present

## 2022-11-20 DIAGNOSIS — I4891 Unspecified atrial fibrillation: Secondary | ICD-10-CM | POA: Diagnosis not present

## 2022-11-20 DIAGNOSIS — F331 Major depressive disorder, recurrent, moderate: Secondary | ICD-10-CM | POA: Diagnosis not present

## 2022-11-20 DIAGNOSIS — I110 Hypertensive heart disease with heart failure: Secondary | ICD-10-CM | POA: Diagnosis not present

## 2022-11-22 ENCOUNTER — Other Ambulatory Visit: Payer: Medicare HMO

## 2022-11-23 DIAGNOSIS — R319 Hematuria, unspecified: Secondary | ICD-10-CM | POA: Diagnosis not present

## 2022-11-23 DIAGNOSIS — Z96641 Presence of right artificial hip joint: Secondary | ICD-10-CM | POA: Diagnosis not present

## 2022-11-23 DIAGNOSIS — I2510 Atherosclerotic heart disease of native coronary artery without angina pectoris: Secondary | ICD-10-CM | POA: Diagnosis not present

## 2022-11-23 DIAGNOSIS — K5730 Diverticulosis of large intestine without perforation or abscess without bleeding: Secondary | ICD-10-CM | POA: Diagnosis not present

## 2022-11-23 DIAGNOSIS — K8020 Calculus of gallbladder without cholecystitis without obstruction: Secondary | ICD-10-CM | POA: Diagnosis not present

## 2022-11-28 ENCOUNTER — Encounter: Payer: Self-pay | Admitting: *Deleted

## 2022-11-29 ENCOUNTER — Encounter: Payer: Self-pay | Admitting: Cardiology

## 2022-11-29 ENCOUNTER — Ambulatory Visit: Payer: Medicare HMO | Attending: Cardiology | Admitting: Cardiology

## 2022-11-29 NOTE — Progress Notes (Deleted)
Cardiology Office Note  Date: 11/29/2022   ID: Jackson Martin, DOB 01-05-1941, MRN [id number]  History of Present Illness: Jackson Martin is an 82 y.o. male last seen in March.  Records indicate hospitalization at [facility] in August following prior fall with closed sternal fracture and right thumb fracture.  He had difficulty with rapid atrial fibrillation during that time as well, similar to prior hospitalizations.  He was ultimately discharged to SNF.  Follow-up echocardiogram at [facility] in August revealed LVEF approximately 50%, moderate mitral regurgitation, mild aortic regurgitation, mild to moderate aortic stenosis, and moderate tricuspid regurgitation.  Physical Exam: VS:  There were no vitals taken for this visit., BMI There is no height or weight on file to calculate BMI.  Wt Readings from Last 3 Encounters:  05/22/22 143 lb (64.9 kg)  11/10/21 145 lb 12.8 oz (66.1 kg)  10/20/21 147 lb (66.7 kg)    General: Patient appears comfortable at rest. HEENT: Conjunctiva and lids normal, oropharynx clear with moist mucosa. Neck: Supple, no elevated JVP or carotid bruits, no thyromegaly. Lungs: Clear to auscultation, nonlabored breathing at rest. Cardiac: Regular rate and rhythm, no S3 or significant systolic murmur, no pericardial rub. Abdomen: Soft, nontender, no hepatomegaly, bowel sounds present, no guarding or rebound. Extremities: No pitting edema, distal pulses 2+. Skin: Warm and dry. Musculoskeletal: No kyphosis. Neuropsychiatric: Alert and oriented x3, affect grossly appropriate.  ECG:  An ECG dated 10/31/2022 was personally reviewed today and demonstrated:  Atrial fibrillation with RVR, right bundle branch block.  Labwork:  September 2024: Hemoglobin 10.0, platelets 190, potassium 4.1, BUN 22, creatinine 1.41, AST 14, ALT 15  Other Studies Reviewed Today:  Lexiscan Myoview 10/03/2021: IMPRESSION: 1. Fixed defect in the inferior wall consistent  with scar versus diaphragmatic attenuation. No wall motion abnormality favors attenuation.   2. Normal left ventricular wall motion.   3. Left ventricular ejection fraction 69%   4. Non invasive risk stratification*: Low  Echocardiogram 10/22/2022 [facility]): Summary   1. The left ventricle is upper normal in size with normal wall thickness.    2. The left ventricular systolic function is borderline, LVEF is visually  estimated at 50%.    3. The mitral valve leaflets are mildly thickened with normal leaflet  mobility.   4. Mitral annular calcification is present.    5. There is moderate mitral valve regurgitation.    6. There is mild aortic regurgitation.    7. There is likely mild to moderate aortic valve stenosis due to low-flow,  low-gradient physiology.    8. The left atrium is moderately dilated in size.    9. The right ventricle is mildly dilated in size, with mildly to moderately  reduced systolic function.    10. There is moderate tricuspid regurgitation.    11. There is at least mild pulmonary hypertension.    12. There is mild to moderate pulmonic regurgitation.    13. The right atrium is moderately dilated in size.   Assessment and Plan:  1.  Permanent atrial fibrillation with CHA2DS2-VASc score of 4.   2.  Mild to moderate degenerative calcific aortic stenosis with mild aortic regurgitation by follow-up echocardiogram at [facility] in August.  Mean AV gradient 7 mmHg and dimensionless index 0.4.   3.  Essential hypertension..  Disposition:  Follow up {follow up:15908}  Signed, Jonelle Sidle, M.D., F.A.C.C. [facility]

## 2022-12-02 IMAGING — CT CT ABD-PELV W/ CM
2 of 5 series · 16 of 46 positions shown, 18 images · IV contrast (Omnipaque or Isovue)
Comparison: CT Abdomen and Pelvis 09/02/2019 and earlier.

CLINICAL DATA: 79-year-old male with lower abdominal pain beginning
1 month after colonoscopy.

EXAM:
CT ABDOMEN AND PELVIS WITH CONTRAST
TECHNIQUE: Multidetector CT imaging of the abdomen and pelvis was performed
using the standard protocol following bolus administration of
intravenous contrast.
CONTRAST:  100mL OMNIPAQUE IOHEXOL 300 MG/ML  SOLN

[Series 2: axial st · axial · 0.89mm/px · z∈[+614,+1004]mm · 13 of 88 slices shown, 15 images]
[im 5/88  soft-tissue]
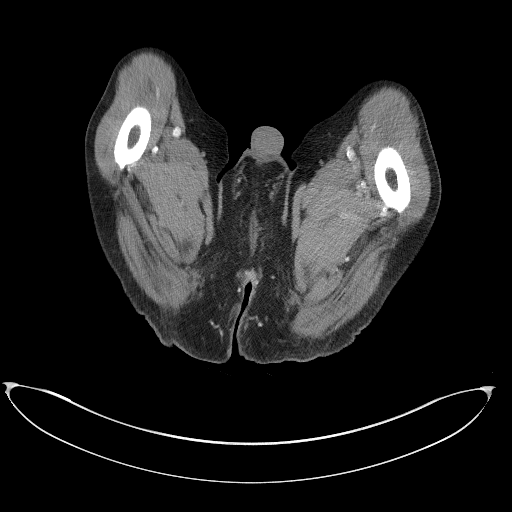
[im 5/88  bone]
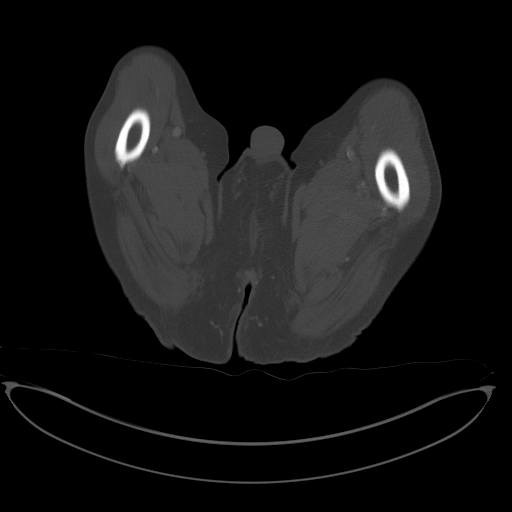
[im 14/88  soft-tissue]
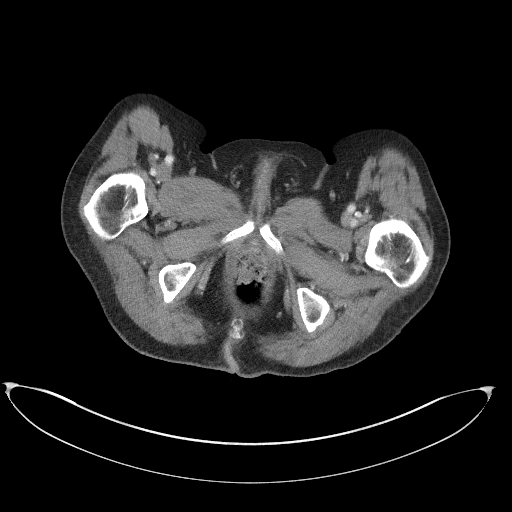
[im 19/88  soft-tissue]
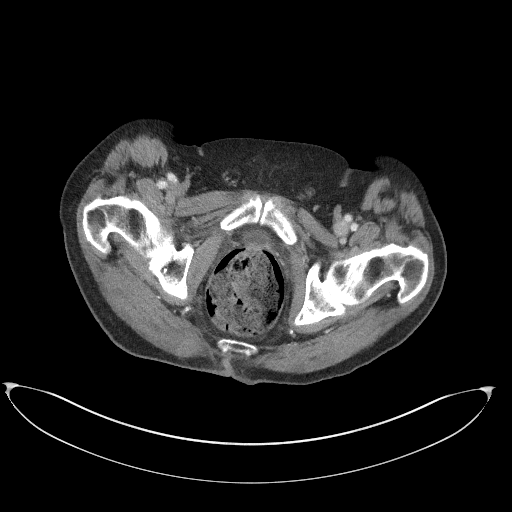
[im 23/88  soft-tissue]
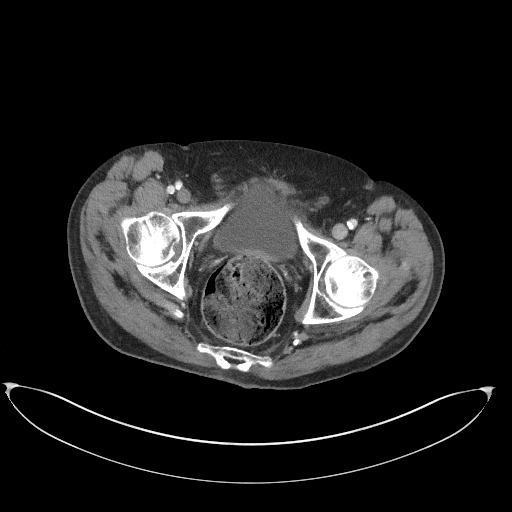
[im 33/88  soft-tissue]
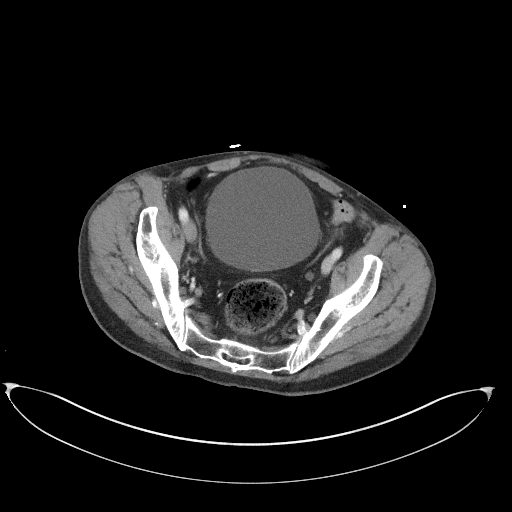
[im 37/88  soft-tissue]
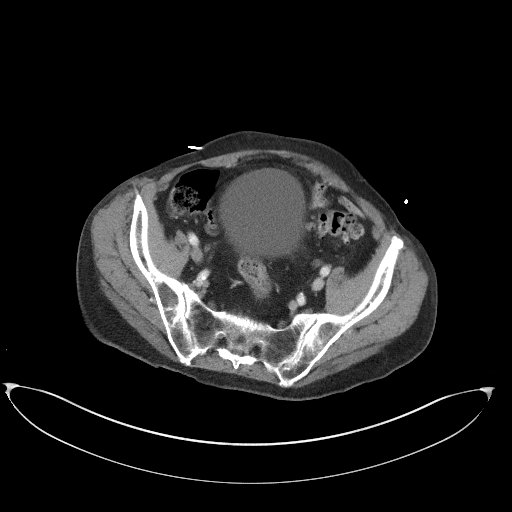
[im 46/88  soft-tissue]
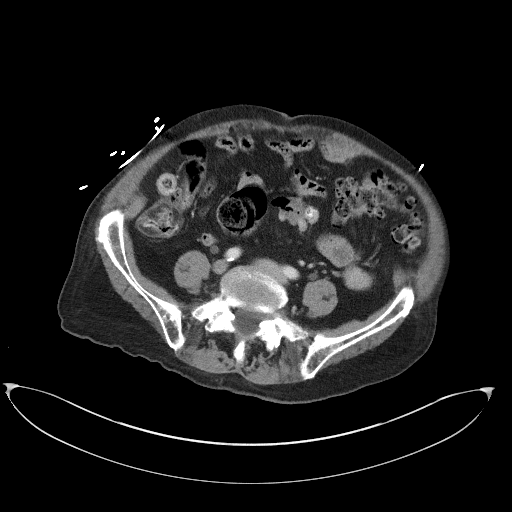
[im 51/88  soft-tissue]
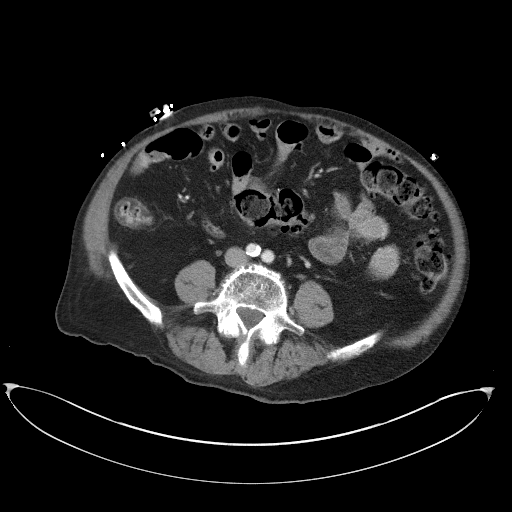
[im 55/88  soft-tissue]
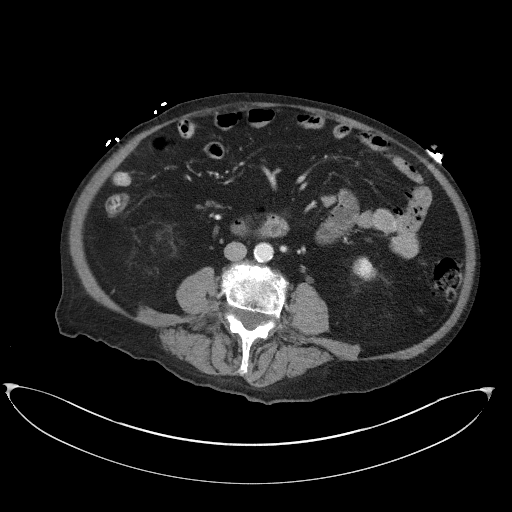
[im 55/88  bone]
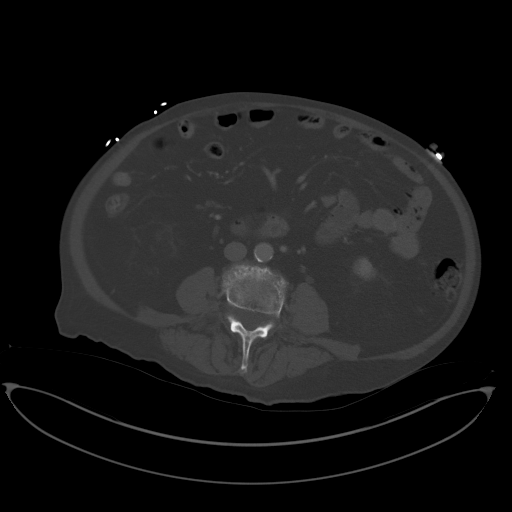
[im 65/88  soft-tissue]
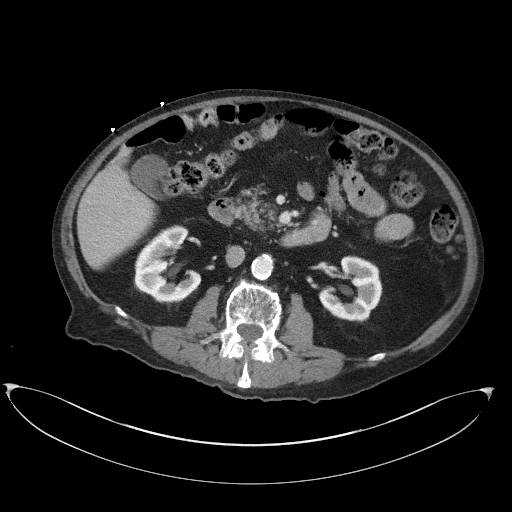
[im 69/88  soft-tissue]
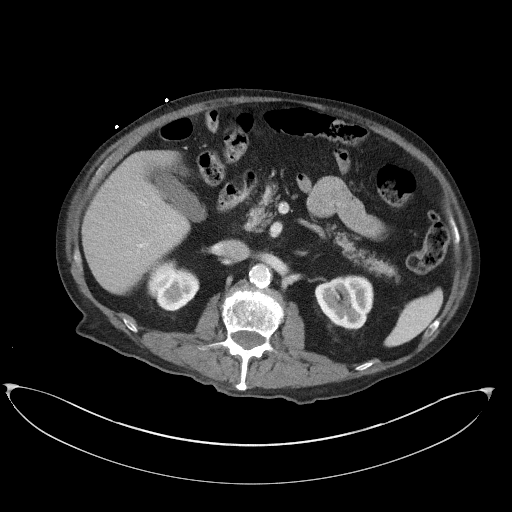
[im 74/88  soft-tissue]
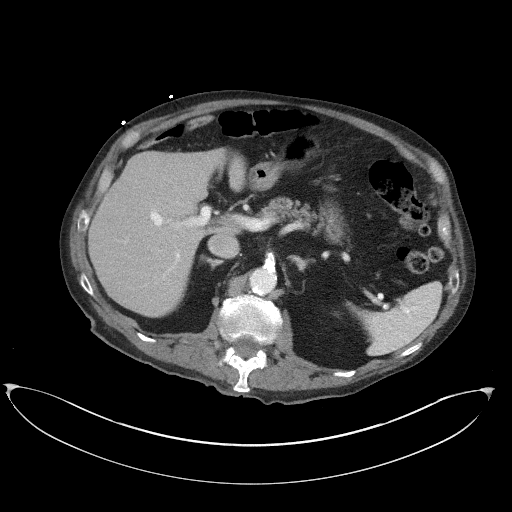
[im 83/88  soft-tissue]
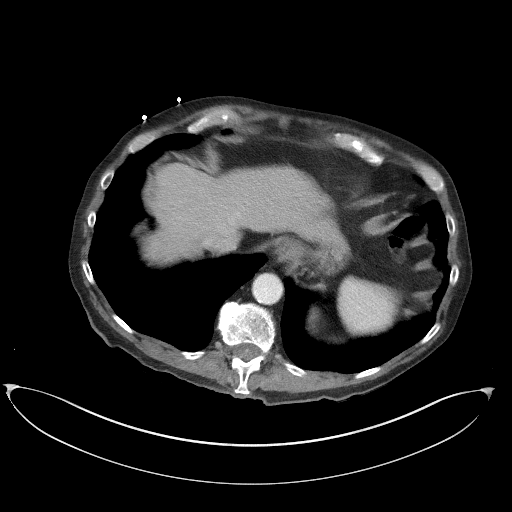

[Series 5: coronal st · coronal · 0.76mm/px · 3 of 108 slices shown]
[im 36/108  soft-tissue]
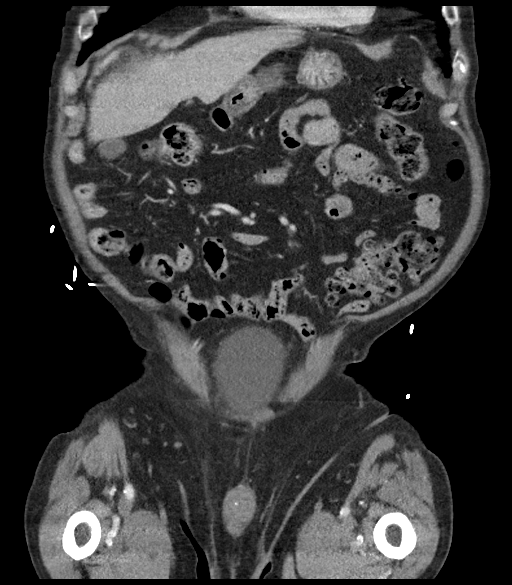
[im 48/108  soft-tissue]
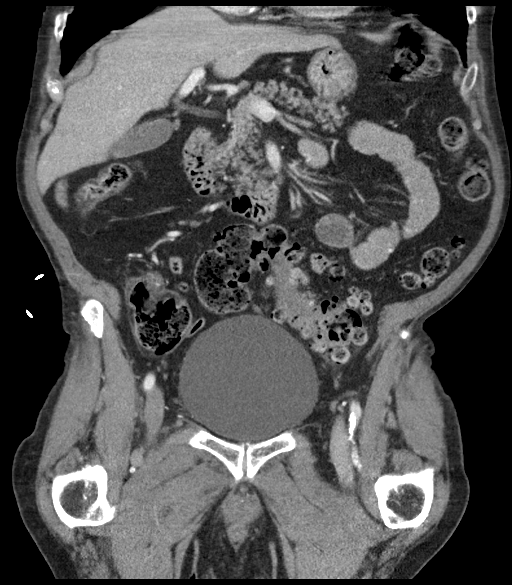
[im 60/108  soft-tissue]
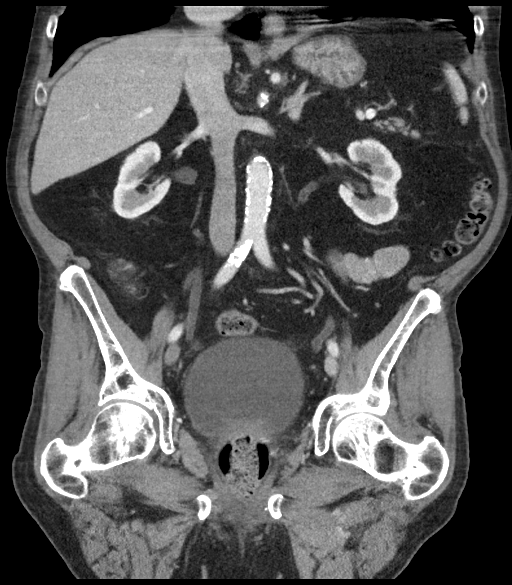

[16 of 46 positions shown; findings below may reference images not displayed]

FINDINGS: Lower chest: Stable, negative.

Hepatobiliary: Stable, negative.

Pancreas: Stable pancreatic atrophy.

Spleen: Negative.

Adrenals/Urinary Tract: Normal adrenal glands.

Mild bilateral hydronephrosis and hydroureter today with distended
urinary bladder (535 mL) with mass effect from fecal impaction on
the base of the bladder (sagittal image 63). There is symmetric
renal contrast excretion on delayed images. No urinary calculus
identified.

Stomach/Bowel: Stool ball in the rectum up to 12 cm (sagittal image
63), new from prior. Redundant sigmoid colon with less retained
stool, extensive diverticulosis from the splenic flexure to the
sigmoid. No active inflammation today.

Redundant transverse colon with occasional diverticula. Mostly
decompressed right colon also with occasional diverticula. Normal
appendix on coronal image 52.

There are diverticula also of the terminal ileum, no active
inflammation. No dilated small bowel. Decompressed stomach and
duodenum. No free air, free fluid.

Vascular/Lymphatic: Aortoiliac calcified atherosclerosis. Major
arterial structures remain patent. Portal venous system is patent.
No lymphadenopathy.

Reproductive: Negative.

Other: No pelvic free fluid.

Musculoskeletal: Stable. Osteopenia. No acute osseous abnormality
identified.
IMPRESSION: 1. Fecal impaction, with secondary obstruction of the bladder
outlet. 12 cm stool ball in the rectum. 535 mL bladder with mild
bilateral hydronephrosis and hydroureter.
2. Extensive large bowel diverticulosis, and occasional small bowel
diverticula, but no active inflammation identified.
3. Aortic Atherosclerosis ([id number]).

## 2022-12-06 DIAGNOSIS — W19XXXA Unspecified fall, initial encounter: Secondary | ICD-10-CM | POA: Diagnosis not present

## 2022-12-06 DIAGNOSIS — R2681 Unsteadiness on feet: Secondary | ICD-10-CM | POA: Diagnosis not present

## 2022-12-09 DIAGNOSIS — I4891 Unspecified atrial fibrillation: Secondary | ICD-10-CM | POA: Diagnosis not present

## 2022-12-09 DIAGNOSIS — D6869 Other thrombophilia: Secondary | ICD-10-CM | POA: Diagnosis not present

## 2022-12-09 DIAGNOSIS — F331 Major depressive disorder, recurrent, moderate: Secondary | ICD-10-CM | POA: Diagnosis not present

## 2022-12-09 DIAGNOSIS — I110 Hypertensive heart disease with heart failure: Secondary | ICD-10-CM | POA: Diagnosis not present

## 2022-12-09 DIAGNOSIS — I25118 Atherosclerotic heart disease of native coronary artery with other forms of angina pectoris: Secondary | ICD-10-CM | POA: Diagnosis not present

## 2022-12-09 DIAGNOSIS — D692 Other nonthrombocytopenic purpura: Secondary | ICD-10-CM | POA: Diagnosis not present

## 2022-12-09 DIAGNOSIS — D239 Other benign neoplasm of skin, unspecified: Secondary | ICD-10-CM | POA: Diagnosis not present

## 2022-12-09 DIAGNOSIS — I509 Heart failure, unspecified: Secondary | ICD-10-CM | POA: Diagnosis not present

## 2022-12-09 DIAGNOSIS — F419 Anxiety disorder, unspecified: Secondary | ICD-10-CM | POA: Diagnosis not present

## 2022-12-13 DIAGNOSIS — I25118 Atherosclerotic heart disease of native coronary artery with other forms of angina pectoris: Secondary | ICD-10-CM | POA: Diagnosis not present

## 2022-12-13 DIAGNOSIS — I4891 Unspecified atrial fibrillation: Secondary | ICD-10-CM | POA: Diagnosis not present

## 2022-12-13 DIAGNOSIS — F419 Anxiety disorder, unspecified: Secondary | ICD-10-CM | POA: Diagnosis not present

## 2022-12-13 DIAGNOSIS — I509 Heart failure, unspecified: Secondary | ICD-10-CM | POA: Diagnosis not present

## 2022-12-13 DIAGNOSIS — D6869 Other thrombophilia: Secondary | ICD-10-CM | POA: Diagnosis not present

## 2022-12-13 DIAGNOSIS — D692 Other nonthrombocytopenic purpura: Secondary | ICD-10-CM | POA: Diagnosis not present

## 2022-12-13 DIAGNOSIS — F331 Major depressive disorder, recurrent, moderate: Secondary | ICD-10-CM | POA: Diagnosis not present

## 2022-12-13 DIAGNOSIS — D239 Other benign neoplasm of skin, unspecified: Secondary | ICD-10-CM | POA: Diagnosis not present

## 2022-12-13 DIAGNOSIS — I110 Hypertensive heart disease with heart failure: Secondary | ICD-10-CM | POA: Diagnosis not present

## 2022-12-17 DIAGNOSIS — D6869 Other thrombophilia: Secondary | ICD-10-CM | POA: Diagnosis not present

## 2022-12-17 DIAGNOSIS — I25118 Atherosclerotic heart disease of native coronary artery with other forms of angina pectoris: Secondary | ICD-10-CM | POA: Diagnosis not present

## 2022-12-17 DIAGNOSIS — I110 Hypertensive heart disease with heart failure: Secondary | ICD-10-CM | POA: Diagnosis not present

## 2022-12-17 DIAGNOSIS — F331 Major depressive disorder, recurrent, moderate: Secondary | ICD-10-CM | POA: Diagnosis not present

## 2022-12-17 DIAGNOSIS — D692 Other nonthrombocytopenic purpura: Secondary | ICD-10-CM | POA: Diagnosis not present

## 2022-12-17 DIAGNOSIS — I4891 Unspecified atrial fibrillation: Secondary | ICD-10-CM | POA: Diagnosis not present

## 2022-12-17 DIAGNOSIS — I509 Heart failure, unspecified: Secondary | ICD-10-CM | POA: Diagnosis not present

## 2022-12-17 DIAGNOSIS — F419 Anxiety disorder, unspecified: Secondary | ICD-10-CM | POA: Diagnosis not present

## 2022-12-17 DIAGNOSIS — D239 Other benign neoplasm of skin, unspecified: Secondary | ICD-10-CM | POA: Diagnosis not present

## 2022-12-20 DIAGNOSIS — D239 Other benign neoplasm of skin, unspecified: Secondary | ICD-10-CM | POA: Diagnosis not present

## 2022-12-20 DIAGNOSIS — D6869 Other thrombophilia: Secondary | ICD-10-CM | POA: Diagnosis not present

## 2022-12-20 DIAGNOSIS — I25118 Atherosclerotic heart disease of native coronary artery with other forms of angina pectoris: Secondary | ICD-10-CM | POA: Diagnosis not present

## 2022-12-20 DIAGNOSIS — I110 Hypertensive heart disease with heart failure: Secondary | ICD-10-CM | POA: Diagnosis not present

## 2022-12-20 DIAGNOSIS — D692 Other nonthrombocytopenic purpura: Secondary | ICD-10-CM | POA: Diagnosis not present

## 2022-12-20 DIAGNOSIS — F331 Major depressive disorder, recurrent, moderate: Secondary | ICD-10-CM | POA: Diagnosis not present

## 2022-12-20 DIAGNOSIS — F419 Anxiety disorder, unspecified: Secondary | ICD-10-CM | POA: Diagnosis not present

## 2022-12-20 DIAGNOSIS — I4891 Unspecified atrial fibrillation: Secondary | ICD-10-CM | POA: Diagnosis not present

## 2022-12-20 DIAGNOSIS — I509 Heart failure, unspecified: Secondary | ICD-10-CM | POA: Diagnosis not present

## 2022-12-21 DIAGNOSIS — F419 Anxiety disorder, unspecified: Secondary | ICD-10-CM | POA: Diagnosis not present

## 2022-12-21 DIAGNOSIS — I509 Heart failure, unspecified: Secondary | ICD-10-CM | POA: Diagnosis not present

## 2022-12-21 DIAGNOSIS — F331 Major depressive disorder, recurrent, moderate: Secondary | ICD-10-CM | POA: Diagnosis not present

## 2022-12-21 DIAGNOSIS — I4891 Unspecified atrial fibrillation: Secondary | ICD-10-CM | POA: Diagnosis not present

## 2022-12-21 DIAGNOSIS — I25118 Atherosclerotic heart disease of native coronary artery with other forms of angina pectoris: Secondary | ICD-10-CM | POA: Diagnosis not present

## 2022-12-21 DIAGNOSIS — D239 Other benign neoplasm of skin, unspecified: Secondary | ICD-10-CM | POA: Diagnosis not present

## 2022-12-21 DIAGNOSIS — D692 Other nonthrombocytopenic purpura: Secondary | ICD-10-CM | POA: Diagnosis not present

## 2022-12-21 DIAGNOSIS — I110 Hypertensive heart disease with heart failure: Secondary | ICD-10-CM | POA: Diagnosis not present

## 2022-12-21 DIAGNOSIS — D6869 Other thrombophilia: Secondary | ICD-10-CM | POA: Diagnosis not present

## 2022-12-24 DIAGNOSIS — I509 Heart failure, unspecified: Secondary | ICD-10-CM | POA: Diagnosis not present

## 2022-12-24 DIAGNOSIS — J4489 Other specified chronic obstructive pulmonary disease: Secondary | ICD-10-CM | POA: Diagnosis not present

## 2022-12-24 DIAGNOSIS — I25118 Atherosclerotic heart disease of native coronary artery with other forms of angina pectoris: Secondary | ICD-10-CM | POA: Diagnosis not present

## 2022-12-24 DIAGNOSIS — N289 Disorder of kidney and ureter, unspecified: Secondary | ICD-10-CM | POA: Diagnosis not present

## 2022-12-24 DIAGNOSIS — F331 Major depressive disorder, recurrent, moderate: Secondary | ICD-10-CM | POA: Diagnosis not present

## 2022-12-24 DIAGNOSIS — D239 Other benign neoplasm of skin, unspecified: Secondary | ICD-10-CM | POA: Diagnosis not present

## 2022-12-24 DIAGNOSIS — F419 Anxiety disorder, unspecified: Secondary | ICD-10-CM | POA: Diagnosis not present

## 2022-12-24 DIAGNOSIS — I4891 Unspecified atrial fibrillation: Secondary | ICD-10-CM | POA: Diagnosis not present

## 2022-12-24 DIAGNOSIS — I110 Hypertensive heart disease with heart failure: Secondary | ICD-10-CM | POA: Diagnosis not present

## 2022-12-27 DIAGNOSIS — I25118 Atherosclerotic heart disease of native coronary artery with other forms of angina pectoris: Secondary | ICD-10-CM | POA: Diagnosis not present

## 2022-12-27 DIAGNOSIS — D239 Other benign neoplasm of skin, unspecified: Secondary | ICD-10-CM | POA: Diagnosis not present

## 2022-12-27 DIAGNOSIS — F419 Anxiety disorder, unspecified: Secondary | ICD-10-CM | POA: Diagnosis not present

## 2022-12-27 DIAGNOSIS — I509 Heart failure, unspecified: Secondary | ICD-10-CM | POA: Diagnosis not present

## 2022-12-27 DIAGNOSIS — J4489 Other specified chronic obstructive pulmonary disease: Secondary | ICD-10-CM | POA: Diagnosis not present

## 2022-12-27 DIAGNOSIS — I4891 Unspecified atrial fibrillation: Secondary | ICD-10-CM | POA: Diagnosis not present

## 2022-12-27 DIAGNOSIS — F331 Major depressive disorder, recurrent, moderate: Secondary | ICD-10-CM | POA: Diagnosis not present

## 2022-12-27 DIAGNOSIS — N289 Disorder of kidney and ureter, unspecified: Secondary | ICD-10-CM | POA: Diagnosis not present

## 2022-12-27 DIAGNOSIS — I110 Hypertensive heart disease with heart failure: Secondary | ICD-10-CM | POA: Diagnosis not present

## 2022-12-28 DIAGNOSIS — I509 Heart failure, unspecified: Secondary | ICD-10-CM | POA: Diagnosis not present

## 2022-12-28 DIAGNOSIS — N289 Disorder of kidney and ureter, unspecified: Secondary | ICD-10-CM | POA: Diagnosis not present

## 2022-12-28 DIAGNOSIS — J4489 Other specified chronic obstructive pulmonary disease: Secondary | ICD-10-CM | POA: Diagnosis not present

## 2022-12-28 DIAGNOSIS — I110 Hypertensive heart disease with heart failure: Secondary | ICD-10-CM | POA: Diagnosis not present

## 2022-12-28 DIAGNOSIS — D239 Other benign neoplasm of skin, unspecified: Secondary | ICD-10-CM | POA: Diagnosis not present

## 2022-12-28 DIAGNOSIS — F331 Major depressive disorder, recurrent, moderate: Secondary | ICD-10-CM | POA: Diagnosis not present

## 2022-12-28 DIAGNOSIS — F419 Anxiety disorder, unspecified: Secondary | ICD-10-CM | POA: Diagnosis not present

## 2022-12-28 DIAGNOSIS — I25118 Atherosclerotic heart disease of native coronary artery with other forms of angina pectoris: Secondary | ICD-10-CM | POA: Diagnosis not present

## 2022-12-28 DIAGNOSIS — I4891 Unspecified atrial fibrillation: Secondary | ICD-10-CM | POA: Diagnosis not present

## 2023-01-01 DIAGNOSIS — J4489 Other specified chronic obstructive pulmonary disease: Secondary | ICD-10-CM | POA: Diagnosis not present

## 2023-01-01 DIAGNOSIS — I4891 Unspecified atrial fibrillation: Secondary | ICD-10-CM | POA: Diagnosis not present

## 2023-01-01 DIAGNOSIS — N289 Disorder of kidney and ureter, unspecified: Secondary | ICD-10-CM | POA: Diagnosis not present

## 2023-01-01 DIAGNOSIS — I509 Heart failure, unspecified: Secondary | ICD-10-CM | POA: Diagnosis not present

## 2023-01-01 DIAGNOSIS — I110 Hypertensive heart disease with heart failure: Secondary | ICD-10-CM | POA: Diagnosis not present

## 2023-01-01 DIAGNOSIS — D239 Other benign neoplasm of skin, unspecified: Secondary | ICD-10-CM | POA: Diagnosis not present

## 2023-01-01 DIAGNOSIS — I25118 Atherosclerotic heart disease of native coronary artery with other forms of angina pectoris: Secondary | ICD-10-CM | POA: Diagnosis not present

## 2023-01-01 DIAGNOSIS — F331 Major depressive disorder, recurrent, moderate: Secondary | ICD-10-CM | POA: Diagnosis not present

## 2023-01-01 DIAGNOSIS — F419 Anxiety disorder, unspecified: Secondary | ICD-10-CM | POA: Diagnosis not present

## 2023-01-03 DIAGNOSIS — J4489 Other specified chronic obstructive pulmonary disease: Secondary | ICD-10-CM | POA: Diagnosis not present

## 2023-01-03 DIAGNOSIS — I110 Hypertensive heart disease with heart failure: Secondary | ICD-10-CM | POA: Diagnosis not present

## 2023-01-03 DIAGNOSIS — I509 Heart failure, unspecified: Secondary | ICD-10-CM | POA: Diagnosis not present

## 2023-01-03 DIAGNOSIS — D239 Other benign neoplasm of skin, unspecified: Secondary | ICD-10-CM | POA: Diagnosis not present

## 2023-01-03 DIAGNOSIS — F331 Major depressive disorder, recurrent, moderate: Secondary | ICD-10-CM | POA: Diagnosis not present

## 2023-01-03 DIAGNOSIS — N289 Disorder of kidney and ureter, unspecified: Secondary | ICD-10-CM | POA: Diagnosis not present

## 2023-01-03 DIAGNOSIS — I4891 Unspecified atrial fibrillation: Secondary | ICD-10-CM | POA: Diagnosis not present

## 2023-01-03 DIAGNOSIS — I25118 Atherosclerotic heart disease of native coronary artery with other forms of angina pectoris: Secondary | ICD-10-CM | POA: Diagnosis not present

## 2023-01-03 DIAGNOSIS — F419 Anxiety disorder, unspecified: Secondary | ICD-10-CM | POA: Diagnosis not present

## 2023-01-04 DIAGNOSIS — F331 Major depressive disorder, recurrent, moderate: Secondary | ICD-10-CM | POA: Diagnosis not present

## 2023-01-04 DIAGNOSIS — I4891 Unspecified atrial fibrillation: Secondary | ICD-10-CM | POA: Diagnosis not present

## 2023-01-04 DIAGNOSIS — I110 Hypertensive heart disease with heart failure: Secondary | ICD-10-CM | POA: Diagnosis not present

## 2023-01-04 DIAGNOSIS — I509 Heart failure, unspecified: Secondary | ICD-10-CM | POA: Diagnosis not present

## 2023-01-07 DIAGNOSIS — F331 Major depressive disorder, recurrent, moderate: Secondary | ICD-10-CM | POA: Diagnosis not present

## 2023-01-07 DIAGNOSIS — I25118 Atherosclerotic heart disease of native coronary artery with other forms of angina pectoris: Secondary | ICD-10-CM | POA: Diagnosis not present

## 2023-01-07 DIAGNOSIS — I509 Heart failure, unspecified: Secondary | ICD-10-CM | POA: Diagnosis not present

## 2023-01-07 DIAGNOSIS — F419 Anxiety disorder, unspecified: Secondary | ICD-10-CM | POA: Diagnosis not present

## 2023-01-07 DIAGNOSIS — D239 Other benign neoplasm of skin, unspecified: Secondary | ICD-10-CM | POA: Diagnosis not present

## 2023-01-07 DIAGNOSIS — I110 Hypertensive heart disease with heart failure: Secondary | ICD-10-CM | POA: Diagnosis not present

## 2023-01-07 DIAGNOSIS — I4891 Unspecified atrial fibrillation: Secondary | ICD-10-CM | POA: Diagnosis not present

## 2023-01-07 DIAGNOSIS — N289 Disorder of kidney and ureter, unspecified: Secondary | ICD-10-CM | POA: Diagnosis not present

## 2023-01-07 DIAGNOSIS — J4489 Other specified chronic obstructive pulmonary disease: Secondary | ICD-10-CM | POA: Diagnosis not present

## 2023-01-08 DIAGNOSIS — I25118 Atherosclerotic heart disease of native coronary artery with other forms of angina pectoris: Secondary | ICD-10-CM | POA: Diagnosis not present

## 2023-01-08 DIAGNOSIS — I110 Hypertensive heart disease with heart failure: Secondary | ICD-10-CM | POA: Diagnosis not present

## 2023-01-08 DIAGNOSIS — I509 Heart failure, unspecified: Secondary | ICD-10-CM | POA: Diagnosis not present

## 2023-01-08 DIAGNOSIS — F331 Major depressive disorder, recurrent, moderate: Secondary | ICD-10-CM | POA: Diagnosis not present

## 2023-01-08 DIAGNOSIS — N289 Disorder of kidney and ureter, unspecified: Secondary | ICD-10-CM | POA: Diagnosis not present

## 2023-01-08 DIAGNOSIS — J4489 Other specified chronic obstructive pulmonary disease: Secondary | ICD-10-CM | POA: Diagnosis not present

## 2023-01-08 DIAGNOSIS — I4891 Unspecified atrial fibrillation: Secondary | ICD-10-CM | POA: Diagnosis not present

## 2023-01-08 DIAGNOSIS — F419 Anxiety disorder, unspecified: Secondary | ICD-10-CM | POA: Diagnosis not present

## 2023-01-08 DIAGNOSIS — D239 Other benign neoplasm of skin, unspecified: Secondary | ICD-10-CM | POA: Diagnosis not present

## 2023-01-10 DIAGNOSIS — J4489 Other specified chronic obstructive pulmonary disease: Secondary | ICD-10-CM | POA: Diagnosis not present

## 2023-01-10 DIAGNOSIS — F419 Anxiety disorder, unspecified: Secondary | ICD-10-CM | POA: Diagnosis not present

## 2023-01-10 DIAGNOSIS — N289 Disorder of kidney and ureter, unspecified: Secondary | ICD-10-CM | POA: Diagnosis not present

## 2023-01-10 DIAGNOSIS — I25118 Atherosclerotic heart disease of native coronary artery with other forms of angina pectoris: Secondary | ICD-10-CM | POA: Diagnosis not present

## 2023-01-10 DIAGNOSIS — I4891 Unspecified atrial fibrillation: Secondary | ICD-10-CM | POA: Diagnosis not present

## 2023-01-10 DIAGNOSIS — I110 Hypertensive heart disease with heart failure: Secondary | ICD-10-CM | POA: Diagnosis not present

## 2023-01-10 DIAGNOSIS — D239 Other benign neoplasm of skin, unspecified: Secondary | ICD-10-CM | POA: Diagnosis not present

## 2023-01-10 DIAGNOSIS — I509 Heart failure, unspecified: Secondary | ICD-10-CM | POA: Diagnosis not present

## 2023-01-10 DIAGNOSIS — F331 Major depressive disorder, recurrent, moderate: Secondary | ICD-10-CM | POA: Diagnosis not present

## 2023-01-14 DIAGNOSIS — I509 Heart failure, unspecified: Secondary | ICD-10-CM | POA: Diagnosis not present

## 2023-01-14 DIAGNOSIS — J4489 Other specified chronic obstructive pulmonary disease: Secondary | ICD-10-CM | POA: Diagnosis not present

## 2023-01-14 DIAGNOSIS — N289 Disorder of kidney and ureter, unspecified: Secondary | ICD-10-CM | POA: Diagnosis not present

## 2023-01-14 DIAGNOSIS — I110 Hypertensive heart disease with heart failure: Secondary | ICD-10-CM | POA: Diagnosis not present

## 2023-01-14 DIAGNOSIS — F419 Anxiety disorder, unspecified: Secondary | ICD-10-CM | POA: Diagnosis not present

## 2023-01-14 DIAGNOSIS — I4891 Unspecified atrial fibrillation: Secondary | ICD-10-CM | POA: Diagnosis not present

## 2023-01-14 DIAGNOSIS — F331 Major depressive disorder, recurrent, moderate: Secondary | ICD-10-CM | POA: Diagnosis not present

## 2023-01-14 DIAGNOSIS — I25118 Atherosclerotic heart disease of native coronary artery with other forms of angina pectoris: Secondary | ICD-10-CM | POA: Diagnosis not present

## 2023-01-14 DIAGNOSIS — D239 Other benign neoplasm of skin, unspecified: Secondary | ICD-10-CM | POA: Diagnosis not present

## 2023-01-15 DIAGNOSIS — F419 Anxiety disorder, unspecified: Secondary | ICD-10-CM | POA: Diagnosis not present

## 2023-01-15 DIAGNOSIS — F331 Major depressive disorder, recurrent, moderate: Secondary | ICD-10-CM | POA: Diagnosis not present

## 2023-01-15 DIAGNOSIS — N289 Disorder of kidney and ureter, unspecified: Secondary | ICD-10-CM | POA: Diagnosis not present

## 2023-01-15 DIAGNOSIS — I110 Hypertensive heart disease with heart failure: Secondary | ICD-10-CM | POA: Diagnosis not present

## 2023-01-15 DIAGNOSIS — D239 Other benign neoplasm of skin, unspecified: Secondary | ICD-10-CM | POA: Diagnosis not present

## 2023-01-15 DIAGNOSIS — I25118 Atherosclerotic heart disease of native coronary artery with other forms of angina pectoris: Secondary | ICD-10-CM | POA: Diagnosis not present

## 2023-01-15 DIAGNOSIS — J4489 Other specified chronic obstructive pulmonary disease: Secondary | ICD-10-CM | POA: Diagnosis not present

## 2023-01-15 DIAGNOSIS — I4891 Unspecified atrial fibrillation: Secondary | ICD-10-CM | POA: Diagnosis not present

## 2023-01-15 DIAGNOSIS — I509 Heart failure, unspecified: Secondary | ICD-10-CM | POA: Diagnosis not present

## 2023-01-16 DIAGNOSIS — I25118 Atherosclerotic heart disease of native coronary artery with other forms of angina pectoris: Secondary | ICD-10-CM | POA: Diagnosis not present

## 2023-01-16 DIAGNOSIS — N289 Disorder of kidney and ureter, unspecified: Secondary | ICD-10-CM | POA: Diagnosis not present

## 2023-01-16 DIAGNOSIS — F331 Major depressive disorder, recurrent, moderate: Secondary | ICD-10-CM | POA: Diagnosis not present

## 2023-01-16 DIAGNOSIS — I110 Hypertensive heart disease with heart failure: Secondary | ICD-10-CM | POA: Diagnosis not present

## 2023-01-16 DIAGNOSIS — F419 Anxiety disorder, unspecified: Secondary | ICD-10-CM | POA: Diagnosis not present

## 2023-01-16 DIAGNOSIS — I509 Heart failure, unspecified: Secondary | ICD-10-CM | POA: Diagnosis not present

## 2023-01-16 DIAGNOSIS — J4489 Other specified chronic obstructive pulmonary disease: Secondary | ICD-10-CM | POA: Diagnosis not present

## 2023-01-16 DIAGNOSIS — I4891 Unspecified atrial fibrillation: Secondary | ICD-10-CM | POA: Diagnosis not present

## 2023-01-16 DIAGNOSIS — D239 Other benign neoplasm of skin, unspecified: Secondary | ICD-10-CM | POA: Diagnosis not present

## 2023-01-18 DIAGNOSIS — R42 Dizziness and giddiness: Secondary | ICD-10-CM | POA: Diagnosis not present

## 2023-01-18 DIAGNOSIS — D239 Other benign neoplasm of skin, unspecified: Secondary | ICD-10-CM | POA: Diagnosis not present

## 2023-01-18 DIAGNOSIS — J4489 Other specified chronic obstructive pulmonary disease: Secondary | ICD-10-CM | POA: Diagnosis not present

## 2023-01-18 DIAGNOSIS — I10 Essential (primary) hypertension: Secondary | ICD-10-CM | POA: Diagnosis not present

## 2023-01-18 DIAGNOSIS — I509 Heart failure, unspecified: Secondary | ICD-10-CM | POA: Diagnosis not present

## 2023-01-18 DIAGNOSIS — N289 Disorder of kidney and ureter, unspecified: Secondary | ICD-10-CM | POA: Diagnosis not present

## 2023-01-18 DIAGNOSIS — R296 Repeated falls: Secondary | ICD-10-CM | POA: Diagnosis not present

## 2023-01-18 DIAGNOSIS — R634 Abnormal weight loss: Secondary | ICD-10-CM | POA: Diagnosis not present

## 2023-01-18 DIAGNOSIS — F419 Anxiety disorder, unspecified: Secondary | ICD-10-CM | POA: Diagnosis not present

## 2023-01-18 DIAGNOSIS — I110 Hypertensive heart disease with heart failure: Secondary | ICD-10-CM | POA: Diagnosis not present

## 2023-01-18 DIAGNOSIS — I25118 Atherosclerotic heart disease of native coronary artery with other forms of angina pectoris: Secondary | ICD-10-CM | POA: Diagnosis not present

## 2023-01-18 DIAGNOSIS — M6281 Muscle weakness (generalized): Secondary | ICD-10-CM | POA: Diagnosis not present

## 2023-01-18 DIAGNOSIS — I5022 Chronic systolic (congestive) heart failure: Secondary | ICD-10-CM | POA: Diagnosis not present

## 2023-01-18 DIAGNOSIS — R64 Cachexia: Secondary | ICD-10-CM | POA: Diagnosis not present

## 2023-01-18 DIAGNOSIS — F331 Major depressive disorder, recurrent, moderate: Secondary | ICD-10-CM | POA: Diagnosis not present

## 2023-01-18 DIAGNOSIS — I4891 Unspecified atrial fibrillation: Secondary | ICD-10-CM | POA: Diagnosis not present

## 2023-01-20 ENCOUNTER — Other Ambulatory Visit: Payer: Self-pay | Admitting: Cardiology

## 2023-01-21 DIAGNOSIS — N289 Disorder of kidney and ureter, unspecified: Secondary | ICD-10-CM | POA: Diagnosis not present

## 2023-01-21 DIAGNOSIS — F419 Anxiety disorder, unspecified: Secondary | ICD-10-CM | POA: Diagnosis not present

## 2023-01-21 DIAGNOSIS — I110 Hypertensive heart disease with heart failure: Secondary | ICD-10-CM | POA: Diagnosis not present

## 2023-01-21 DIAGNOSIS — D239 Other benign neoplasm of skin, unspecified: Secondary | ICD-10-CM | POA: Diagnosis not present

## 2023-01-21 DIAGNOSIS — I509 Heart failure, unspecified: Secondary | ICD-10-CM | POA: Diagnosis not present

## 2023-01-21 DIAGNOSIS — J4489 Other specified chronic obstructive pulmonary disease: Secondary | ICD-10-CM | POA: Diagnosis not present

## 2023-01-21 DIAGNOSIS — F331 Major depressive disorder, recurrent, moderate: Secondary | ICD-10-CM | POA: Diagnosis not present

## 2023-01-21 DIAGNOSIS — I4891 Unspecified atrial fibrillation: Secondary | ICD-10-CM | POA: Diagnosis not present

## 2023-01-21 DIAGNOSIS — I25118 Atherosclerotic heart disease of native coronary artery with other forms of angina pectoris: Secondary | ICD-10-CM | POA: Diagnosis not present

## 2023-01-23 DIAGNOSIS — F331 Major depressive disorder, recurrent, moderate: Secondary | ICD-10-CM | POA: Diagnosis not present

## 2023-01-23 DIAGNOSIS — I110 Hypertensive heart disease with heart failure: Secondary | ICD-10-CM | POA: Diagnosis not present

## 2023-01-23 DIAGNOSIS — I509 Heart failure, unspecified: Secondary | ICD-10-CM | POA: Diagnosis not present

## 2023-01-23 DIAGNOSIS — F419 Anxiety disorder, unspecified: Secondary | ICD-10-CM | POA: Diagnosis not present

## 2023-01-23 DIAGNOSIS — J4489 Other specified chronic obstructive pulmonary disease: Secondary | ICD-10-CM | POA: Diagnosis not present

## 2023-01-23 DIAGNOSIS — I25118 Atherosclerotic heart disease of native coronary artery with other forms of angina pectoris: Secondary | ICD-10-CM | POA: Diagnosis not present

## 2023-01-23 DIAGNOSIS — D239 Other benign neoplasm of skin, unspecified: Secondary | ICD-10-CM | POA: Diagnosis not present

## 2023-01-23 DIAGNOSIS — N289 Disorder of kidney and ureter, unspecified: Secondary | ICD-10-CM | POA: Diagnosis not present

## 2023-01-23 DIAGNOSIS — I4891 Unspecified atrial fibrillation: Secondary | ICD-10-CM | POA: Diagnosis not present

## 2023-01-29 DIAGNOSIS — N289 Disorder of kidney and ureter, unspecified: Secondary | ICD-10-CM | POA: Diagnosis not present

## 2023-01-29 DIAGNOSIS — I110 Hypertensive heart disease with heart failure: Secondary | ICD-10-CM | POA: Diagnosis not present

## 2023-01-29 DIAGNOSIS — I25118 Atherosclerotic heart disease of native coronary artery with other forms of angina pectoris: Secondary | ICD-10-CM | POA: Diagnosis not present

## 2023-01-29 DIAGNOSIS — I4891 Unspecified atrial fibrillation: Secondary | ICD-10-CM | POA: Diagnosis not present

## 2023-01-29 DIAGNOSIS — J4489 Other specified chronic obstructive pulmonary disease: Secondary | ICD-10-CM | POA: Diagnosis not present

## 2023-01-29 DIAGNOSIS — F331 Major depressive disorder, recurrent, moderate: Secondary | ICD-10-CM | POA: Diagnosis not present

## 2023-01-29 DIAGNOSIS — D239 Other benign neoplasm of skin, unspecified: Secondary | ICD-10-CM | POA: Diagnosis not present

## 2023-01-29 DIAGNOSIS — F419 Anxiety disorder, unspecified: Secondary | ICD-10-CM | POA: Diagnosis not present

## 2023-01-29 DIAGNOSIS — I509 Heart failure, unspecified: Secondary | ICD-10-CM | POA: Diagnosis not present

## 2023-01-30 DIAGNOSIS — I25118 Atherosclerotic heart disease of native coronary artery with other forms of angina pectoris: Secondary | ICD-10-CM | POA: Diagnosis not present

## 2023-01-30 DIAGNOSIS — I350 Nonrheumatic aortic (valve) stenosis: Secondary | ICD-10-CM | POA: Diagnosis not present

## 2023-01-30 DIAGNOSIS — I10 Essential (primary) hypertension: Secondary | ICD-10-CM | POA: Diagnosis not present

## 2023-01-30 DIAGNOSIS — I4891 Unspecified atrial fibrillation: Secondary | ICD-10-CM | POA: Diagnosis not present

## 2023-01-30 DIAGNOSIS — J449 Chronic obstructive pulmonary disease, unspecified: Secondary | ICD-10-CM | POA: Diagnosis not present

## 2023-01-30 DIAGNOSIS — R52 Pain, unspecified: Secondary | ICD-10-CM | POA: Diagnosis not present

## 2023-01-30 DIAGNOSIS — Z299 Encounter for prophylactic measures, unspecified: Secondary | ICD-10-CM | POA: Diagnosis not present

## 2023-02-01 DIAGNOSIS — I509 Heart failure, unspecified: Secondary | ICD-10-CM | POA: Diagnosis not present

## 2023-02-01 DIAGNOSIS — I4891 Unspecified atrial fibrillation: Secondary | ICD-10-CM | POA: Diagnosis not present

## 2023-02-01 DIAGNOSIS — J4489 Other specified chronic obstructive pulmonary disease: Secondary | ICD-10-CM | POA: Diagnosis not present

## 2023-02-01 DIAGNOSIS — I110 Hypertensive heart disease with heart failure: Secondary | ICD-10-CM | POA: Diagnosis not present

## 2023-02-01 DIAGNOSIS — F419 Anxiety disorder, unspecified: Secondary | ICD-10-CM | POA: Diagnosis not present

## 2023-02-01 DIAGNOSIS — I25118 Atherosclerotic heart disease of native coronary artery with other forms of angina pectoris: Secondary | ICD-10-CM | POA: Diagnosis not present

## 2023-02-01 DIAGNOSIS — F331 Major depressive disorder, recurrent, moderate: Secondary | ICD-10-CM | POA: Diagnosis not present

## 2023-02-01 DIAGNOSIS — D239 Other benign neoplasm of skin, unspecified: Secondary | ICD-10-CM | POA: Diagnosis not present

## 2023-02-01 DIAGNOSIS — N289 Disorder of kidney and ureter, unspecified: Secondary | ICD-10-CM | POA: Diagnosis not present

## 2023-02-06 DIAGNOSIS — I25118 Atherosclerotic heart disease of native coronary artery with other forms of angina pectoris: Secondary | ICD-10-CM | POA: Diagnosis not present

## 2023-02-06 DIAGNOSIS — I4891 Unspecified atrial fibrillation: Secondary | ICD-10-CM | POA: Diagnosis not present

## 2023-02-06 DIAGNOSIS — I110 Hypertensive heart disease with heart failure: Secondary | ICD-10-CM | POA: Diagnosis not present

## 2023-02-06 DIAGNOSIS — F419 Anxiety disorder, unspecified: Secondary | ICD-10-CM | POA: Diagnosis not present

## 2023-02-06 DIAGNOSIS — F331 Major depressive disorder, recurrent, moderate: Secondary | ICD-10-CM | POA: Diagnosis not present

## 2023-02-06 DIAGNOSIS — J4489 Other specified chronic obstructive pulmonary disease: Secondary | ICD-10-CM | POA: Diagnosis not present

## 2023-02-06 DIAGNOSIS — I509 Heart failure, unspecified: Secondary | ICD-10-CM | POA: Diagnosis not present

## 2023-02-06 DIAGNOSIS — N289 Disorder of kidney and ureter, unspecified: Secondary | ICD-10-CM | POA: Diagnosis not present

## 2023-02-06 DIAGNOSIS — D239 Other benign neoplasm of skin, unspecified: Secondary | ICD-10-CM | POA: Diagnosis not present

## 2023-02-13 DIAGNOSIS — N289 Disorder of kidney and ureter, unspecified: Secondary | ICD-10-CM | POA: Diagnosis not present

## 2023-02-13 DIAGNOSIS — F331 Major depressive disorder, recurrent, moderate: Secondary | ICD-10-CM | POA: Diagnosis not present

## 2023-02-13 DIAGNOSIS — I4891 Unspecified atrial fibrillation: Secondary | ICD-10-CM | POA: Diagnosis not present

## 2023-02-13 DIAGNOSIS — I25118 Atherosclerotic heart disease of native coronary artery with other forms of angina pectoris: Secondary | ICD-10-CM | POA: Diagnosis not present

## 2023-02-13 DIAGNOSIS — F419 Anxiety disorder, unspecified: Secondary | ICD-10-CM | POA: Diagnosis not present

## 2023-02-13 DIAGNOSIS — J4489 Other specified chronic obstructive pulmonary disease: Secondary | ICD-10-CM | POA: Diagnosis not present

## 2023-02-13 DIAGNOSIS — D239 Other benign neoplasm of skin, unspecified: Secondary | ICD-10-CM | POA: Diagnosis not present

## 2023-02-13 DIAGNOSIS — I110 Hypertensive heart disease with heart failure: Secondary | ICD-10-CM | POA: Diagnosis not present

## 2023-02-13 DIAGNOSIS — I509 Heart failure, unspecified: Secondary | ICD-10-CM | POA: Diagnosis not present

## 2023-02-14 DIAGNOSIS — J4489 Other specified chronic obstructive pulmonary disease: Secondary | ICD-10-CM | POA: Diagnosis not present

## 2023-02-14 DIAGNOSIS — F331 Major depressive disorder, recurrent, moderate: Secondary | ICD-10-CM | POA: Diagnosis not present

## 2023-02-14 DIAGNOSIS — I110 Hypertensive heart disease with heart failure: Secondary | ICD-10-CM | POA: Diagnosis not present

## 2023-02-14 DIAGNOSIS — I509 Heart failure, unspecified: Secondary | ICD-10-CM | POA: Diagnosis not present

## 2023-02-14 DIAGNOSIS — F419 Anxiety disorder, unspecified: Secondary | ICD-10-CM | POA: Diagnosis not present

## 2023-02-14 DIAGNOSIS — N289 Disorder of kidney and ureter, unspecified: Secondary | ICD-10-CM | POA: Diagnosis not present

## 2023-02-14 DIAGNOSIS — I25118 Atherosclerotic heart disease of native coronary artery with other forms of angina pectoris: Secondary | ICD-10-CM | POA: Diagnosis not present

## 2023-02-14 DIAGNOSIS — D239 Other benign neoplasm of skin, unspecified: Secondary | ICD-10-CM | POA: Diagnosis not present

## 2023-02-14 DIAGNOSIS — I4891 Unspecified atrial fibrillation: Secondary | ICD-10-CM | POA: Diagnosis not present

## 2023-02-16 DIAGNOSIS — I5023 Acute on chronic systolic (congestive) heart failure: Secondary | ICD-10-CM | POA: Diagnosis not present

## 2023-02-16 DIAGNOSIS — R0600 Dyspnea, unspecified: Secondary | ICD-10-CM | POA: Diagnosis not present

## 2023-02-16 DIAGNOSIS — I110 Hypertensive heart disease with heart failure: Secondary | ICD-10-CM | POA: Diagnosis not present

## 2023-02-16 DIAGNOSIS — Z79899 Other long term (current) drug therapy: Secondary | ICD-10-CM | POA: Diagnosis not present

## 2023-02-16 DIAGNOSIS — I2510 Atherosclerotic heart disease of native coronary artery without angina pectoris: Secondary | ICD-10-CM | POA: Diagnosis not present

## 2023-02-16 DIAGNOSIS — R918 Other nonspecific abnormal finding of lung field: Secondary | ICD-10-CM | POA: Diagnosis not present

## 2023-02-16 DIAGNOSIS — E440 Moderate protein-calorie malnutrition: Secondary | ICD-10-CM | POA: Diagnosis not present

## 2023-02-16 DIAGNOSIS — J9811 Atelectasis: Secondary | ICD-10-CM | POA: Diagnosis not present

## 2023-02-16 DIAGNOSIS — J4489 Other specified chronic obstructive pulmonary disease: Secondary | ICD-10-CM | POA: Diagnosis not present

## 2023-02-16 DIAGNOSIS — R0602 Shortness of breath: Secondary | ICD-10-CM | POA: Diagnosis not present

## 2023-02-16 DIAGNOSIS — I771 Stricture of artery: Secondary | ICD-10-CM | POA: Diagnosis not present

## 2023-02-16 DIAGNOSIS — I4891 Unspecified atrial fibrillation: Secondary | ICD-10-CM | POA: Diagnosis not present

## 2023-02-16 DIAGNOSIS — I252 Old myocardial infarction: Secondary | ICD-10-CM | POA: Diagnosis not present

## 2023-02-16 DIAGNOSIS — I700 Atherosclerosis of aorta: Secondary | ICD-10-CM | POA: Diagnosis not present

## 2023-02-16 DIAGNOSIS — I509 Heart failure, unspecified: Secondary | ICD-10-CM | POA: Diagnosis not present

## 2023-02-16 DIAGNOSIS — K8020 Calculus of gallbladder without cholecystitis without obstruction: Secondary | ICD-10-CM | POA: Diagnosis not present

## 2023-02-16 DIAGNOSIS — Z7901 Long term (current) use of anticoagulants: Secondary | ICD-10-CM | POA: Diagnosis not present

## 2023-02-16 DIAGNOSIS — R54 Age-related physical debility: Secondary | ICD-10-CM | POA: Diagnosis not present

## 2023-02-16 DIAGNOSIS — J90 Pleural effusion, not elsewhere classified: Secondary | ICD-10-CM | POA: Diagnosis not present

## 2023-02-17 DIAGNOSIS — R54 Age-related physical debility: Secondary | ICD-10-CM | POA: Diagnosis not present

## 2023-02-17 DIAGNOSIS — I5023 Acute on chronic systolic (congestive) heart failure: Secondary | ICD-10-CM | POA: Diagnosis not present

## 2023-02-17 DIAGNOSIS — I4891 Unspecified atrial fibrillation: Secondary | ICD-10-CM | POA: Diagnosis not present

## 2023-02-17 DIAGNOSIS — E440 Moderate protein-calorie malnutrition: Secondary | ICD-10-CM | POA: Diagnosis not present

## 2023-02-17 DIAGNOSIS — I252 Old myocardial infarction: Secondary | ICD-10-CM | POA: Diagnosis not present

## 2023-02-17 DIAGNOSIS — Z79899 Other long term (current) drug therapy: Secondary | ICD-10-CM | POA: Diagnosis not present

## 2023-02-18 DIAGNOSIS — I4891 Unspecified atrial fibrillation: Secondary | ICD-10-CM | POA: Diagnosis not present

## 2023-02-18 DIAGNOSIS — I5023 Acute on chronic systolic (congestive) heart failure: Secondary | ICD-10-CM | POA: Diagnosis not present

## 2023-02-18 DIAGNOSIS — Z7901 Long term (current) use of anticoagulants: Secondary | ICD-10-CM | POA: Diagnosis not present

## 2023-02-18 DIAGNOSIS — E440 Moderate protein-calorie malnutrition: Secondary | ICD-10-CM | POA: Diagnosis not present

## 2023-02-18 DIAGNOSIS — I252 Old myocardial infarction: Secondary | ICD-10-CM | POA: Diagnosis not present

## 2023-02-18 DIAGNOSIS — Z79899 Other long term (current) drug therapy: Secondary | ICD-10-CM | POA: Diagnosis not present

## 2023-02-18 DIAGNOSIS — R54 Age-related physical debility: Secondary | ICD-10-CM | POA: Diagnosis not present

## 2023-02-18 DIAGNOSIS — J449 Chronic obstructive pulmonary disease, unspecified: Secondary | ICD-10-CM | POA: Diagnosis not present

## 2023-02-19 DIAGNOSIS — R42 Dizziness and giddiness: Secondary | ICD-10-CM | POA: Diagnosis not present

## 2023-02-19 DIAGNOSIS — F419 Anxiety disorder, unspecified: Secondary | ICD-10-CM | POA: Diagnosis not present

## 2023-02-19 DIAGNOSIS — R630 Anorexia: Secondary | ICD-10-CM | POA: Diagnosis not present

## 2023-02-19 DIAGNOSIS — I5022 Chronic systolic (congestive) heart failure: Secondary | ICD-10-CM | POA: Diagnosis not present

## 2023-02-19 DIAGNOSIS — R64 Cachexia: Secondary | ICD-10-CM | POA: Diagnosis not present

## 2023-02-19 DIAGNOSIS — E440 Moderate protein-calorie malnutrition: Secondary | ICD-10-CM | POA: Diagnosis not present

## 2023-02-19 DIAGNOSIS — M6281 Muscle weakness (generalized): Secondary | ICD-10-CM | POA: Diagnosis not present

## 2023-02-19 DIAGNOSIS — R634 Abnormal weight loss: Secondary | ICD-10-CM | POA: Diagnosis not present

## 2023-02-21 DIAGNOSIS — D239 Other benign neoplasm of skin, unspecified: Secondary | ICD-10-CM | POA: Diagnosis not present

## 2023-02-21 DIAGNOSIS — I4891 Unspecified atrial fibrillation: Secondary | ICD-10-CM | POA: Diagnosis not present

## 2023-02-21 DIAGNOSIS — I25118 Atherosclerotic heart disease of native coronary artery with other forms of angina pectoris: Secondary | ICD-10-CM | POA: Diagnosis not present

## 2023-02-21 DIAGNOSIS — F419 Anxiety disorder, unspecified: Secondary | ICD-10-CM | POA: Diagnosis not present

## 2023-02-21 DIAGNOSIS — N289 Disorder of kidney and ureter, unspecified: Secondary | ICD-10-CM | POA: Diagnosis not present

## 2023-02-21 DIAGNOSIS — F331 Major depressive disorder, recurrent, moderate: Secondary | ICD-10-CM | POA: Diagnosis not present

## 2023-02-21 DIAGNOSIS — J4489 Other specified chronic obstructive pulmonary disease: Secondary | ICD-10-CM | POA: Diagnosis not present

## 2023-02-21 DIAGNOSIS — I509 Heart failure, unspecified: Secondary | ICD-10-CM | POA: Diagnosis not present

## 2023-02-21 DIAGNOSIS — I110 Hypertensive heart disease with heart failure: Secondary | ICD-10-CM | POA: Diagnosis not present

## 2023-03-02 DIAGNOSIS — I4891 Unspecified atrial fibrillation: Secondary | ICD-10-CM | POA: Diagnosis not present

## 2023-03-02 DIAGNOSIS — I110 Hypertensive heart disease with heart failure: Secondary | ICD-10-CM | POA: Diagnosis not present

## 2023-03-02 DIAGNOSIS — J449 Chronic obstructive pulmonary disease, unspecified: Secondary | ICD-10-CM | POA: Diagnosis not present

## 2023-03-02 DIAGNOSIS — R9431 Abnormal electrocardiogram [ECG] [EKG]: Secondary | ICD-10-CM | POA: Diagnosis not present

## 2023-03-02 DIAGNOSIS — I2510 Atherosclerotic heart disease of native coronary artery without angina pectoris: Secondary | ICD-10-CM | POA: Diagnosis not present

## 2023-03-02 DIAGNOSIS — F419 Anxiety disorder, unspecified: Secondary | ICD-10-CM | POA: Diagnosis not present

## 2023-03-02 DIAGNOSIS — I517 Cardiomegaly: Secondary | ICD-10-CM | POA: Diagnosis not present

## 2023-03-02 DIAGNOSIS — I509 Heart failure, unspecified: Secondary | ICD-10-CM | POA: Diagnosis not present

## 2023-03-02 DIAGNOSIS — I700 Atherosclerosis of aorta: Secondary | ICD-10-CM | POA: Diagnosis not present

## 2023-03-02 DIAGNOSIS — I4510 Unspecified right bundle-branch block: Secondary | ICD-10-CM | POA: Diagnosis not present

## 2023-03-02 DIAGNOSIS — R0600 Dyspnea, unspecified: Secondary | ICD-10-CM | POA: Diagnosis not present

## 2023-03-02 DIAGNOSIS — R0602 Shortness of breath: Secondary | ICD-10-CM | POA: Diagnosis not present

## 2023-03-06 DIAGNOSIS — Z043 Encounter for examination and observation following other accident: Secondary | ICD-10-CM | POA: Diagnosis not present

## 2023-03-06 DIAGNOSIS — I110 Hypertensive heart disease with heart failure: Secondary | ICD-10-CM | POA: Diagnosis not present

## 2023-03-06 DIAGNOSIS — J811 Chronic pulmonary edema: Secondary | ICD-10-CM | POA: Diagnosis not present

## 2023-03-06 DIAGNOSIS — J449 Chronic obstructive pulmonary disease, unspecified: Secondary | ICD-10-CM | POA: Diagnosis not present

## 2023-03-06 DIAGNOSIS — Z87891 Personal history of nicotine dependence: Secondary | ICD-10-CM | POA: Diagnosis not present

## 2023-03-06 DIAGNOSIS — I672 Cerebral atherosclerosis: Secondary | ICD-10-CM | POA: Diagnosis not present

## 2023-03-06 DIAGNOSIS — S0003XA Contusion of scalp, initial encounter: Secondary | ICD-10-CM | POA: Diagnosis not present

## 2023-03-06 DIAGNOSIS — R0602 Shortness of breath: Secondary | ICD-10-CM | POA: Diagnosis not present

## 2023-03-06 DIAGNOSIS — I2510 Atherosclerotic heart disease of native coronary artery without angina pectoris: Secondary | ICD-10-CM | POA: Diagnosis not present

## 2023-03-06 DIAGNOSIS — Z20822 Contact with and (suspected) exposure to covid-19: Secondary | ICD-10-CM | POA: Diagnosis not present

## 2023-03-06 DIAGNOSIS — R059 Cough, unspecified: Secondary | ICD-10-CM | POA: Diagnosis not present

## 2023-03-06 DIAGNOSIS — R918 Other nonspecific abnormal finding of lung field: Secondary | ICD-10-CM | POA: Diagnosis not present

## 2023-03-06 DIAGNOSIS — J90 Pleural effusion, not elsewhere classified: Secondary | ICD-10-CM | POA: Diagnosis not present

## 2023-03-06 DIAGNOSIS — I509 Heart failure, unspecified: Secondary | ICD-10-CM | POA: Diagnosis not present

## 2023-03-06 DIAGNOSIS — S3993XA Unspecified injury of pelvis, initial encounter: Secondary | ICD-10-CM | POA: Diagnosis not present

## 2023-03-06 DIAGNOSIS — Z96641 Presence of right artificial hip joint: Secondary | ICD-10-CM | POA: Diagnosis not present

## 2023-03-06 DIAGNOSIS — Z91012 Allergy to eggs: Secondary | ICD-10-CM | POA: Diagnosis not present

## 2023-03-07 DIAGNOSIS — S0003XA Contusion of scalp, initial encounter: Secondary | ICD-10-CM | POA: Diagnosis not present

## 2023-03-07 DIAGNOSIS — S0181XA Laceration without foreign body of other part of head, initial encounter: Secondary | ICD-10-CM | POA: Diagnosis not present

## 2023-03-07 DIAGNOSIS — J811 Chronic pulmonary edema: Secondary | ICD-10-CM | POA: Diagnosis not present

## 2023-03-07 DIAGNOSIS — I672 Cerebral atherosclerosis: Secondary | ICD-10-CM | POA: Diagnosis not present

## 2023-03-07 DIAGNOSIS — I4891 Unspecified atrial fibrillation: Secondary | ICD-10-CM | POA: Diagnosis not present

## 2023-03-07 DIAGNOSIS — I499 Cardiac arrhythmia, unspecified: Secondary | ICD-10-CM | POA: Diagnosis not present

## 2023-03-07 DIAGNOSIS — I10 Essential (primary) hypertension: Secondary | ICD-10-CM | POA: Diagnosis not present

## 2023-03-07 DIAGNOSIS — R918 Other nonspecific abnormal finding of lung field: Secondary | ICD-10-CM | POA: Diagnosis not present

## 2023-03-07 DIAGNOSIS — S3993XA Unspecified injury of pelvis, initial encounter: Secondary | ICD-10-CM | POA: Diagnosis not present

## 2023-03-07 DIAGNOSIS — R609 Edema, unspecified: Secondary | ICD-10-CM | POA: Diagnosis not present

## 2023-03-07 DIAGNOSIS — J90 Pleural effusion, not elsewhere classified: Secondary | ICD-10-CM | POA: Diagnosis not present

## 2023-03-07 DIAGNOSIS — S0990XA Unspecified injury of head, initial encounter: Secondary | ICD-10-CM | POA: Diagnosis not present

## 2023-03-07 DIAGNOSIS — R000 Tachycardia, unspecified: Secondary | ICD-10-CM | POA: Diagnosis not present

## 2023-03-07 DIAGNOSIS — Z043 Encounter for examination and observation following other accident: Secondary | ICD-10-CM | POA: Diagnosis not present

## 2023-03-07 DIAGNOSIS — Z96641 Presence of right artificial hip joint: Secondary | ICD-10-CM | POA: Diagnosis not present

## 2023-03-07 DIAGNOSIS — Z7901 Long term (current) use of anticoagulants: Secondary | ICD-10-CM | POA: Diagnosis not present

## 2023-03-07 DIAGNOSIS — Z87891 Personal history of nicotine dependence: Secondary | ICD-10-CM | POA: Diagnosis not present

## 2023-03-07 DIAGNOSIS — I959 Hypotension, unspecified: Secondary | ICD-10-CM | POA: Diagnosis not present

## 2023-03-07 DIAGNOSIS — I2510 Atherosclerotic heart disease of native coronary artery without angina pectoris: Secondary | ICD-10-CM | POA: Diagnosis not present

## 2023-03-07 DIAGNOSIS — J449 Chronic obstructive pulmonary disease, unspecified: Secondary | ICD-10-CM | POA: Diagnosis not present

## 2023-03-07 DIAGNOSIS — I4811 Longstanding persistent atrial fibrillation: Secondary | ICD-10-CM | POA: Diagnosis not present

## 2023-03-11 DIAGNOSIS — I4811 Longstanding persistent atrial fibrillation: Secondary | ICD-10-CM | POA: Diagnosis not present

## 2023-03-11 DIAGNOSIS — R000 Tachycardia, unspecified: Secondary | ICD-10-CM | POA: Diagnosis not present

## 2023-03-11 DIAGNOSIS — Z79899 Other long term (current) drug therapy: Secondary | ICD-10-CM | POA: Diagnosis not present

## 2023-03-11 DIAGNOSIS — J441 Chronic obstructive pulmonary disease with (acute) exacerbation: Secondary | ICD-10-CM | POA: Diagnosis not present

## 2023-03-11 DIAGNOSIS — S2232XA Fracture of one rib, left side, initial encounter for closed fracture: Secondary | ICD-10-CM | POA: Diagnosis not present

## 2023-03-11 DIAGNOSIS — I4891 Unspecified atrial fibrillation: Secondary | ICD-10-CM | POA: Diagnosis not present

## 2023-03-11 DIAGNOSIS — R0602 Shortness of breath: Secondary | ICD-10-CM | POA: Diagnosis not present

## 2023-03-11 DIAGNOSIS — Z7901 Long term (current) use of anticoagulants: Secondary | ICD-10-CM | POA: Diagnosis not present

## 2023-03-11 DIAGNOSIS — R0781 Pleurodynia: Secondary | ICD-10-CM | POA: Diagnosis not present

## 2023-03-11 DIAGNOSIS — W19XXXA Unspecified fall, initial encounter: Secondary | ICD-10-CM | POA: Diagnosis not present

## 2023-03-17 DIAGNOSIS — I10 Essential (primary) hypertension: Secondary | ICD-10-CM | POA: Diagnosis not present

## 2023-03-17 DIAGNOSIS — Z79899 Other long term (current) drug therapy: Secondary | ICD-10-CM | POA: Diagnosis not present

## 2023-03-17 DIAGNOSIS — J449 Chronic obstructive pulmonary disease, unspecified: Secondary | ICD-10-CM | POA: Diagnosis not present

## 2023-03-17 DIAGNOSIS — Z4802 Encounter for removal of sutures: Secondary | ICD-10-CM | POA: Diagnosis not present

## 2023-03-17 DIAGNOSIS — Z7901 Long term (current) use of anticoagulants: Secondary | ICD-10-CM | POA: Diagnosis not present

## 2023-03-17 DIAGNOSIS — I2510 Atherosclerotic heart disease of native coronary artery without angina pectoris: Secondary | ICD-10-CM | POA: Diagnosis not present

## 2023-03-17 DIAGNOSIS — Z87891 Personal history of nicotine dependence: Secondary | ICD-10-CM | POA: Diagnosis not present

## 2023-03-18 ENCOUNTER — Telehealth: Payer: Self-pay | Admitting: Cardiology

## 2023-03-18 DIAGNOSIS — J441 Chronic obstructive pulmonary disease with (acute) exacerbation: Secondary | ICD-10-CM | POA: Diagnosis not present

## 2023-03-18 DIAGNOSIS — J90 Pleural effusion, not elsewhere classified: Secondary | ICD-10-CM | POA: Diagnosis not present

## 2023-03-18 DIAGNOSIS — F419 Anxiety disorder, unspecified: Secondary | ICD-10-CM | POA: Diagnosis not present

## 2023-03-18 DIAGNOSIS — R0602 Shortness of breath: Secondary | ICD-10-CM | POA: Diagnosis not present

## 2023-03-18 DIAGNOSIS — Z96641 Presence of right artificial hip joint: Secondary | ICD-10-CM | POA: Diagnosis not present

## 2023-03-18 DIAGNOSIS — R0989 Other specified symptoms and signs involving the circulatory and respiratory systems: Secondary | ICD-10-CM | POA: Diagnosis not present

## 2023-03-18 DIAGNOSIS — J811 Chronic pulmonary edema: Secondary | ICD-10-CM | POA: Diagnosis not present

## 2023-03-18 DIAGNOSIS — I110 Hypertensive heart disease with heart failure: Secondary | ICD-10-CM | POA: Diagnosis not present

## 2023-03-18 DIAGNOSIS — Z87891 Personal history of nicotine dependence: Secondary | ICD-10-CM | POA: Diagnosis not present

## 2023-03-18 DIAGNOSIS — I4891 Unspecified atrial fibrillation: Secondary | ICD-10-CM | POA: Diagnosis not present

## 2023-03-18 DIAGNOSIS — I2510 Atherosclerotic heart disease of native coronary artery without angina pectoris: Secondary | ICD-10-CM | POA: Diagnosis not present

## 2023-03-18 DIAGNOSIS — I509 Heart failure, unspecified: Secondary | ICD-10-CM | POA: Diagnosis not present

## 2023-03-18 NOTE — Telephone Encounter (Signed)
 Spoke with Dr. Rosamond who called to see if patient has a history of heart failure. Advised that per last visit, patient treated for A-fib, mild-mod Aortic Stenosis and HTN. Advised that per last echo done July 2023, EF was 60-65% with normal RV function. Advised that no diagnosis of heart failure per review from our office. Advised that 10/20/2022 ED and admission at [facility] list Heart Failure diagnosis.

## 2023-03-18 NOTE — Telephone Encounter (Signed)
 ER Doctor wanting to speak to nurse

## 2023-03-24 DIAGNOSIS — R079 Chest pain, unspecified: Secondary | ICD-10-CM | POA: Diagnosis not present

## 2023-03-24 DIAGNOSIS — R570 Cardiogenic shock: Secondary | ICD-10-CM | POA: Diagnosis not present

## 2023-03-24 DIAGNOSIS — I110 Hypertensive heart disease with heart failure: Secondary | ICD-10-CM | POA: Diagnosis not present

## 2023-03-24 DIAGNOSIS — I4510 Unspecified right bundle-branch block: Secondary | ICD-10-CM | POA: Diagnosis not present

## 2023-03-24 DIAGNOSIS — I4891 Unspecified atrial fibrillation: Secondary | ICD-10-CM | POA: Diagnosis not present

## 2023-03-24 DIAGNOSIS — I509 Heart failure, unspecified: Secondary | ICD-10-CM | POA: Diagnosis not present

## 2023-03-24 DIAGNOSIS — I2510 Atherosclerotic heart disease of native coronary artery without angina pectoris: Secondary | ICD-10-CM | POA: Diagnosis not present

## 2023-03-24 DIAGNOSIS — J811 Chronic pulmonary edema: Secondary | ICD-10-CM | POA: Diagnosis not present

## 2023-03-24 DIAGNOSIS — R000 Tachycardia, unspecified: Secondary | ICD-10-CM | POA: Diagnosis not present

## 2023-03-24 DIAGNOSIS — J90 Pleural effusion, not elsewhere classified: Secondary | ICD-10-CM | POA: Diagnosis not present

## 2023-03-24 DIAGNOSIS — R0602 Shortness of breath: Secondary | ICD-10-CM | POA: Diagnosis not present

## 2023-03-24 DIAGNOSIS — Z87891 Personal history of nicotine dependence: Secondary | ICD-10-CM | POA: Diagnosis not present

## 2023-03-24 DIAGNOSIS — E876 Hypokalemia: Secondary | ICD-10-CM | POA: Diagnosis not present

## 2023-03-24 DIAGNOSIS — I499 Cardiac arrhythmia, unspecified: Secondary | ICD-10-CM | POA: Diagnosis not present

## 2023-03-24 DIAGNOSIS — R0689 Other abnormalities of breathing: Secondary | ICD-10-CM | POA: Diagnosis not present

## 2023-03-24 DIAGNOSIS — J441 Chronic obstructive pulmonary disease with (acute) exacerbation: Secondary | ICD-10-CM | POA: Diagnosis not present

## 2023-03-26 ENCOUNTER — Encounter: Payer: Self-pay | Admitting: *Deleted

## 2023-03-26 ENCOUNTER — Telehealth: Payer: Self-pay | Admitting: *Deleted

## 2023-03-26 NOTE — Patient Outreach (Signed)
  Care Coordination   Initial Visit Note   03/26/2023 Name: Jackson Martin MRN: [id number] DOB: 01-26-1941  Jackson Martin is a 83 y.o. year old male who sees Kirstie Peri, MD for primary care. I spoke with  Barnie Alderman by phone today. Outreach due to many ED visits over the past 2 months. Patient is enrolled in hospice care through [facility]. He does not need Care Management services.   What matters to the patients health and wellness today?  Obtaining a hospital bed because he can breath better when propped up. Hospice is providing this.     SDOH assessments and interventions completed:  Yes  SDOH Interventions Today    Flowsheet Row Most Recent Value  SDOH Interventions   Transportation Interventions Intervention Not Indicated        Care Coordination Interventions:  Yes, provided  Interventions Today    Flowsheet Row Most Recent Value  Chronic Disease   Chronic disease during today's visit Chronic Obstructive Pulmonary Disease (COPD), Atrial Fibrillation (AFib)  General Interventions   General Interventions Discussed/Reviewed General Interventions Discussed, General Interventions Reviewed, Durable Medical Equipment (DME), Doctor Visits  Doctor Visits Discussed/Reviewed Doctor Visits Reviewed, Doctor Visits Discussed  [8 ED visits since 02/16/23]  Durable Medical Equipment (DME) Other, Oxygen, Wheelchair  [hospital bed being delivered by Hospice]  Education Interventions   Education Provided Provided Education  Provided Verbal Education On When to see the doctor, Other  [Reach out to [facility] Nurse with any questions/concerns regarding symptom managemetn]  Pharmacy Interventions   Pharmacy Dicussed/Reviewed Pharmacy Topics Discussed, Pharmacy Topics Reviewed, Medications and their functions  [taking medications as prescribed. Provided by hospice.]  Safety Interventions   Safety Discussed/Reviewed Safety Discussed, Safety Reviewed, Fall Risk, Home Safety  Home  Safety Assistive Devices  Advanced Directive Interventions   Advanced Directives Discussed/Reviewed End of Life  End of Life Hospice  [enrolled in Hospice services with [facility]]        Follow up plan: No further intervention required.   Encounter Outcome:  Patient Visit Completed   Demetrios Loll, RN, BSN [place]  [facility], [facility] RN Care Manager Direct Dial: [phone]

## 2023-03-30 DIAGNOSIS — I4891 Unspecified atrial fibrillation: Secondary | ICD-10-CM | POA: Diagnosis not present

## 2023-05-04 DEATH — deceased
